# Patient Record
Sex: Female | Born: 1945
Health system: Southern US, Community
[De-identification: ages and names within clinical notes are randomized; demographics above are authoritative.]

## PROBLEM LIST (undated history)

## (undated) DIAGNOSIS — C50919 Malignant neoplasm of unspecified site of unspecified female breast: Secondary | ICD-10-CM

## (undated) DIAGNOSIS — B029 Zoster without complications: Secondary | ICD-10-CM

## (undated) DIAGNOSIS — L910 Hypertrophic scar: Secondary | ICD-10-CM

## (undated) DIAGNOSIS — C801 Malignant (primary) neoplasm, unspecified: Secondary | ICD-10-CM

## (undated) DIAGNOSIS — K219 Gastro-esophageal reflux disease without esophagitis: Secondary | ICD-10-CM

## (undated) HISTORY — DX: Zoster without complications: B02.9

## (undated) HISTORY — DX: Malignant neoplasm of unspecified site of unspecified female breast: C50.919

## (undated) HISTORY — DX: Hypertrophic scar: L91.0

## (undated) HISTORY — PX: COLONOSCOPY: SHX174

---

## 1994-04-20 HISTORY — PX: ABDOMINAL HYSTERECTOMY: SHX81

## 2016-04-20 DIAGNOSIS — C50919 Malignant neoplasm of unspecified site of unspecified female breast: Secondary | ICD-10-CM

## 2016-04-20 HISTORY — DX: Malignant neoplasm of unspecified site of unspecified female breast: C50.919

## 2016-04-20 HISTORY — PX: MASTECTOMY: SHX3

## 2016-04-22 ENCOUNTER — Encounter: Payer: Self-pay | Admitting: Primary Care

## 2016-04-22 ENCOUNTER — Ambulatory Visit (INDEPENDENT_AMBULATORY_CARE_PROVIDER_SITE_OTHER): Payer: Medicare HMO | Admitting: Primary Care

## 2016-04-22 VITALS — BP 136/82 | HR 66 | Temp 98.0°F | Ht 61.25 in | Wt 231.4 lb

## 2016-04-22 DIAGNOSIS — L910 Hypertrophic scar: Secondary | ICD-10-CM | POA: Diagnosis not present

## 2016-04-22 DIAGNOSIS — R229 Localized swelling, mass and lump, unspecified: Secondary | ICD-10-CM

## 2016-04-22 NOTE — Patient Instructions (Signed)
The spot under your right arm is called a Keloid. This is nothing to worry about and is benign.  The spot on your leg is most likely a benign cyst.   Keep an eye on both spots and notify me if the change shape, size, colors.  Please schedule a physical with me within the next 3 months. You may also schedule a lab only appointment 3-4 days prior. We will discuss your lab results in detail during your physical.  It was a pleasure to meet you today! Please don't hesitate to call me with any questions. Welcome to [facility]!

## 2016-04-22 NOTE — Progress Notes (Signed)
Pre visit review using our clinic review tool, if applicable. No additional management support is needed unless otherwise documented below in the visit note. 

## 2016-04-22 NOTE — Progress Notes (Signed)
Subjective:    Patient ID: Monica Villanueva, female    DOB: November 18, 1945, 71 y.o.   MRN: [id number]  HPI  Ms. Montjoy is a 71 year old female who presents today to establish care and discuss the problems mentioned below. Will obtain old records. Her last physical was 10 years ago.   1) Lower Extremity Knot: Pain with knot that has been present for the past 2 years. She's noticed a knot to her right anterior lower extremity just distal to her knee. Several years ago she hit that spot with a cabinet, but didn't notice the knot for several years later. She has not been bothered by this knot overall but has noticed discomfort off and on for the past several years.   2) Skin Mass: Located to right axilla that she first noticed 2 years ago. She's had discomfort intermittently. She's noticed a gradual increase in size. She's also noticed the mass becoming a lighter color over the years. She denies changes in shape or red/blue discoloration. She applies a cream intermittently.   Review of Systems  Constitutional: Negative for fatigue and unexpected weight change.  Musculoskeletal: Negative for myalgias.  Skin: Positive for color change. Negative for rash.       Growth under right axilla, knot to right lower extremity.       No past medical history on file.   Social History   Social History  . Marital status: Widowed    Spouse name: N/A  . Number of children: N/A  . Years of education: N/A   Occupational History  . Not on file.   Social History Main Topics  . Smoking status: Never Smoker  . Smokeless tobacco: Not on file  . Alcohol use Yes  . Drug use: Unknown  . Sexual activity: Not on file   Other Topics Concern  . Not on file   Social History Narrative   Widowed.   2 children, 4 grandchildren.   Retired. Previously worked at [employer].   Enjoys writing, playing on the key boards.    Past Surgical History:  Procedure Laterality Date  . ABDOMINAL HYSTERECTOMY   1996    Family History  Problem Relation Age of Onset  . Hypertension Mother   . Hypertension Sister     No Known Allergies  No current outpatient prescriptions on file prior to visit.   No current facility-administered medications on file prior to visit.     BP 136/82   Pulse 66   Temp 98 F (36.7 C) (Oral)   Ht 5' 1.25" (1.556 m)   Wt 231 lb 6.4 oz (105 kg)   SpO2 93%   BMI 43.37 kg/m    Objective:   Physical Exam  Constitutional: She is oriented to person, place, and time. She appears well-nourished.  Neck: Neck supple.  Cardiovascular: Normal rate and regular rhythm.   Pulmonary/Chest: Effort normal and breath sounds normal.  Neurological: She is alert and oriented to person, place, and time.  Skin: Skin is warm and dry.  4 cm in length light Reddick keloid to right medial axilla. Non tender. No erythema. 1 cm circular superficial, mobile, encapsuled, soft mass representing benign cyst to right lower extremity distal to knee.          Assessment & Plan:  Keloid:  Mass to right axilla x several years. Gradual enlargement over the years, no recent changes. Changes in color after application of cortisone cream. Exam today consistent for Keloid.  Discussed this is a benign finding and nothing to worry about. Discussed to notify if any changes in size, shape, color.  Skin Mass:  Located to right lower extremity. No change in cyst for years. Exam today consistent for benign cyst. Discussed to notify for any changes. She verbalized understanding.  Sheral Flow, NP

## 2016-05-13 ENCOUNTER — Encounter: Payer: Self-pay | Admitting: Primary Care

## 2016-05-13 ENCOUNTER — Ambulatory Visit (INDEPENDENT_AMBULATORY_CARE_PROVIDER_SITE_OTHER): Payer: Medicare HMO | Admitting: Primary Care

## 2016-05-13 VITALS — BP 134/80 | HR 72 | Temp 97.8°F | Ht 61.25 in | Wt 227.8 lb

## 2016-05-13 DIAGNOSIS — Z23 Encounter for immunization: Secondary | ICD-10-CM

## 2016-05-13 DIAGNOSIS — Z1231 Encounter for screening mammogram for malignant neoplasm of breast: Secondary | ICD-10-CM

## 2016-05-13 DIAGNOSIS — Z0000 Encounter for general adult medical examination without abnormal findings: Secondary | ICD-10-CM | POA: Diagnosis not present

## 2016-05-13 DIAGNOSIS — E2839 Other primary ovarian failure: Secondary | ICD-10-CM | POA: Diagnosis not present

## 2016-05-13 DIAGNOSIS — Z1159 Encounter for screening for other viral diseases: Secondary | ICD-10-CM | POA: Diagnosis not present

## 2016-05-13 DIAGNOSIS — Z6841 Body Mass Index (BMI) 40.0 and over, adult: Secondary | ICD-10-CM

## 2016-05-13 DIAGNOSIS — Z1239 Encounter for other screening for malignant neoplasm of breast: Secondary | ICD-10-CM

## 2016-05-13 LAB — COMPREHENSIVE METABOLIC PANEL
ALK PHOS: 58 U/L (ref 39–117)
ALT: 10 U/L (ref 0–35)
AST: 15 U/L (ref 0–37)
Albumin: 4.3 g/dL (ref 3.5–5.2)
BILIRUBIN TOTAL: 0.5 mg/dL (ref 0.2–1.2)
BUN: 12 mg/dL (ref 6–23)
CALCIUM: 9.3 mg/dL (ref 8.4–10.5)
CO2: 30 mEq/L (ref 19–32)
Chloride: 106 mEq/L (ref 96–112)
Creatinine, Ser: 0.84 mg/dL (ref 0.40–1.20)
GFR: 86.07 mL/min (ref >60.00)
Glucose, Bld: 109 mg/dL — ABNORMAL HIGH (ref 70–99)
Potassium: 3.7 mEq/L (ref 3.5–5.1)
Sodium: 141 mEq/L (ref 135–145)
TOTAL PROTEIN: 8 g/dL (ref 6.0–8.3)

## 2016-05-13 LAB — LIPID PANEL
CHOLESTEROL: 217 mg/dL — AB (ref 0–200)
HDL: 48.5 mg/dL (ref >39.00)
LDL Cholesterol: 157 mg/dL — ABNORMAL HIGH (ref 0–99)
NonHDL: 168.04
TRIGLYCERIDES: 55 mg/dL (ref 0.0–149.0)
Total CHOL/HDL Ratio: 4
VLDL: 11 mg/dL (ref 0.0–40.0)

## 2016-05-13 LAB — HEMOGLOBIN A1C: HEMOGLOBIN A1C: 5.5 % (ref 4.6–6.5)

## 2016-05-13 NOTE — Assessment & Plan Note (Signed)
She's working on making improvements in diet. Encouraged her to continue exercising. Check A1C and lipids today.

## 2016-05-13 NOTE — Assessment & Plan Note (Signed)
Declines all vaccinations including influenza, pneumonia, zostavax. Discussed the importance of all immunizations. Mammogram and bone density testing due, ordered. Due for colon cancer screening, she was signed up for Cologuard today. Overall fair diet, discussed to make improvements; continue exercising. Exam unremarkable, labs pending.   I have personally reviewed and have noted: 1. The patient's medical and social history 2. Their use of alcohol, tobacco or illicit drugs 3. Their current medications and supplements 4. The patient's functional ability including ADL's, fall  risks, home safety risks and hearing or visual  impairment. 5. Diet and physical activities 6. Evidence for depression or mood disorder

## 2016-05-13 NOTE — Progress Notes (Signed)
Patient ID: Monica Villanueva, female   DOB: Aug 20, 1945, 71 y.o.   MRN: [id number]  HPI: [name] presents today for her annual wellness visit.  Past Medical History:  Diagnosis Date  . Keloid of skin    right axilla    No current outpatient prescriptions on file.   No current facility-administered medications for this visit.     No Known Allergies  Family History  Problem Relation Age of Onset  . Hypertension Mother   . Hypertension Sister     Social History   Social History  . Marital status: Widowed    Spouse name: N/A  . Number of children: N/A  . Years of education: N/A   Occupational History  . Not on file.   Social History Main Topics  . Smoking status: Never Smoker  . Smokeless tobacco: Not on file  . Alcohol use Yes  . Drug use: Unknown  . Sexual activity: Not on file   Other Topics Concern  . Not on file   Social History Narrative   Widowed.   2 children, 4 grandchildren.   Retired. Previously worked at [employer].   Enjoys writing, playing on the key boards.    Hospitiliaztions: None  Health Maintenance:    Flu: Declines  Tetanus: Completed over 10 years ago  Pneumovax: Never completed  Prevnar: Never completed  Zostavax: Declines  Bone Density: Never completed  Colonoscopy: Completed over 10 years ago, interested in Cologuard.  Eye Doctor: Completed in 2012  Dental Exam: Completes every 1-2 years, due soon.  Mammogram: Has not completed recommended   Pap: Hysterectomy   Hep C Screening: Never completed     Providers: Alma Friendly, PCP.   I have personally reviewed and have noted: 1. The patient's medical and social history 2. Their use of alcohol, tobacco or illicit drugs 3. Their current medications and supplements 4. The patient's functional ability including ADL's, fall risks, home safety risks  and hearing or visual impairment. 5. Diet and physical activities 6. Evidence for depression or mood  disorder  Subjective:   Review of Systems:   Constitutional: Denies fever, malaise, fatigue, headache or abrupt weight changes.  HEENT: Denies eye pain, eye redness, ear pain, ringing in the ears, wax buildup, runny nose, nasal congestion, bloody nose, or sore throat. Respiratory: Denies difficulty breathing, shortness of breath, cough or sputum production.   Cardiovascular: Denies chest pain, chest tightness, palpitations or swelling in the hands or feet.  Gastrointestinal: Denies abdominal pain, bloating, constipation, diarrhea or blood in the stool.  GU: Denies urgency, frequency, pain with urination, burning sensation, blood in urine, odor or discharge. Musculoskeletal: Denies decrease in range of motion, difficulty with gait, muscle pain or joint pain and swelling.  Skin: Denies redness, rashes, lesions or ulcercations.  Neurological: Denies dizziness, difficulty with memory, difficulty with speech or problems with balance and coordination.  Psychiatric: Denies concerns for anxiety or depression.   No other specific complaints in a complete review of systems (except as listed in HPI above).  Objective:  PE:   BP 134/80   Pulse 72   Temp 97.8 F (36.6 C) (Oral)   Ht 5' 1.25" (1.556 m)   Wt 227 lb 12.8 oz (103.3 kg)   SpO2 93%   BMI 42.69 kg/m  Wt Readings from Last 3 Encounters:  05/13/16 227 lb 12.8 oz (103.3 kg)  04/22/16 231 lb 6.4 oz (105 kg)    General: Appears their stated age, well developed, well  nourished in NAD. Skin: Warm, dry and intact. No rashes, lesions or ulcerations noted. HEENT: Head: normal shape and size; Eyes: sclera white, no icterus, conjunctiva pink, PERRLA and EOMs intact; Ears: Tm's gray and intact, normal light reflex; Nose: mucosa pink and moist, septum midline; Throat/Mouth: Teeth present, mucosa pink and moist, no exudate, lesions or ulcerations noted.  Neck: Normal range of motion. Neck supple, trachea midline. No massses, lumps or  thyromegaly present.  Cardiovascular: Normal rate and rhythm. S1,S2 noted.  No murmur, rubs or gallops noted. No JVD or BLE edema. No carotid bruits noted. Pulmonary/Chest: Normal effort and positive vesicular breath sounds. No respiratory distress. No wheezes, rales or ronchi noted.  Abdomen: Soft and nontender. Normal bowel sounds, no bruits noted. No distention or masses noted. Liver, spleen and kidneys non palpable. Musculoskeletal: Normal range of motion. No signs of joint swelling. No difficulty with gait.  Neurological: Alert and oriented. Cranial nerves II-XII intact. Coordination normal. +DTRs bilaterally. Psychiatric: Mood and affect normal. Behavior is normal. Judgment and thought content normal.    BMET No results found for: NA, K, CL, CO2, GLUCOSE, BUN, CREATININE, CALCIUM, GFRNONAA, GFRAA  Lipid Panel  No results found for: CHOL, TRIG, HDL, CHOLHDL, VLDL, LDLCALC  CBC No results found for: WBC, RBC, HGB, HCT, PLT, MCV, MCH, MCHC, RDW, LYMPHSABS, MONOABS, EOSABS, BASOSABS  Hgb A1C No results found for: HGBA1C    Assessment and Plan:   Medicare Annual Wellness Visit:  Diet: She endorses a fair diet Breakfast: Skips, boiled egg, fruit Lunch: Salads with protein, baked chicken Dinner: Baked/broiled lean meats, limited starches Snacks: Rarely Desserts: Rarely  Beverages: Coffee, water, occasional sodas Physical activity: Active. She works out on her chair gym several days weekly. Depression/mood screen: Negative Hearing: Intact to whispered voice Visual acuity: Grossly normal, performs annual eye exam  ADLs: Capable Fall risk: None Home safety: Good Cognitive evaluation: Intact to orientation, naming, recall and repetition EOL planning: Adv directives not completed, daughter [name] would be [employer].  Preventative Medicine: Declines all vaccinations including influenza, pneumonia, zostavax. Discussed the importance of all immunizations. Mammogram and bone density  testing due, ordered. Due for colon cancer screening, she was signed up for Cologuard today. Overall fair diet, discussed to make improvements; continue exercising. Exam unremarkable, labs pending.   Next appointment: Follow up in 1 year.

## 2016-05-13 NOTE — Progress Notes (Signed)
Pre visit review using our clinic review tool, if applicable. No additional management support is needed unless otherwise documented below in the visit note. 

## 2016-05-13 NOTE — Addendum Note (Signed)
Addended by: Jacqualin Combes on: 05/13/2016 10:09 AM   Modules accepted: Orders

## 2016-05-13 NOTE — Patient Instructions (Signed)
Complete lab work prior to leaving today. I will notify you of your results once received.   Call and schedule your mammogram and bone density through the [facility] at [place] your efforts towards a healthy diet. Increase vegetables, fruit, whole grains, water.  Continue exercising. You should be getting 150 minutes of moderate intensity exercise weekly.  We will notify you of the Cologurad (cancer screening) test once received.  I recommend an annual flu shot and the pneumonia shot series. Please think about these.  Follow up in 1 year for an annual physical or sooner if needed.  It was a pleasure to see you today!

## 2016-05-14 LAB — HEPATITIS C ANTIBODY: HCV AB: NEGATIVE

## 2016-05-19 ENCOUNTER — Encounter: Payer: Self-pay | Admitting: Primary Care

## 2016-05-19 DIAGNOSIS — Z1212 Encounter for screening for malignant neoplasm of rectum: Secondary | ICD-10-CM | POA: Diagnosis not present

## 2016-05-19 DIAGNOSIS — Z1211 Encounter for screening for malignant neoplasm of colon: Secondary | ICD-10-CM | POA: Diagnosis not present

## 2016-05-19 LAB — COLOGUARD: Cologuard: NEGATIVE

## 2016-05-27 ENCOUNTER — Telehealth: Payer: Self-pay | Admitting: Primary Care

## 2016-05-27 NOTE — Telephone Encounter (Signed)
Please notify patient that her Cologuard specimen was negative and not suspicious for colon cancer. We will repeat this in 3 years.

## 2016-05-28 NOTE — Telephone Encounter (Signed)
Sending letter with results and Kate's comments for patient. 

## 2016-06-23 ENCOUNTER — Ambulatory Visit
Admission: RE | Admit: 2016-06-23 | Discharge: 2016-06-23 | Disposition: A | Discharge status: Home / Self Care | Payer: Medicare HMO | Source: Ambulatory Visit | Attending: Primary Care | Admitting: Primary Care

## 2016-06-23 DIAGNOSIS — M8588 Other specified disorders of bone density and structure, other site: Secondary | ICD-10-CM | POA: Diagnosis not present

## 2016-06-23 DIAGNOSIS — R928 Other abnormal and inconclusive findings on diagnostic imaging of breast: Secondary | ICD-10-CM | POA: Diagnosis not present

## 2016-06-23 DIAGNOSIS — Z1239 Encounter for other screening for malignant neoplasm of breast: Secondary | ICD-10-CM

## 2016-06-23 DIAGNOSIS — E2839 Other primary ovarian failure: Secondary | ICD-10-CM | POA: Diagnosis present

## 2016-06-23 DIAGNOSIS — Z1231 Encounter for screening mammogram for malignant neoplasm of breast: Secondary | ICD-10-CM | POA: Diagnosis not present

## 2016-06-23 DIAGNOSIS — M85851 Other specified disorders of bone density and structure, right thigh: Secondary | ICD-10-CM | POA: Diagnosis not present

## 2016-06-24 ENCOUNTER — Other Ambulatory Visit: Payer: Self-pay | Admitting: Primary Care

## 2016-06-24 DIAGNOSIS — N6320 Unspecified lump in the left breast, unspecified quadrant: Secondary | ICD-10-CM

## 2016-06-24 DIAGNOSIS — R921 Mammographic calcification found on diagnostic imaging of breast: Secondary | ICD-10-CM

## 2016-06-24 DIAGNOSIS — R928 Other abnormal and inconclusive findings on diagnostic imaging of breast: Secondary | ICD-10-CM

## 2016-06-24 DIAGNOSIS — N6310 Unspecified lump in the right breast, unspecified quadrant: Secondary | ICD-10-CM

## 2016-07-10 ENCOUNTER — Ambulatory Visit
Admission: RE | Admit: 2016-07-10 | Discharge: 2016-07-10 | Disposition: A | Discharge status: Home / Self Care | Payer: Medicare HMO | Source: Ambulatory Visit | Attending: Primary Care | Admitting: Primary Care

## 2016-07-10 ENCOUNTER — Other Ambulatory Visit: Payer: Self-pay | Admitting: Primary Care

## 2016-07-10 DIAGNOSIS — N6310 Unspecified lump in the right breast, unspecified quadrant: Secondary | ICD-10-CM

## 2016-07-10 DIAGNOSIS — R928 Other abnormal and inconclusive findings on diagnostic imaging of breast: Secondary | ICD-10-CM

## 2016-07-10 DIAGNOSIS — N6312 Unspecified lump in the right breast, upper inner quadrant: Secondary | ICD-10-CM | POA: Diagnosis not present

## 2016-07-10 DIAGNOSIS — R921 Mammographic calcification found on diagnostic imaging of breast: Secondary | ICD-10-CM | POA: Insufficient documentation

## 2016-07-10 DIAGNOSIS — N6001 Solitary cyst of right breast: Secondary | ICD-10-CM | POA: Insufficient documentation

## 2016-07-10 DIAGNOSIS — N6002 Solitary cyst of left breast: Secondary | ICD-10-CM | POA: Diagnosis not present

## 2016-07-10 DIAGNOSIS — N6320 Unspecified lump in the left breast, unspecified quadrant: Secondary | ICD-10-CM

## 2016-07-10 DIAGNOSIS — R920 Mammographic microcalcification found on diagnostic imaging of breast: Secondary | ICD-10-CM

## 2016-07-15 ENCOUNTER — Ambulatory Visit
Admission: RE | Admit: 2016-07-15 | Discharge: 2016-07-15 | Disposition: A | Discharge status: Home / Self Care | Payer: Medicare HMO | Source: Ambulatory Visit | Attending: Primary Care | Admitting: Primary Care

## 2016-07-15 DIAGNOSIS — C50911 Malignant neoplasm of unspecified site of right female breast: Secondary | ICD-10-CM | POA: Insufficient documentation

## 2016-07-15 DIAGNOSIS — N6310 Unspecified lump in the right breast, unspecified quadrant: Secondary | ICD-10-CM

## 2016-07-15 DIAGNOSIS — R928 Other abnormal and inconclusive findings on diagnostic imaging of breast: Secondary | ICD-10-CM

## 2016-07-15 DIAGNOSIS — N6312 Unspecified lump in the right breast, upper inner quadrant: Secondary | ICD-10-CM | POA: Diagnosis not present

## 2016-07-15 DIAGNOSIS — R920 Mammographic microcalcification found on diagnostic imaging of breast: Secondary | ICD-10-CM | POA: Diagnosis not present

## 2016-07-15 DIAGNOSIS — Z170 Estrogen receptor positive status [ER+]: Secondary | ICD-10-CM | POA: Diagnosis not present

## 2016-07-15 DIAGNOSIS — R921 Mammographic calcification found on diagnostic imaging of breast: Secondary | ICD-10-CM | POA: Diagnosis not present

## 2016-07-15 DIAGNOSIS — N6002 Solitary cyst of left breast: Secondary | ICD-10-CM

## 2016-07-15 DIAGNOSIS — C50211 Malignant neoplasm of upper-inner quadrant of right female breast: Secondary | ICD-10-CM | POA: Diagnosis not present

## 2016-07-15 DIAGNOSIS — C50511 Malignant neoplasm of lower-outer quadrant of right female breast: Secondary | ICD-10-CM | POA: Diagnosis not present

## 2016-07-15 HISTORY — PX: BREAST BIOPSY: SHX20

## 2016-07-15 HISTORY — PX: BREAST CYST ASPIRATION: SHX578

## 2016-07-21 ENCOUNTER — Encounter: Payer: Self-pay | Admitting: *Deleted

## 2016-07-21 NOTE — Progress Notes (Signed)
  Oncology Nurse Navigator Documentation  Navigator Location: [facility] (07/21/16 1500)   )Navigator Encounter Type: Introductory phone call (07/21/16 1500)   Abnormal Finding Date: 07/10/16 (07/21/16 1500) Confirmed Diagnosis Date: 07/15/16 (07/21/16 1500)                   Barriers/Navigation Needs: Education;Coordination of Care (07/21/16 1500)   Interventions: Coordination of Care (07/21/16 1500)    Called patient to establish navigation services and offer coordination of care.  I have scheduled her to see Dr. Tamala Julian for surgical consultation on 07/23/16 @8 :35 and Dr. Janese Banks for med/onc consultation on 07/31/16 @ 1:30.  I will take educational literature to be picked up at her appointment on the 5th.  She is agreeable.  She is to call if she has any questions or needs.        Acuity: Level 2 (07/21/16 1500)         Time Spent with Patient: 45 (07/21/16 1500)

## 2016-07-23 ENCOUNTER — Encounter: Payer: Self-pay | Admitting: *Deleted

## 2016-07-23 DIAGNOSIS — C50811 Malignant neoplasm of overlapping sites of right female breast: Secondary | ICD-10-CM | POA: Diagnosis not present

## 2016-07-23 NOTE — Progress Notes (Signed)
  Oncology Nurse Navigator Documentation  Navigator Location: [facility] (07/23/16 0900)   )Navigator Encounter Type: Other (07/23/16 0900)                         Barriers/Navigation Needs: Education (07/23/16 0900) Education: Understanding Cancer/ Treatment Options;Coping with Diagnosis/ Prognosis;Newly Diagnosed Cancer Education (07/23/16 0900)              Acuity: Level 2 (07/23/16 0900)    Met patient her daughter and son-in-law prior to her appointment with Dr. Tamala Julian.  Gave patient breast cancer educational literature, "My Breast Cancer Treatment Handbook" by Josephine Igo, RN.  Offered support.  She is to call if she has any questions or needs.     Time Spent with Patient: 30 (07/23/16 0900)

## 2016-07-24 ENCOUNTER — Encounter: Payer: Self-pay | Admitting: *Deleted

## 2016-07-24 NOTE — Progress Notes (Signed)
  Oncology Nurse Navigator Documentation  Navigator Location: [facility] (07/24/16 0900)   )Navigator Encounter Type: Telephone (07/24/16 0900) Telephone: Lahoma Crocker Call (07/24/16 0900)                                        Acuity: Level 2 (07/24/16 0900)    Called patient to follow-up after her surgical consultation.  No questions at this time.  She has medical oncology consult on 07/31/16 with Dr. Janese Banks.  Her2 status is still pending.  She is to call with any questions or needs.     Time Spent with Patient: 15 (07/24/16 0900)

## 2016-07-27 LAB — SURGICAL PATHOLOGY

## 2016-07-31 ENCOUNTER — Inpatient Hospital Stay: Payer: Medicare HMO | Attending: Oncology | Admitting: Oncology

## 2016-07-31 ENCOUNTER — Encounter: Payer: Self-pay | Admitting: Oncology

## 2016-07-31 VITALS — BP 167/83 | HR 64 | Temp 97.6°F | Resp 18 | Ht 61.0 in | Wt 229.3 lb

## 2016-07-31 DIAGNOSIS — C50911 Malignant neoplasm of unspecified site of right female breast: Secondary | ICD-10-CM

## 2016-07-31 DIAGNOSIS — Z170 Estrogen receptor positive status [ER+]: Secondary | ICD-10-CM | POA: Insufficient documentation

## 2016-07-31 DIAGNOSIS — Z6841 Body Mass Index (BMI) 40.0 and over, adult: Secondary | ICD-10-CM | POA: Diagnosis not present

## 2016-07-31 DIAGNOSIS — C50211 Malignant neoplasm of upper-inner quadrant of right female breast: Secondary | ICD-10-CM | POA: Diagnosis not present

## 2016-07-31 NOTE — Progress Notes (Signed)
Patient is here for new patient appointment, she has a list of concerns.  What kind of treatment will she receive? What schedule will she be on? How long will treatments be? How long will each treatment be? Will she need radiation therapy?

## 2016-07-31 NOTE — Progress Notes (Signed)
Hematology/Oncology Consult note [facility] [phone] [phone]  Patient Care Team: Pleas Koch, NP as PCP - General (Internal Medicine)   Name of the patient: Monica Villanueva  [id number]  1945-06-14    Reason for referral- multifocal right breast cancer   Referring physician- Dr. Alma Villanueva  Date of visit: 07/31/16   History of presenting illness- 1. Patient is a 71 year old female who underwent bilateral screening mammogram on 36 MT 18 which showed masses in bilateral breasts requiring further evaluation.  2. Diagnostic bilateral mammogram on 07/10/2016 showed:IMPRESSION:1. Findings suspicious for multicentric carcinoma in the right breast. There is a suspicious irregular mass in the upper inner quadrant at 2 o'clock position 7 cm from the nipple and there are extensive pleomorphic calcifications in the outer right breast, involving both the lower outer quadrant and upper outer quadrant. 2. Probable fibroadenoma 10:30 position upper-outer quadrant right breast. 3. Probable complicated cyst 3 o'clock retroareolar left breast.4. Probable sebaceous cyst left axilla.  3. Patient underwent core biopsy of both the right breast lesions as well as ultrasound-guided aspiration of left breast cyst. Pathology showed:  DIAGNOSIS:  A. BREAST, RIGHT, LOWER OUTER QUADRANT; STEREOTACTIC-GUIDED CORE BIOPSY:  - MICROINVASIVE LOBULAR CARCINOMA, 0.95 MM.  - DUCTAL CARCINOMA IN SITU, INTERMEDIATE NUCLEAR GRADE WITH FOCAL  NECROSIS, ASSOCIATED WITH CALCIFICATIONS.   Comment:  A single 0.95 mm focus of invasive lobular carcinoma, classic type, is  identified and confirmed by cytokeratin immunohistochemistry with  appropriately stained control. The tumor is arising in association with  DCIS which is present in several core fragments and probably represents  an extensive intraductal component. Classic invasive lobular carcinoma  is essentially  always ER positive and HER2 negative, and the small size  is associated with very low risk. Given the concurrent larger tumor,  biomarkers are performed only on part B at this time. If a larger  invasive lobular component is discovered at a later time, biomarkers can  be done as clinically indicated.   B. BREAST, RIGHT, 2 O'CLOCK, 7 CM FROM NIPPLE; ULTRASOUND-GUIDED CORE  BIOPSY:  - INVASIVE MAMMARY CARCINOMA, NO SPECIAL TYPE.   The following refers to tumor B only:  Size of invasive carcinoma: 13 mm  Histologic grade of invasive carcinoma: Grade 1    Glandular/tubular differentiation score: 2    Nuclear pleomorphism score: 2    Mitotic rate score: 1    Total score: 5   Ductal carcinoma in situ: Not identified  Lymphovascular invasion: Not identified  ER/PR/HER2: See below.   Comment:  The definitive grade on part B will be assigned on the excisional  specimen.  The findings on A and B were communicated to Monica Villanueva in the  [facility] on 07/16/16 at 11:25 AM. Read-back procedure  was performed.   HER2 FISH has been ordered on B1 because of equivocal IHC result and  will be reported in an addendum.   Breast Biomarker Reporting Template: ER, PR, HER2   BREAST BIOMARKER TESTS  Estrogen Receptor (ER) Status: POSITIVE, >90% nuclear staining  Progesterone Receptor (PgR) Status: POSITIVE, >90% nuclear staining  HER2 (by immunohistochemistry): Equivocal (Score 2+)  Percentage of cells with uniform intense complete membrane staining: 5%  FISH for her2 negative  4. At baseline patient is in good health and reports no significant comorbidities. She does not take any other medications except multivitamins. No family history of breast cancer. Menarche at the age of 29 and menopause in 9. She did not  breast-feed and used oral contraceptive pills remotely. She is G2 P2 L2.  ECOG PS- 0  Pain scale- 0   Review of systems- Review of Systems    Constitutional: Negative for chills, fever, malaise/fatigue and weight loss.  HENT: Negative for congestion, ear discharge and nosebleeds.   Eyes: Negative for blurred vision.  Respiratory: Negative for cough, hemoptysis, sputum production, shortness of breath and wheezing.   Cardiovascular: Negative for chest pain, palpitations, orthopnea and claudication.  Gastrointestinal: Negative for abdominal pain, blood in stool, constipation, diarrhea, heartburn, melena, nausea and vomiting.  Genitourinary: Negative for dysuria, flank pain, frequency, hematuria and urgency.  Musculoskeletal: Negative for back pain, joint pain and myalgias.  Skin: Negative for rash.  Neurological: Negative for dizziness, tingling, focal weakness, seizures, weakness and headaches.  Endo/Heme/Allergies: Does not bruise/bleed easily.  Psychiatric/Behavioral: Negative for depression and suicidal ideas. The patient does not have insomnia.     No Known Allergies  Patient Active Problem List   Diagnosis Date Noted  . Medicare annual wellness visit, subsequent 05/13/2016  . Morbid obesity with BMI of 40.0-44.9, adult ([place]) 05/13/2016     Past Medical History:  Diagnosis Date  . Keloid of skin    right axilla     Past Surgical History:  Procedure Laterality Date  . ABDOMINAL HYSTERECTOMY  1996  . BREAST BIOPSY Right 07/15/2016   stereo bx of calcs LOQ-path pending  . BREAST BIOPSY Right 07/15/2016   [place] bx of mass-path pending  . BREAST CYST ASPIRATION Left 07/15/2016    Social History   Social History  . Marital status: Widowed    Spouse name: N/A  . Number of children: N/A  . Years of education: N/A   Occupational History  . Not on file.   Social History Main Topics  . Smoking status: Never Smoker  . Smokeless tobacco: Never Used  . Alcohol use Yes  . Drug use: Unknown  . Sexual activity: Not on file   Other Topics Concern  . Not on file   Social History Narrative   Widowed.   2  children, 4 grandchildren.   Retired. Previously worked at [employer].   Enjoys writing, playing on the key boards.     Family History  Problem Relation Age of Onset  . Hypertension Mother   . Hypertension Sister   . Breast cancer Neg Hx     No current outpatient prescriptions on file.   Physical exam:  Vitals:   07/31/16 1406  BP: (!) 167/83  Pulse: 64  Resp: 18  Temp: 97.6 F (36.4 C)  TempSrc: Tympanic  SpO2: 96%  Weight: 229 lb 4.8 oz (104 kg)  Height: 5' 1"  (1.549 m)   Physical Exam  Constitutional: She is oriented to person, place, and time and well-developed, well-nourished, and in no distress.  HENT:  Head: Normocephalic and atraumatic.  Eyes: EOM are normal. Pupils are equal, round, and reactive to light.  Neck: Normal range of motion.  Cardiovascular: Normal rate, regular rhythm and normal heart sounds.   Pulmonary/Chest: Effort normal and breath sounds normal.  Abdominal: Soft. Bowel sounds are normal.  Neurological: She is alert and oriented to person, place, and time.  Skin: Skin is warm and dry.   Breast exam was performed in seated and lying down position. Left side- No evidence of any palpable masses. No evidence of axillary adenopathy. Old keloid scar noted over left axilla. No evidence of any palpable masses or lumps in the right  breast. No evidence of right axillary adenopathy     CMP Latest Ref Rng & Units 05/13/2016  Glucose 70 - 99 mg/dL 109(H)  BUN 6 - 23 mg/dL 12  Creatinine 0.40 - 1.20 mg/dL 0.84  Sodium 135 - 145 mEq/L 141  Potassium 3.5 - 5.1 mEq/L 3.7  Chloride 96 - 112 mEq/L 106  CO2 19 - 32 mEq/L 30  Calcium 8.4 - 10.5 mg/dL 9.3  Total Protein 6.0 - 8.3 g/dL 8.0  Total Bilirubin 0.2 - 1.2 mg/dL 0.5  Alkaline Phos 39 - 117 U/L 58  AST 0 - 37 U/L 15  ALT 0 - 35 U/L 10   No flowsheet data found.  No images are attached to the encounter.  US Breast Ltd Uni Left Inc Axilla  Result Date: 07/10/2016 CLINICAL  DATA:  71 year old patient recalled from recent screening mammogram for masses and calcifications in the right breast and a mass in the left breast. EXAM: 2D DIGITAL DIAGNOSTIC BILATERAL MAMMOGRAM WITH CAD AND ADJUNCT TOMO ULTRASOUND BILATERAL BREAST COMPARISON:  Screening mammogram 06/23/2016 ACR Breast Density Category b: There are scattered areas of fibroglandular density. FINDINGS: Additional mammographic views of the right breast confirm an irregular mass in the upper inner quadrant of the right breast middle to posterior thirds, measuring up to 1.8 cm. Centered in the lower outer quadrant, and extending into the upper-outer quadrant are numerous pleomorphic microcalcifications. Several magnification images were obtained for evaluation of these calcifications. There is some respiratory motion on some of the images. The calcifications span approximately 7.6 cm anterior posterior diameter by 6 cm superior to inferior and approximately 3.5 cm transverse diameter. Additionally, there is a circumscribed oval mass in the upper-outer quadrant of the right breast measuring approximately 1.2 cm. In the retroareolar outer left breast is a circumscribed oval mass measuring approximately 1.2 cm. Mammographic images were processed with CAD. On physical exam, there is minimal focal nodular thickening in the 2 o'clock region the right breast approximately 7 cm from the nipple. In the left breast, there is a smooth palpable lump in the retroareolar region at 3 o'clock position. Targeted ultrasound is performed, showing in the right breast at 2 o'clock position 7 cm from the nipple is a hypoechoic irregular mass with marked posterior acoustic shadowing that spans 1.5 x 1.2 x 0.9 cm. This corresponds to the irregular mass seen medially on the mammogram. In the right breast at 10:30 position 5 cm from the nipple of is a circumscribed oval homogeneously hypoechoic solid mass measuring 1.5 x 0.8 x 1.2 cm. This is likely a benign  fibroadenoma. There been no priors for comparison. Ultrasound of the retroareolar LEFT breast shows a simple cyst at 3 o'clock position 1 cm from the nipple measuring 1.4 x 1.3 x 0.7 cm. Adjacent to this is a second oval circumscribed hypoechoic mass measuring 0.9 x 1.0 x 0.7 cm. There is an adjacent blood vessel, but no internal blood flow. There is posterior acoustic enhancement. This is most likely a complicated cyst. Ultrasound of the left axilla shows a complex fluid dermal collection underlying a small bump on the skin. This fluid collection measures 1.2 x 0.7 x 1.0 cm and is most likely a sebaceous cyst. No lymphadenopathy is detected in either the left or right axilla. IMPRESSION: 1. Findings suspicious for multicentric carcinoma in the right breast. There is a suspicious irregular mass in the upper inner quadrant at 2 o'clock position 7 cm from the nipple and there are extensive pleomorphic calcifications  in the outer right breast, involving both the lower outer quadrant and upper outer quadrant. 2. Probable fibroadenoma 10:30 position upper-outer quadrant right breast. 3. Probable complicated cyst 3 o'clock retroareolar left breast. 4. Probable sebaceous cyst left axilla. RECOMMENDATION: 1. Recommend ultrasound-guided biopsy of the suspicious mass in the upper-inner quadrant of the right breast. 2. Recommend stereotactic biopsy of suspicious microcalcifications in the lower outer quadrant of the right breast. 3. Recommend cyst aspiration of the complicated cyst in the 3 o'clock retroareolar left breast. Please note there is an adjacent simple cyst in the same region of the left breast, for which no intervention is suggested. 4. Probable fibroadenoma in the upper-outer quadrant of the right breast for which no intervention is suggested at this time, given the suspicious multicentric findings. I have discussed the findings and recommendations with the patient and her daughter. Results were also provided in  writing at the conclusion of the visit. If applicable, a reminder letter will be sent to the patient regarding the next appointment. BI-RADS CATEGORY  5: Highly suggestive of malignancy. Electronically Signed   By: Curlene Dolphin M.D.   On: 07/10/2016 11:41   US Breast Ltd Uni Right Inc Axilla  Result Date: 07/10/2016 CLINICAL DATA:  71 year old patient recalled from recent screening mammogram for masses and calcifications in the right breast and a mass in the left breast. EXAM: 2D DIGITAL DIAGNOSTIC BILATERAL MAMMOGRAM WITH CAD AND ADJUNCT TOMO ULTRASOUND BILATERAL BREAST COMPARISON:  Screening mammogram 06/23/2016 ACR Breast Density Category b: There are scattered areas of fibroglandular density. FINDINGS: Additional mammographic views of the right breast confirm an irregular mass in the upper inner quadrant of the right breast middle to posterior thirds, measuring up to 1.8 cm. Centered in the lower outer quadrant, and extending into the upper-outer quadrant are numerous pleomorphic microcalcifications. Several magnification images were obtained for evaluation of these calcifications. There is some respiratory motion on some of the images. The calcifications span approximately 7.6 cm anterior posterior diameter by 6 cm superior to inferior and approximately 3.5 cm transverse diameter. Additionally, there is a circumscribed oval mass in the upper-outer quadrant of the right breast measuring approximately 1.2 cm. In the retroareolar outer left breast is a circumscribed oval mass measuring approximately 1.2 cm. Mammographic images were processed with CAD. On physical exam, there is minimal focal nodular thickening in the 2 o'clock region the right breast approximately 7 cm from the nipple. In the left breast, there is a smooth palpable lump in the retroareolar region at 3 o'clock position. Targeted ultrasound is performed, showing in the right breast at 2 o'clock position 7 cm from the nipple is a hypoechoic  irregular mass with marked posterior acoustic shadowing that spans 1.5 x 1.2 x 0.9 cm. This corresponds to the irregular mass seen medially on the mammogram. In the right breast at 10:30 position 5 cm from the nipple of is a circumscribed oval homogeneously hypoechoic solid mass measuring 1.5 x 0.8 x 1.2 cm. This is likely a benign fibroadenoma. There been no priors for comparison. Ultrasound of the retroareolar LEFT breast shows a simple cyst at 3 o'clock position 1 cm from the nipple measuring 1.4 x 1.3 x 0.7 cm. Adjacent to this is a second oval circumscribed hypoechoic mass measuring 0.9 x 1.0 x 0.7 cm. There is an adjacent blood vessel, but no internal blood flow. There is posterior acoustic enhancement. This is most likely a complicated cyst. Ultrasound of the left axilla shows a complex fluid dermal collection underlying  a small bump on the skin. This fluid collection measures 1.2 x 0.7 x 1.0 cm and is most likely a sebaceous cyst. No lymphadenopathy is detected in either the left or right axilla. IMPRESSION: 1. Findings suspicious for multicentric carcinoma in the right breast. There is a suspicious irregular mass in the upper inner quadrant at 2 o'clock position 7 cm from the nipple and there are extensive pleomorphic calcifications in the outer right breast, involving both the lower outer quadrant and upper outer quadrant. 2. Probable fibroadenoma 10:30 position upper-outer quadrant right breast. 3. Probable complicated cyst 3 o'clock retroareolar left breast. 4. Probable sebaceous cyst left axilla. RECOMMENDATION: 1. Recommend ultrasound-guided biopsy of the suspicious mass in the upper-inner quadrant of the right breast. 2. Recommend stereotactic biopsy of suspicious microcalcifications in the lower outer quadrant of the right breast. 3. Recommend cyst aspiration of the complicated cyst in the 3 o'clock retroareolar left breast. Please note there is an adjacent simple cyst in the same region of the left  breast, for which no intervention is suggested. 4. Probable fibroadenoma in the upper-outer quadrant of the right breast for which no intervention is suggested at this time, given the suspicious multicentric findings. I have discussed the findings and recommendations with the patient and her daughter. Results were also provided in writing at the conclusion of the visit. If applicable, a reminder letter will be sent to the patient regarding the next appointment. BI-RADS CATEGORY  5: Highly suggestive of malignancy. Electronically Signed   By: Curlene Dolphin M.D.   On: 07/10/2016 11:41   Mm Diag Breast Tomo Bilateral  Result Date: 07/10/2016 CLINICAL DATA:  71 year old patient recalled from recent screening mammogram for masses and calcifications in the right breast and a mass in the left breast. EXAM: 2D DIGITAL DIAGNOSTIC BILATERAL MAMMOGRAM WITH CAD AND ADJUNCT TOMO ULTRASOUND BILATERAL BREAST COMPARISON:  Screening mammogram 06/23/2016 ACR Breast Density Category b: There are scattered areas of fibroglandular density. FINDINGS: Additional mammographic views of the right breast confirm an irregular mass in the upper inner quadrant of the right breast middle to posterior thirds, measuring up to 1.8 cm. Centered in the lower outer quadrant, and extending into the upper-outer quadrant are numerous pleomorphic microcalcifications. Several magnification images were obtained for evaluation of these calcifications. There is some respiratory motion on some of the images. The calcifications span approximately 7.6 cm anterior posterior diameter by 6 cm superior to inferior and approximately 3.5 cm transverse diameter. Additionally, there is a circumscribed oval mass in the upper-outer quadrant of the right breast measuring approximately 1.2 cm. In the retroareolar outer left breast is a circumscribed oval mass measuring approximately 1.2 cm. Mammographic images were processed with CAD. On physical exam, there is minimal  focal nodular thickening in the 2 o'clock region the right breast approximately 7 cm from the nipple. In the left breast, there is a smooth palpable lump in the retroareolar region at 3 o'clock position. Targeted ultrasound is performed, showing in the right breast at 2 o'clock position 7 cm from the nipple is a hypoechoic irregular mass with marked posterior acoustic shadowing that spans 1.5 x 1.2 x 0.9 cm. This corresponds to the irregular mass seen medially on the mammogram. In the right breast at 10:30 position 5 cm from the nipple of is a circumscribed oval homogeneously hypoechoic solid mass measuring 1.5 x 0.8 x 1.2 cm. This is likely a benign fibroadenoma. There been no priors for comparison. Ultrasound of the retroareolar LEFT breast shows a simple  cyst at 3 o'clock position 1 cm from the nipple measuring 1.4 x 1.3 x 0.7 cm. Adjacent to this is a second oval circumscribed hypoechoic mass measuring 0.9 x 1.0 x 0.7 cm. There is an adjacent blood vessel, but no internal blood flow. There is posterior acoustic enhancement. This is most likely a complicated cyst. Ultrasound of the left axilla shows a complex fluid dermal collection underlying a small bump on the skin. This fluid collection measures 1.2 x 0.7 x 1.0 cm and is most likely a sebaceous cyst. No lymphadenopathy is detected in either the left or right axilla. IMPRESSION: 1. Findings suspicious for multicentric carcinoma in the right breast. There is a suspicious irregular mass in the upper inner quadrant at 2 o'clock position 7 cm from the nipple and there are extensive pleomorphic calcifications in the outer right breast, involving both the lower outer quadrant and upper outer quadrant. 2. Probable fibroadenoma 10:30 position upper-outer quadrant right breast. 3. Probable complicated cyst 3 o'clock retroareolar left breast. 4. Probable sebaceous cyst left axilla. RECOMMENDATION: 1. Recommend ultrasound-guided biopsy of the suspicious mass in the  upper-inner quadrant of the right breast. 2. Recommend stereotactic biopsy of suspicious microcalcifications in the lower outer quadrant of the right breast. 3. Recommend cyst aspiration of the complicated cyst in the 3 o'clock retroareolar left breast. Please note there is an adjacent simple cyst in the same region of the left breast, for which no intervention is suggested. 4. Probable fibroadenoma in the upper-outer quadrant of the right breast for which no intervention is suggested at this time, given the suspicious multicentric findings. I have discussed the findings and recommendations with the patient and her daughter. Results were also provided in writing at the conclusion of the visit. If applicable, a reminder letter will be sent to the patient regarding the next appointment. BI-RADS CATEGORY  5: Highly suggestive of malignancy. Electronically Signed   By: Curlene Dolphin M.D.   On: 07/10/2016 11:41   Mm Clip Placement Right  Result Date: 07/15/2016 CLINICAL DATA:  Status post stereotactic guided biopsy of indeterminate right breast calcifications in the lower, outer quadrant and ultrasound-guided biopsy of a right breast mass at 2 o'clock EXAM: DIAGNOSTIC RIGHT MAMMOGRAM POST STEREOTACTIC AND ULTRASOUND BIOPSIES COMPARISON:  Previous exam(s). FINDINGS: Mammographic images were obtained following stereotactic guided biopsy of indeterminate right breast calcifications in the lower, outer quadrant and ultrasound-guided biopsy of a right breast mass at 2 o'clock. Post biopsy mammogram demonstrates the cylinder-shaped biopsy marker to be in the expected location of the calcifications within the lower, slightly outer right breast and the ribbon shaped biopsy marker to be within the mass at 2 o'clock. IMPRESSION: Appropriate marker positions as above. Final Assessment: Post Procedure Mammograms for Marker Placement Electronically Signed   By: Pamelia Hoit M.D.   On: 07/15/2016 09:32   Mm Rt Breast Bx W Loc Dev  1st Lesion Image Bx Spec Stereo Guide  Addendum Date: 07/22/2016   ADDENDUM REPORT: 07/21/2016 16:10 ADDENDUM: Pathology of the stereotactic guided biopsy right breast revealed BREAST, RIGHT, LOWER OUTER QUADRANT; STEREOTACTIC-GUIDED CORE BIOPSY: MICROINVASIVE LOBULAR CARCINOMA, 0.95 MM. DUCTAL CARCINOMA IN SITU, INTERMEDIATE NUCLEAR GRADE WITH FOCAL NECROSIS, ASSOCIATED WITH CALCIFICATIONS. Comment: A single 0.95 mm focus of invasive lobular carcinoma, classic type, is identified and confirmed by cytokeratin immunohistochemistry with appropriately stained control. The tumor is arising in association with DCIS which is present in several core fragments and probably represents an extensive intraductal component. Classic invasive lobular carcinoma is essentially always  ER positive and HER2 negative, and the small size is associated with very low risk. Given the concurrent larger tumor, biomarkers are performed only on part B at this time. If a larger invasive lobular component is discovered at a later time, biomarkers can be done as clinically indicated. This was found to be concordant with Dr. Raul Del impression and notes. Recommendation:  Surgical and oncology referrals. At the patient's request, results were relayed to the patient by phone by Dr. Jimmye Norman on 07/21/16. The patient stated she has done well following the biopsy. All of her questions were answered. She was told she will be contacted by the nurse navigators who will arrange her referrals. Results and recommendations were relayed to Tanya Nones, RN, nurse navigator for [facility] by [place], [place]. Appointments were made with Dr. Tamala Julian, surgeon for 07/23/16 at 8:45 AM and with Dr. Janese Banks, medical oncology for 07/31/16 at 1:30 PM. The patient has been notified of the appointments. She was encouraged to call the [facility] or the nurse navigators with any further questions or concerns. Addendum by Jetta Lout, RRA on  07/21/16. Electronically Signed   By: Dorise Bullion III M.D   On: 07/21/2016 16:10   Result Date: 07/22/2016 CLINICAL DATA:  71 year old female with indeterminate right breast calcifications EXAM: RIGHT BREAST STEREOTACTIC CORE NEEDLE BIOPSY COMPARISON:  Previous exams. FINDINGS: The patient and I discussed the procedure of stereotactic-guided biopsy including benefits and alternatives. We discussed the high likelihood of a successful procedure. We discussed the risks of the procedure including infection, bleeding, tissue injury, clip migration, and inadequate sampling. Informed written consent was given. The usual time out protocol was performed immediately prior to the procedure. Using sterile technique and 1% Lidocaine as local anesthetic, under stereotactic guidance, a 9 gauge vacuum assist device was used to perform core needle biopsy of calcifications in the lower outer quadrant of the right breast using a lateral to medial approach. Specimen radiograph was performed showing calcifications within the specimen. Specimens with calcifications are identified for pathology. At the conclusion of the procedure, a cylinder-shaped tissue marker clip was deployed into the biopsy cavity. Follow-up 2-view mammogram was performed and dictated separately. IMPRESSION: Stereotactic-guided biopsy of indeterminate right breast calcifications. No apparent complications. Electronically Signed: By: Pamelia Hoit M.D. On: 07/15/2016 09:33   [place] Rt Breast Bx W Loc Dev 1st Lesion Img Bx Spec US Guide  Addendum Date: 07/22/2016   ADDENDUM REPORT: 07/21/2016 16:16 ADDENDUM: Pathology of the ultrasound guided biopsy of the right breast, 2:00 o'clock, 7 cm from nipple revealed BREAST, RIGHT, 2 O'CLOCK, 7 CM FROM NIPPLE; ULTRASOUND-GUIDED CORE BIOPSY: INVASIVE MAMMARY CARCINOMA, NO SPECIAL TYPE.Size of invasive carcinoma: 13 mm. Histologic grade of invasive carcinoma: Grade 1. Ductal carcinoma in situ: Not identified. Lymphovascular  invasion: Not identified. Comment: The definitive grade will be assigned on the excisional specimen. The findings were communicated to Monica Villanueva in the [facility] on 07/16/16 at 11:25 AM. Read-back procedure was performed. This was found to be concordant with Dr. Raul Del impression and notes. Recommendations:  Surgical and oncology referrals. At the patient's request, results and recommendations were relayed to the patient by phone by Dr. Jimmye Norman on 07/21/16. The patient stated she has done well following the biopsy. All of her questions were answered. She was informed that she will be contacted by the nurse navigators at [facility] with referral information. Results and recommendations were relayed to Tanya Nones, RN, nurse navigator for [place]  [facility] by Jetta Lout, [place] on 07/21/16. Appointments were made with Dr. Tamala Julian, surgeon for 07/23/16 at 8:45 AM and with Dr. Janese Banks, medical oncologist for 07/31/16 at 1:30 PM. The patient has been notified of the appointment information. Addendum by Jetta Lout, RRA on 07/21/16. Electronically Signed   By: Dorise Bullion III M.D   On: 07/21/2016 16:16   Result Date: 07/22/2016 CLINICAL DATA:  71 year old female with suspicious right breast mass at 2 o'clock, 7 cm from the nipple EXAM: ULTRASOUND GUIDED RIGHT BREAST CORE NEEDLE BIOPSY COMPARISON:  Previous exam(s). FINDINGS: I met with the patient and we discussed the procedure of ultrasound-guided biopsy, including benefits and alternatives. We discussed the high likelihood of a successful procedure. We discussed the risks of the procedure, including infection, bleeding, tissue injury, clip migration, and inadequate sampling. Informed written consent was given. The usual time-out protocol was performed immediately prior to the procedure. Using sterile technique and 1% Lidocaine as local anesthetic, under direct ultrasound visualization, a 12 gauge spring-loaded device  was used to perform biopsy of an indeterminate right breast mass at 2 o'clock, 7 cm from the nipple using a medial to lateral approach. At the conclusion of the procedure a ribbon shaped tissue marker clip was deployed into the biopsy cavity. Follow up 2 view mammogram was performed and dictated separately. IMPRESSION: Ultrasound guided biopsy of an indeterminate right breast mass at 2 o'clock. No apparent complications. Electronically Signed: By: Pamelia Hoit M.D. On: 07/15/2016 09:33   US Breast Aspiration Left  Result Date: 07/15/2016 CLINICAL DATA:  71 year old female for ultrasound-guided aspiration of a probable complicated cyst within the left breast at 3 o'clock, 2 cm from the nipple EXAM: ULTRASOUND GUIDED LEFT BREAST CYST ASPIRATION COMPARISON:  Previous exams. PROCEDURE: Using sterile technique, 1% lidocaine, under direct ultrasound visualization, needle aspiration of a left breast cyst at 3 o'clock, 2 cm was performed yielding nonbloody cyst fluid. The cyst completely collapsed following aspiration. IMPRESSION: Ultrasound-guided aspiration of a left breast cyst at 3 o'clock, 2 cm from the nipple. No apparent complications. RECOMMENDATIONS: Further recommendations will be made pending results of the patient's right breast biopsies, also performed today. Electronically Signed   By: Pamelia Hoit M.D.   On: 07/15/2016 09:35    Assessment and plan- Patient is a 71 y.o. female with newly diagnosed multifocal right breast cancer two foci of clinical Stage 1AT1c cN0 cM0  1. I explained to the patient the results of her mammogram ultrasound as well as core biopsy. Patient was noted to have at least 2 foci of right breast cancer- 1 of which was an invasive lobular carcinoma and the other one was invasive mammary carcinoma among grade 1 ER greater than 90% positive PR greater than 90% positive and HER-2/neu -1.3 cm and core biopsy. Left breast cyst was aspirated and was negative for malignancy. Patient is  scheduled to undergo a right mastectomy and sentinel lymph node biopsy with possible axillary lymph node dissection.  At this time although patient has 2 foci of right breast cancer and associated calcifications these are strongly hormone positive and grade 1.  I would not recommend neoadjuvant chemotherapy. Grade 1 strongly ER PR positive tumor is unlikely to respond well to neoadjuvant chemotherapy.   I will see the patient post mastectomy and review pathology with her. Based on the findings on final pathology, I will decide if patient would benefit from adjuvant chemotherapy anyways or would need mammaprint testing to decide about adjuvant chemotherapy. I discussed  with the patient what mammaprint test is all about and how the results are interpreted. If she falls in the high risk zone, she would benefit from adjuvant chemotherapy. If it comes back as low risk, she will not need adjuvant chemo. I will discuss all this in greater detail after final path is back.  Patient has a strongly ER PR positive tumor and would benefit from adjuvant hormone therapy which I will discuss during next visit  Patient may not need post mastectomy radiation and this will also depend on final pathology   Thank you for this kind referral and the opportunity to participate in the care of this patient   Visit Diagnosis 1. Malignant neoplasm of right breast in female, estrogen receptor positive, unspecified site of breast [facility])     Dr. Randa Evens, MD, MPH [facility] at [facility] Pager- [phone] 07/31/2016 2:50 PM

## 2016-08-03 ENCOUNTER — Encounter: Payer: Self-pay | Admitting: Oncology

## 2016-08-06 ENCOUNTER — Other Ambulatory Visit: Payer: Self-pay | Admitting: Surgery

## 2016-08-06 DIAGNOSIS — C50811 Malignant neoplasm of overlapping sites of right female breast: Secondary | ICD-10-CM

## 2016-08-07 ENCOUNTER — Encounter
Admission: RE | Admit: 2016-08-07 | Discharge: 2016-08-07 | Disposition: A | Discharge status: Home / Self Care | Payer: Medicare HMO | Source: Ambulatory Visit | Attending: Surgery | Admitting: Surgery

## 2016-08-07 ENCOUNTER — Encounter: Payer: Self-pay | Admitting: Diagnostic Radiology

## 2016-08-07 DIAGNOSIS — Z01810 Encounter for preprocedural cardiovascular examination: Secondary | ICD-10-CM | POA: Insufficient documentation

## 2016-08-07 DIAGNOSIS — R9431 Abnormal electrocardiogram [ECG] [EKG]: Secondary | ICD-10-CM | POA: Insufficient documentation

## 2016-08-07 HISTORY — DX: Gastro-esophageal reflux disease without esophagitis: K21.9

## 2016-08-07 HISTORY — DX: Malignant (primary) neoplasm, unspecified: C80.1

## 2016-08-07 NOTE — Patient Instructions (Addendum)
  Your procedure is scheduled on: 08-11-16 (Tuesday) Report to [place] (2ND DESK ON RIGHT)  @ 10:15 AM  Remember: Instructions that are not followed completely may result in serious medical risk, up to and including death, or upon the discretion of your surgeon and anesthesiologist your surgery may need to be rescheduled.    _x___ 1. Do not eat food or drink liquids after midnight. No gum chewing or hard candies.     __x__ 2. No Alcohol for 24 hours before or after surgery.   __x__3. No Smoking for 24 prior to surgery.   ____  4. Bring all medications with you on the day of surgery if instructed.    __x__ 5. Notify your doctor if there is any change in your medical condition     (cold, fever, infections).     Do not wear jewelry, make-up, hairpins, clips or nail polish.  Do not wear lotions, powders, or perfumes. You may wear deodorant.  Do not shave 48 hours prior to surgery. Men may shave face and neck.  Do not bring valuables to the hospital.    [facility] is not responsible for any belongings or valuables.               Contacts, dentures or bridgework may not be worn into surgery.  Leave your suitcase in the car. After surgery it may be brought to your room.  For patients admitted to the hospital, discharge time is determined by your treatment team.   Patients discharged the day of surgery will not be allowed to drive home.  You will need someone to drive you home and stay with you the night of your procedure.    Please read over the following fact sheets that you were given:     ____ Take anti-hypertensive (unless it includes a diuretic), cardiac, seizure, asthma,     anti-reflux and psychiatric medicines. These include:  1. NONE  2.  3.  4.  5.  6.  ____Fleets enema or Magnesium Citrate as directed.   _x___ Use CHG Soap or sage wipes as directed on instruction sheet   ____ Use inhalers on the day of surgery and bring to hospital day of  surgery  ____ Stop Metformin and Janumet 2 days prior to surgery.    ____ Take 1/2 of usual insulin dose the night before surgery and none on the morningsurgery.   ____ Follow recommendations from Cardiologist, Pulmonologist or PCP regarding stopping Aspirin, Coumadin, Pllavix ,Eliquis, Effient, or Pradaxa, and Pletal.  X____Stop Anti-inflammatories such as Advil, Aleve, Ibuprofen, Motrin, Naproxen, Naprosyn, Goodies powders or aspirin products NOW-OK to take Tylenol    ____ Stop supplements until after surgery.    ____ Bring C-Pap to the hospital.

## 2016-08-10 ENCOUNTER — Encounter
Admission: RE | Admit: 2016-08-10 | Discharge: 2016-08-10 | Disposition: A | Discharge status: Home / Self Care | Payer: Medicare HMO | Source: Ambulatory Visit | Attending: Surgery | Admitting: Surgery

## 2016-08-10 DIAGNOSIS — Z01810 Encounter for preprocedural cardiovascular examination: Secondary | ICD-10-CM | POA: Diagnosis not present

## 2016-08-10 DIAGNOSIS — R9431 Abnormal electrocardiogram [ECG] [EKG]: Secondary | ICD-10-CM | POA: Diagnosis not present

## 2016-08-11 ENCOUNTER — Encounter
Admission: RE | Disposition: A | Discharge status: Home / Self Care | Payer: Self-pay | Source: Ambulatory Visit | Attending: Surgery

## 2016-08-11 ENCOUNTER — Encounter: Payer: Self-pay | Admitting: *Deleted

## 2016-08-11 ENCOUNTER — Inpatient Hospital Stay: Payer: Medicare HMO | Admitting: Anesthesiology

## 2016-08-11 ENCOUNTER — Ambulatory Visit
Admission: RE | Admit: 2016-08-11 | Discharge: 2016-08-11 | Disposition: A | Discharge status: Home / Self Care | Payer: Medicare HMO | Source: Ambulatory Visit | Attending: Surgery | Admitting: Surgery

## 2016-08-11 ENCOUNTER — Observation Stay
Admission: RE | Admit: 2016-08-11 | Discharge: 2016-08-12 | Disposition: A | Discharge status: Home / Self Care | Payer: Medicare HMO | Source: Ambulatory Visit | Attending: Surgery | Admitting: Surgery

## 2016-08-11 DIAGNOSIS — C773 Secondary and unspecified malignant neoplasm of axilla and upper limb lymph nodes: Secondary | ICD-10-CM | POA: Insufficient documentation

## 2016-08-11 DIAGNOSIS — C50211 Malignant neoplasm of upper-inner quadrant of right female breast: Secondary | ICD-10-CM | POA: Diagnosis not present

## 2016-08-11 DIAGNOSIS — Z853 Personal history of malignant neoplasm of breast: Secondary | ICD-10-CM | POA: Diagnosis present

## 2016-08-11 DIAGNOSIS — C50811 Malignant neoplasm of overlapping sites of right female breast: Secondary | ICD-10-CM | POA: Diagnosis not present

## 2016-08-11 DIAGNOSIS — Z170 Estrogen receptor positive status [ER+]: Secondary | ICD-10-CM | POA: Insufficient documentation

## 2016-08-11 DIAGNOSIS — C50919 Malignant neoplasm of unspecified site of unspecified female breast: Secondary | ICD-10-CM | POA: Diagnosis present

## 2016-08-11 DIAGNOSIS — K219 Gastro-esophageal reflux disease without esophagitis: Secondary | ICD-10-CM | POA: Insufficient documentation

## 2016-08-11 DIAGNOSIS — N6310 Unspecified lump in the right breast, unspecified quadrant: Secondary | ICD-10-CM | POA: Diagnosis present

## 2016-08-11 DIAGNOSIS — L910 Hypertrophic scar: Secondary | ICD-10-CM | POA: Diagnosis not present

## 2016-08-11 DIAGNOSIS — C50911 Malignant neoplasm of unspecified site of right female breast: Secondary | ICD-10-CM | POA: Diagnosis not present

## 2016-08-11 HISTORY — PX: SENTINEL NODE BIOPSY: SHX6608

## 2016-08-11 HISTORY — PX: SIMPLE MASTECTOMY WITH AXILLARY SENTINEL NODE BIOPSY: SHX6098

## 2016-08-11 SURGERY — SIMPLE MASTECTOMY
Anesthesia: General | Laterality: Right

## 2016-08-11 MED ORDER — DEXAMETHASONE SODIUM PHOSPHATE 10 MG/ML IJ SOLN
INTRAMUSCULAR | Status: AC
  Filled 2016-08-11: qty 1

## 2016-08-11 MED ORDER — LACTATED RINGERS IV SOLN
INTRAVENOUS | Status: DC
  Administered 2016-08-11 (×2): via INTRAVENOUS

## 2016-08-11 MED ORDER — FENTANYL CITRATE (PF) 100 MCG/2ML IJ SOLN
INTRAMUSCULAR | Status: AC
  Filled 2016-08-11: qty 2

## 2016-08-11 MED ORDER — PNEUMOCOCCAL VAC POLYVALENT 25 MCG/0.5ML IJ INJ: 0.5000 mL | INJECTION | INTRAMUSCULAR | Status: DC

## 2016-08-11 MED ORDER — PHENYLEPHRINE HCL 10 MG/ML IJ SOLN
INTRAMUSCULAR | Status: DC | PRN
  Administered 2016-08-11: 100 ug via INTRAVENOUS

## 2016-08-11 MED ORDER — SEVOFLURANE IN SOLN
RESPIRATORY_TRACT | Status: AC
  Filled 2016-08-11: qty 250

## 2016-08-11 MED ORDER — FENTANYL CITRATE (PF) 100 MCG/2ML IJ SOLN
25.0000 ug | INTRAMUSCULAR | Status: AC | PRN
  Administered 2016-08-11 (×6): 25 ug via INTRAVENOUS

## 2016-08-11 MED ORDER — MORPHINE SULFATE (PF) 2 MG/ML IV SOLN: 1.0000 mg | INTRAVENOUS | Status: DC | PRN

## 2016-08-11 MED ORDER — PROPOFOL 10 MG/ML IV BOLUS
INTRAVENOUS | Status: AC
  Filled 2016-08-11: qty 20

## 2016-08-11 MED ORDER — CALCIUM CARBONATE ANTACID 500 MG PO CHEW: 1.0000 | CHEWABLE_TABLET | ORAL | Status: DC | PRN

## 2016-08-11 MED ORDER — ACETAMINOPHEN 650 MG RE SUPP
650.0000 mg | Freq: Four times a day (QID) | RECTAL | Status: DC | PRN
Start: 2016-08-11 — End: 2016-08-12

## 2016-08-11 MED ORDER — LIDOCAINE HCL 2 % EX GEL
CUTANEOUS | Status: AC
  Filled 2016-08-11: qty 5

## 2016-08-11 MED ORDER — HYDROCODONE-ACETAMINOPHEN 5-325 MG PO TABS: 1.0000 | ORAL_TABLET | ORAL | Status: DC | PRN

## 2016-08-11 MED ORDER — CALCIUM-MAGNESIUM-VITAMIN D ER 600-40-500 MG-MG-UNIT PO TB24: ORAL_TABLET | Freq: Every day | ORAL | Status: DC

## 2016-08-11 MED ORDER — ONDANSETRON HCL 4 MG/2ML IJ SOLN
INTRAMUSCULAR | Status: DC | PRN
  Administered 2016-08-11: 4 mg via INTRAVENOUS

## 2016-08-11 MED ORDER — MIDAZOLAM HCL 2 MG/2ML IJ SOLN
INTRAMUSCULAR | Status: AC
  Filled 2016-08-11: qty 2

## 2016-08-11 MED ORDER — MIDAZOLAM HCL 2 MG/2ML IJ SOLN
INTRAMUSCULAR | Status: DC | PRN
Start: 2016-08-11 — End: 2016-08-11
  Administered 2016-08-11: 1 mg via INTRAVENOUS

## 2016-08-11 MED ORDER — ONDANSETRON HCL 4 MG/2ML IJ SOLN: 4.0000 mg | Freq: Four times a day (QID) | INTRAMUSCULAR | Status: DC | PRN

## 2016-08-11 MED ORDER — LIDOCAINE HCL (CARDIAC) 20 MG/ML IV SOLN
INTRAVENOUS | Status: DC | PRN
  Administered 2016-08-11: 80 mg via INTRAVENOUS

## 2016-08-11 MED ORDER — ONDANSETRON 4 MG PO TBDP: 4.0000 mg | ORAL_TABLET | Freq: Four times a day (QID) | ORAL | Status: DC | PRN

## 2016-08-11 MED ORDER — FAMOTIDINE 20 MG PO TABS
20.0000 mg | ORAL_TABLET | Freq: Once | ORAL | Status: AC
  Administered 2016-08-11: 20 mg via ORAL

## 2016-08-11 MED ORDER — DEXTROSE-NACL 5-0.2 % IV SOLN
INTRAVENOUS | Status: DC
  Administered 2016-08-11: 15:00:00 via INTRAVENOUS

## 2016-08-11 MED ORDER — FAMOTIDINE 20 MG PO TABS
ORAL_TABLET | ORAL | Status: AC
  Administered 2016-08-11: 20 mg via ORAL
  Filled 2016-08-11: qty 1

## 2016-08-11 MED ORDER — FENTANYL CITRATE (PF) 100 MCG/2ML IJ SOLN
25.0000 ug | INTRAMUSCULAR | Status: AC
  Administered 2016-08-11 (×2): 25 ug via INTRAVENOUS

## 2016-08-11 MED ORDER — MAGNESIUM OXIDE 400 (241.3 MG) MG PO TABS: 200.0000 mg | ORAL_TABLET | Freq: Every day | ORAL | Status: DC

## 2016-08-11 MED ORDER — PROPOFOL 10 MG/ML IV BOLUS
INTRAVENOUS | Status: DC | PRN
  Administered 2016-08-11: 180 mg via INTRAVENOUS

## 2016-08-11 MED ORDER — LIDOCAINE HCL (PF) 2 % IJ SOLN
INTRAMUSCULAR | Status: AC
  Filled 2016-08-11: qty 2

## 2016-08-11 MED ORDER — ACETAMINOPHEN 325 MG PO TABS: 650.0000 mg | ORAL_TABLET | Freq: Four times a day (QID) | ORAL | Status: DC | PRN

## 2016-08-11 MED ORDER — ONDANSETRON HCL 4 MG/2ML IJ SOLN
INTRAMUSCULAR | Status: AC
  Filled 2016-08-11: qty 2

## 2016-08-11 MED ORDER — FENTANYL CITRATE (PF) 100 MCG/2ML IJ SOLN
INTRAMUSCULAR | Status: AC
  Administered 2016-08-11: 25 ug via INTRAVENOUS
  Filled 2016-08-11: qty 2

## 2016-08-11 MED ORDER — TECHNETIUM TC 99M SULFUR COLLOID FILTERED
0.8660 | Freq: Once | INTRAVENOUS | Status: AC | PRN
  Administered 2016-08-11: 0.866 via INTRADERMAL

## 2016-08-11 MED ORDER — FENTANYL CITRATE (PF) 100 MCG/2ML IJ SOLN
INTRAMUSCULAR | Status: DC | PRN
  Administered 2016-08-11 (×2): 50 ug via INTRAVENOUS
  Administered 2016-08-11 (×2): 25 ug via INTRAVENOUS
  Administered 2016-08-11: 50 ug via INTRAVENOUS
  Administered 2016-08-11: 25 ug via INTRAVENOUS
  Administered 2016-08-11: 50 ug via INTRAVENOUS

## 2016-08-11 MED ORDER — CALCIUM CITRATE-VITAMIN D 500-400 MG-UNIT PO CHEW
1.0000 | CHEWABLE_TABLET | Freq: Every day | ORAL | Status: DC
  Filled 2016-08-11: qty 1

## 2016-08-11 MED ORDER — ONDANSETRON HCL 4 MG/2ML IJ SOLN: 4.0000 mg | Freq: Once | INTRAMUSCULAR | Status: DC | PRN

## 2016-08-11 SURGICAL SUPPLY — 45 items
BLADE SURG 15 STRL LF DISP TIS (BLADE) ×2 IMPLANT
BLADE SURG 15 STRL SS (BLADE) ×4
BULB RESERV EVAC DRAIN JP 100C (MISCELLANEOUS) IMPLANT
CANISTER SUCT 1200ML W/VALVE (MISCELLANEOUS) ×3 IMPLANT
CHLORAPREP W/TINT 26ML (MISCELLANEOUS) ×3 IMPLANT
CNTNR SPEC 2.5X3XGRAD LEK (MISCELLANEOUS) ×2
CONT SPEC 4OZ STER OR WHT (MISCELLANEOUS) ×4
CONTAINER SPEC 2.5X3XGRAD LEK (MISCELLANEOUS) ×2 IMPLANT
DERMABOND ADVANCED (GAUZE/BANDAGES/DRESSINGS) ×4
DERMABOND ADVANCED .7 DNX12 (GAUZE/BANDAGES/DRESSINGS) ×2 IMPLANT
DRAIN CHANNEL JP 15F RND 16 (MISCELLANEOUS) IMPLANT
DRAIN CHANNEL JP 19F (MISCELLANEOUS) ×6 IMPLANT
DRAPE LAPAROTOMY 100X77 ABD (DRAPES) ×3 IMPLANT
DRAPE LAPAROTOMY TRNSV 106X77 (MISCELLANEOUS) ×3 IMPLANT
ELECT REM PT RETURN 9FT ADLT (ELECTROSURGICAL) ×3
ELECTRODE REM PT RTRN 9FT ADLT (ELECTROSURGICAL) ×1 IMPLANT
GAUZE SPONGE 4X4 12PLY STRL (GAUZE/BANDAGES/DRESSINGS) IMPLANT
GLOVE BIO SURGEON STRL SZ7.5 (GLOVE) ×18 IMPLANT
GOWN STRL REUS W/ TWL LRG LVL3 (GOWN DISPOSABLE) ×3 IMPLANT
GOWN STRL REUS W/TWL LRG LVL3 (GOWN DISPOSABLE) ×6
HARMONIC SCALPEL FOCUS (MISCELLANEOUS) IMPLANT
KIT RM TURNOVER STRD PROC AR (KITS) ×3 IMPLANT
LABEL OR SOLS (LABEL) ×3 IMPLANT
NDL SAFETY 18GX1.5 (NEEDLE) ×3 IMPLANT
NDL SAFETY 22GX1.5 (NEEDLE) ×3 IMPLANT
PACK BASIN MINOR ARMC (MISCELLANEOUS) ×3 IMPLANT
SLEVE PROBE SENORX GAMMA FIND (MISCELLANEOUS) ×3 IMPLANT
SPONGE LAP 18X18 5 PK (GAUZE/BANDAGES/DRESSINGS) ×3 IMPLANT
SUT CHROMIC 3 0 SH 27 (SUTURE) ×3 IMPLANT
SUT CHROMIC 4 0 RB 1X27 (SUTURE) ×3 IMPLANT
SUT ETH BLK MONO 3 0 FS 1 12/B (SUTURE) ×3 IMPLANT
SUT ETHILON 3-0 FS-10 30 BLK (SUTURE) ×3
SUT ETHILON 4-0 (SUTURE) ×2
SUT ETHILON 4-0 FS2 18XMFL BLK (SUTURE) ×1
SUT MNCRL 4-0 (SUTURE) ×4
SUT MNCRL 4-0 27XMFL (SUTURE) ×2
SUT SILK 3-0 (SUTURE) ×4
SUT SILK 3-0 SH-1 18XCR BRD (SUTURE) ×2
SUT VICRYL+ 3-0 144IN (SUTURE) IMPLANT
SUTURE EHLN 3-0 FS-10 30 BLK (SUTURE) ×1 IMPLANT
SUTURE ETHLN 4-0 FS2 18XMF BLK (SUTURE) ×1 IMPLANT
SUTURE MNCRL 4-0 27XMF (SUTURE) ×2 IMPLANT
SUTURE SILK 3-0 SH-1 18XCR BRD (SUTURE) ×2 IMPLANT
SYRINGE 10CC LL (SYRINGE) ×3 IMPLANT
WATER STERILE IRR 1000ML POUR (IV SOLUTION) ×3 IMPLANT

## 2016-08-11 NOTE — H&P (Signed)
  She reports no change in overall condition since the day of the office exam.  She has had injection of radioactive technetium sulfur colloid.  I discussed the plan for right mastectomy.  The right side was marked YES

## 2016-08-11 NOTE — Anesthesia Preprocedure Evaluation (Addendum)
Anesthesia Evaluation  Patient identified by MRN, date of birth, ID band Patient awake    Reviewed: Allergy & Precautions, NPO status , Patient's Chart, lab work & pertinent test results  History of Anesthesia Complications Negative for: history of anesthetic complications  Airway Mallampati: III       Dental  (+) Caps   Pulmonary neg pulmonary ROS,           Cardiovascular negative cardio ROS       Neuro/Psych negative neurological ROS     GI/Hepatic Neg liver ROS, GERD  Medicated and Controlled,  Endo/Other  negative endocrine ROS  Renal/GU negative Renal ROS     Musculoskeletal   Abdominal   Peds  Hematology   Anesthesia Other Findings   Reproductive/Obstetrics                            Anesthesia Physical Anesthesia Plan  ASA: III  Anesthesia Plan: General   Post-op Pain Management:    Induction: Intravenous  Airway Management Planned: LMA  Additional Equipment:   Intra-op Plan:   Post-operative Plan:   Informed Consent: I have reviewed the patients History and Physical, chart, labs and discussed the procedure including the risks, benefits and alternatives for the proposed anesthesia with the patient or authorized representative who has indicated his/her understanding and acceptance.     Plan Discussed with:   Anesthesia Plan Comments:        Anesthesia Quick Evaluation

## 2016-08-11 NOTE — Anesthesia Procedure Notes (Signed)
Procedure Name: LMA Insertion Date/Time: 08/11/2016 11:29 AM Performed by: Allean Found Pre-anesthesia Checklist: Patient identified, Emergency Drugs available, Suction available, Patient being monitored and Timeout performed Patient Re-evaluated:Patient Re-evaluated prior to inductionOxygen Delivery Method: Circle system utilized Preoxygenation: Pre-oxygenation with 100% oxygen Intubation Type: IV induction Ventilation: Mask ventilation without difficulty LMA Size: 4.0 Number of attempts: 1 Placement Confirmation: ETT inserted through vocal cords under direct vision,  positive ETCO2 and breath sounds checked- equal and bilateral

## 2016-08-11 NOTE — Transfer of Care (Signed)
Immediate Anesthesia Transfer of Care Note  Patient: Monica Villanueva  Procedure(s) Performed: Procedure(s): SIMPLE MASTECTOMY (Right) SENTINEL NODE BIOPSY (Right) AXILLARY LYMPH NODE DISSECTION (Right)  Patient Location: PACU  Anesthesia Type:General  Level of Consciousness: awake  Airway & Oxygen Therapy: Patient Spontanous Breathing and Patient connected to face mask oxygen  Post-op Assessment: Report given to RN and Post -op Vital signs reviewed and stable  Post vital signs: Reviewed and stable  Last Vitals:  Vitals:   08/11/16 1056  BP: (!) 178/95  Pulse: 70  Resp: 18  Temp: 37.1 C    Last Pain:  Vitals:   08/11/16 1056  TempSrc: Oral  PainSc: 0-No pain         Complications: No apparent anesthesia complications

## 2016-08-11 NOTE — Op Note (Signed)
OPERATIVE REPORT  PREOPERATIVE  DIAGNOSIS: . Right breast cancer  POSTOPERATIVE DIAGNOSIS: . Right breast cancer  PROCEDURE: . Right mastectomy with sentinel lymph node biopsy  ANESTHESIA:  General  SURGEON: Rochel Brome  MD   INDICATIONS: . She had recent findings of infiltrating mammary carcinoma of the upper inner quadrant of the right breast and minimally invasive lobular carcinoma and ductal carcinoma in situ of the lower outer quadrant. Surgery was recommended for definitive treatment.  She had preoperative injection of radioactive technetium sulfur colloid  With the patient on the operating table in the supine position under general anesthesia of the right arm was placed on a lateral arm support. The breast and chest wall and upper arm were prepared with ChloraPrep and draped in a sterile manner. An oblique incision was made from inferior medial to superior lateral above and below the breast. Traction was applied to the skin with multiple 4-0 silk sutures. Skin and subcutaneous flaps were raised in the direction of the clavicle, medially towards the sternum, inferiorly to the inframammary fold, and laterally in the direction of the latissimus dorsi muscle. Electrocautery was used for hemostasis. Dissection was carried out in the axilla and also probed with a gamma counter demonstrating the location of radioactivity. The sentinel lymph node was found in the inferior aspect of the axilla and was approximately 8 mm in dimension and was dissected free from surrounding structures. The ex vivo count was in the range of 250-350 counts per second. The background count was minimal. There was no remaining palpable mass within the axilla. The sentinel lymph node was submitted for frozen section. The pathologist's later called with the report indicating no cancer was seen in the sentinel lymph node.  The breast was dissected away from the underlying deep fascia using electrocautery for hemostasis.  Dissection was carried out in the axilla up to the site of the sentinel lymph node. Multiple vessels were clamped with hemostats. The lateral end of the skin ellipse was tagged with a silk stitch. There was also a focal area of old scar and keloid. The breast was submitted for routine pathology. Multiple clamped vessels were suture ligated with 3-0 chromic. Numerous other small bleeding points were cauterized. The wound was irrigated with warm saline solution. Hemostasis was subsequently intact. Two  47 French Blake drains were inserted through separate inferior stab wounds. One was placed along the anterior chest wall and the other extended into the axilla. Each drain was sutured to the skin with 3-0 nylon. The mastectomy incision was then closed with running 4-0 Monocryl subcuticular suture and Dermabond. The drains were activated seeing some scant serosanguineous drainage. The drain site was dressed with folded cotton gauze benzoin and 2 inch silk tape.  The patient appeared to tolerate the procedure satisfactorily and was then prepared for transfer to the recovery room  [facility].D.

## 2016-08-11 NOTE — Anesthesia Postprocedure Evaluation (Signed)
Anesthesia Post Note  Patient: Monica Villanueva  Procedure(s) Performed: Procedure(s) (LRB): SIMPLE MASTECTOMY (Right) SENTINEL NODE BIOPSY (Right)  Patient location during evaluation: PACU Anesthesia Type: General Level of consciousness: awake and alert Pain management: pain level controlled Vital Signs Assessment: post-procedure vital signs reviewed and stable Respiratory status: spontaneous breathing and respiratory function stable Cardiovascular status: stable Anesthetic complications: no     Last Vitals:  Vitals:   08/11/16 1056 08/11/16 1425  BP: (!) 178/95 (!) 158/89  Pulse: 70   Resp: 18   Temp: 37.1 C 36.9 C    Last Pain:  Vitals:   08/11/16 1425  TempSrc:   PainSc: 0-No pain                 KEPHART,WILLIAM K

## 2016-08-11 NOTE — Anesthesia Post-op Follow-up Note (Cosign Needed)
Anesthesia QCDR form completed.        

## 2016-08-12 ENCOUNTER — Encounter: Payer: Self-pay | Admitting: Surgery

## 2016-08-12 LAB — SURGICAL PATHOLOGY

## 2016-08-12 NOTE — Care Management Obs Status (Signed)
[facility] NOTIFICATION   Patient Details  Name: Janise Gora MRN: [id number] Date of Birth: [birth year]   Medicare Observation Status Notification Given:  No (admitted observation less than 24 hours)    Beverly Sessions, RN 08/12/2016, 10:39 AM

## 2016-08-12 NOTE — Progress Notes (Signed)
Pt d/c to home today.  x2 JP to remain intact.  IV removed intact.  Education provided to pt and family on JP drain care, how to record/empty (JP [employer] sheet provided) and how to monitor for s/s of infection.  Rx's given to pt w/all questions and concerns addressed.  D/C paperwork reviewed and education provided with all questions and concerns addressed.  Pt family at bedside for home transport.

## 2016-08-15 ENCOUNTER — Encounter: Payer: Self-pay | Admitting: Surgery

## 2016-08-15 NOTE — Addendum Note (Signed)
Addendum  created 08/15/16 2774 by Andria Frames, MD   Anesthesia Event edited

## 2016-08-18 ENCOUNTER — Encounter: Payer: Self-pay | Admitting: Oncology

## 2016-08-18 ENCOUNTER — Encounter: Payer: Self-pay | Admitting: *Deleted

## 2016-08-18 ENCOUNTER — Inpatient Hospital Stay: Payer: Medicare HMO | Attending: Oncology | Admitting: Oncology

## 2016-08-18 ENCOUNTER — Inpatient Hospital Stay: Payer: Medicare HMO

## 2016-08-18 VITALS — BP 172/92 | HR 69 | Temp 96.4°F | Resp 18 | Wt 229.7 lb

## 2016-08-18 DIAGNOSIS — K219 Gastro-esophageal reflux disease without esophagitis: Secondary | ICD-10-CM | POA: Diagnosis not present

## 2016-08-18 DIAGNOSIS — Z170 Estrogen receptor positive status [ER+]: Secondary | ICD-10-CM | POA: Insufficient documentation

## 2016-08-18 DIAGNOSIS — C50211 Malignant neoplasm of upper-inner quadrant of right female breast: Secondary | ICD-10-CM | POA: Diagnosis not present

## 2016-08-18 DIAGNOSIS — Z79899 Other long term (current) drug therapy: Secondary | ICD-10-CM | POA: Diagnosis not present

## 2016-08-18 DIAGNOSIS — C50411 Malignant neoplasm of upper-outer quadrant of right female breast: Secondary | ICD-10-CM | POA: Insufficient documentation

## 2016-08-18 DIAGNOSIS — C50911 Malignant neoplasm of unspecified site of right female breast: Secondary | ICD-10-CM

## 2016-08-18 LAB — CBC WITH DIFFERENTIAL/PLATELET
Basophils Absolute: 0.1 10*3/uL (ref 0–0.1)
Basophils Relative: 1 %
EOS ABS: 0.4 10*3/uL (ref 0–0.7)
EOS PCT: 4 %
HCT: 32.7 % — ABNORMAL LOW (ref 35.0–47.0)
HEMOGLOBIN: 11.1 g/dL — AB (ref 12.0–16.0)
LYMPHS ABS: 2.4 10*3/uL (ref 1.0–3.6)
Lymphocytes Relative: 25 %
MCH: 32.6 pg (ref 26.0–34.0)
MCHC: 34.1 g/dL (ref 32.0–36.0)
MCV: 95.6 fL (ref 80.0–100.0)
MONOS PCT: 5 %
Monocytes Absolute: 0.5 10*3/uL (ref 0.2–0.9)
Neutro Abs: 6.3 10*3/uL (ref 1.4–6.5)
Neutrophils Relative %: 65 %
PLATELETS: 229 10*3/uL (ref 150–440)
RBC: 3.42 MIL/uL — ABNORMAL LOW (ref 3.80–5.20)
RDW: 13 % (ref 11.5–14.5)
WBC: 9.7 10*3/uL (ref 3.6–11.0)

## 2016-08-18 LAB — COMPREHENSIVE METABOLIC PANEL
ALT: 14 U/L (ref 14–54)
ANION GAP: 5 (ref 5–15)
AST: 17 U/L (ref 15–41)
Albumin: 3.5 g/dL (ref 3.5–5.0)
Alkaline Phosphatase: 61 U/L (ref 38–126)
BUN: 11 mg/dL (ref 6–20)
CO2: 30 mmol/L (ref 22–32)
Calcium: 9.2 mg/dL (ref 8.9–10.3)
Chloride: 104 mmol/L (ref 101–111)
Creatinine, Ser: 0.7 mg/dL (ref 0.44–1.00)
Glucose, Bld: 192 mg/dL — ABNORMAL HIGH (ref 65–99)
Potassium: 4.3 mmol/L (ref 3.5–5.1)
SODIUM: 139 mmol/L (ref 135–145)
Total Bilirubin: 0.6 mg/dL (ref 0.3–1.2)
Total Protein: 7.1 g/dL (ref 6.5–8.1)

## 2016-08-18 NOTE — Progress Notes (Signed)
Pt has JP drainage w 20-25 ml per bulb-dark red discharge .  Per daughter draining 2 x d. Per pt doing well .

## 2016-08-18 NOTE — Progress Notes (Signed)
Hematology/Oncology Consult note [facility]  [phone] [phone]  Patient Care Team: Pleas Koch, NP as PCP - General (Internal Medicine)   Name of the patient: Monica Villanueva  [id number]  November 27, 1945   Date of visit: 08/18/16  Diagnosis- 2 foci of invasive breast cancer in the right breast Stage I pT1c pn1(mi)sn cM0 ER/PR positive and Her2 negative  Chief complaint/ Reason for visit- discuss pathology results  Heme/Onc history: 1. Patient is a 71 year old female who underwent bilateral screening mammogram on 36 MT 18 which showed masses in bilateral breasts requiring further evaluation.  2. Diagnostic bilateral mammogram on 07/10/2016 showed:IMPRESSION:1. Findings suspicious for multicentric carcinoma in the right breast. There is a suspicious irregular mass in the upper inner quadrant at 2 o'clock position 7 cm from the nipple and there are extensive pleomorphic calcifications in the outer right breast, involving both the lower outer quadrant and upper outer quadrant. 2. Probable fibroadenoma 10:30 position upper-outer quadrant right breast. 3. Probable complicated cyst 3 o'clock retroareolar left breast.4. Probable sebaceous cyst left axilla.  3. Patient underwent core biopsy of both the right breast lesions as well as ultrasound-guided aspiration of left breast cyst. Pathology showed:  DIAGNOSIS:  A. BREAST, RIGHT, LOWER OUTER QUADRANT; STEREOTACTIC-GUIDED CORE BIOPSY:  - MICROINVASIVE LOBULAR CARCINOMA, 0.95 MM.  - DUCTAL CARCINOMA IN SITU, INTERMEDIATE NUCLEAR GRADE WITH FOCAL  NECROSIS, ASSOCIATED WITH CALCIFICATIONS.   B. BREAST, RIGHT, 2 O'CLOCK, 7 CM FROM NIPPLE; ULTRASOUND-GUIDED CORE  BIOPSY:  - INVASIVE MAMMARY CARCINOMA, NO SPECIAL TYPE.   4. At baseline patient is in good health and reports no significant comorbidities. She does not take any other medications except multivitamins. No family history of breast  cancer. Menarche at the age of 81 and menopause in 46. She did not breast-feed and used oral contraceptive pills remotely. She is G2 P2 L2.  5. Patient underwent right simple mastectomy with SLNB on 4/2/418. Pathology showed:  DIAGNOSIS:  A. SENTINEL LYMPH NODE; EXCISION:  - ONE LYMPH NODE NEGATIVE FOR MALIGNANCY (0/1).   B. BREAST; SIMPLE MASTECTOMY:  - INVASIVE MAMMARY CARCINOMA, NO SPECIAL TYPE (2:00 LOCATION).  - INVASIVE LOBULAR CARCINOMA ARISING IN AREA OF EXTENSIVE DUCTAL  CARCINOMA IN SITU WITH ADJACENT LOBULAR CARCINOMA IN SITU (6:00  LOCATION).  - MICRO METASTATIC CARCINOMA INVOLVES ONE OF THREE LYMPH NODES (1/3).  - SEE CANCER SUMMARY BELOW.  - FIBROADENOMA.  - NIPPLE WITH INTRADUCTAL PAPILLOMA.  - TWO SEPARATE BIOPSY SITES WITH METALLIC MARKERS.  - SKIN WITH KELOID.   Surgical Pathology Cancer Case Summary   INVASIVE CARCINOMA OF THE BREAST  Procedure: Simple mastectomy  Specimen Laterality: Right  Histologic Type: Invasive mammary carcinoma of no special type  invasive lobular carcinoma  Histologic Grade (Nottingham Histologic Score)       Glandular (Acinar)/Tubular Differentiation: 3       Nuclear Pleomorphism: 2       Mitotic Rate: 1       Overall Grade: 2  Tumor Size: 15 mm (invasive mammary no special type); 4 mm (invasive  lobular)  Ductal Carcinoma In Situ (DCIS): Present  Margins:    Invasive carcinoma: Negative for invasive carcinoma       Distance from closest margin: 17 mm to the deep    Ductal carcinoma in situ: Negative for invasive carcinoma       Distance from closest margin: 17 mm deep  Regional Lymph nodes:    Total # lymph nodes examined: 4    #  Sentinel lymph nodes examined: 1    # Lymph nodes with macrometastasis (>2.0 mm): 0    # Lymph nodes with isolated tumor cells (<0.2 mm): 0    # Lymph nodes with micrometastasis (> 0.2 mm and < 2.0 mm): 1    Extranodal extension: Notidentified    Treatment Effect: No known pre-surgical therapy  Lymphovascular Invasion: Not identified  Pathologic Stage Classification (pTNM, AJCC 8th Edition): pT1c pN27m  (sn)  TNM Descriptors: (m) multifocal    BREAST BIOMARKER TESTS - performed on prior biopsy of invasive mammary  carcinoma no special type (2:00 lesion)  Estrogen Receptor (ER) Status: POSITIVE, >90% nuclear staining  Progesterone Receptor (PgR) Status: POSITIVE, >90% nuclear staining  HER2 (by immunohistochemistry): Equivocal (Score 2+)  Percentage of cells with uniform intense complete membrane staining: 5%  HER2 (ERBB2) (by in situ hybridization): NEGATIVE   Interval history- she is healing well from her mastectomy and reports some soreness at that site. Denies other complaints  ECOG PS- 0 Pain scale- 0   Review of systems- Review of Systems  Constitutional: Negative for chills, fever, malaise/fatigue and weight loss.  HENT: Negative for congestion, ear discharge and nosebleeds.   Eyes: Negative for blurred vision.  Respiratory: Negative for cough, hemoptysis, sputum production, shortness of breath and wheezing.   Cardiovascular: Negative for chest pain, palpitations, orthopnea and claudication.  Gastrointestinal: Negative for abdominal pain, blood in stool, constipation, diarrhea, heartburn, melena, nausea and vomiting.  Genitourinary: Negative for dysuria, flank pain, frequency, hematuria and urgency.  Musculoskeletal: Negative for back pain, joint pain and myalgias.  Skin: Negative for rash.  Neurological: Negative for dizziness, tingling, focal weakness, seizures, weakness and headaches.  Endo/Heme/Allergies: Does not bruise/bleed easily.  Psychiatric/Behavioral: Negative for depression and suicidal ideas. The patient does not have insomnia.       No Known Allergies   Past Medical History:  Diagnosis Date  . Cancer ([facility]   . GERD (gastroesophageal reflux disease)    OCC  . Keloid of skin    right axilla      Past Surgical History:  Procedure Laterality Date  . ABDOMINAL HYSTERECTOMY  1996  . BREAST BIOPSY Right 07/15/2016   stereo bx of calcs LOQ-path pending  . BREAST BIOPSY Right 07/15/2016   uKoreabx of mass-path pending  . BREAST CYST ASPIRATION Left 07/15/2016  . COLONOSCOPY    . SENTINEL NODE BIOPSY Right 08/11/2016   Procedure: SENTINEL NODE BIOPSY;  Surgeon: JLeonie Green MD;  Location: [facility] ORS;  Service: General;  Laterality: Right;  . SIMPLE MASTECTOMY WITH AXILLARY SENTINEL NODE BIOPSY Right 08/11/2016   Procedure: SIMPLE MASTECTOMY;  Surgeon: JLeonie Green MD;  Location: [facility] ORS;  Service: General;  Laterality: Right;    Social History   Social History  . Marital status: Widowed    Spouse name: N/A  . Number of children: N/A  . Years of education: N/A   Occupational History  . Not on file.   Social History Main Topics  . Smoking status: Never Smoker  . Smokeless tobacco: Never Used  . Alcohol use Yes     Comment: OCC  . Drug use: No  . Sexual activity: Not on file   Other Topics Concern  . Not on file   Social History Narrative   Widowed.   2 children, 4 grandchildren.   Retired. Previously worked at [employer]   Enjoys writing, playing on the key boards.    Family History  Problem Relation  Age of Onset  . Hypertension Mother   . Hypertension Sister   . Breast cancer Neg Hx      Current Outpatient Prescriptions:  .  calcium carbonate (TUMS - DOSED IN MG ELEMENTAL CALCIUM) 500 MG chewable tablet, Chew 1 tablet by mouth as needed for indigestion or heartburn., Disp: , Rfl:  .  Calcium-Magnesium-Vitamin D (CALCIUM 1200+D3 PO), Take 2 tablets by mouth daily. Calcium 1200 units / Vit D 800 mg, Disp: , Rfl:   Physical exam:  Vitals:   08/18/16 0957  BP: (!) 172/92  Pulse: 69  Resp: 18  Temp: (!) 96.4 F (35.8 C)  TempSrc: Tympanic  Weight: 229 lb 11.2 oz (104.2 kg)   Physical Exam  Constitutional: She is  oriented to person, place, and time and well-developed, well-nourished, and in no distress.  HENT:  Head: Normocephalic and atraumatic.  Eyes: EOM are normal. Pupils are equal, round, and reactive to light.  Neck: Normal range of motion.  Cardiovascular: Normal rate, regular rhythm and normal heart sounds.   Pulmonary/Chest: Effort normal and breath sounds normal.  Abdominal: Soft. Bowel sounds are normal.  Neurological: She is alert and oriented to person, place, and time.  Skin: Skin is warm and dry.   Breast exam was performed in seated and lying down position. Patient is status post right mastectomy with a well-healed surgical scar. No signs of infection. Mild post surgical inflammation. 2 drains in place draining serosanguinous fluid.    CMP Latest Ref Rng & Units 05/13/2016  Glucose 70 - 99 mg/dL 109(H)  BUN 6 - 23 mg/dL 12  Creatinine 0.40 - 1.20 mg/dL 0.84  Sodium 135 - 145 mEq/L 141  Potassium 3.5 - 5.1 mEq/L 3.7  Chloride 96 - 112 mEq/L 106  CO2 19 - 32 mEq/L 30  Calcium 8.4 - 10.5 mg/dL 9.3  Total Protein 6.0 - 8.3 g/dL 8.0  Total Bilirubin 0.2 - 1.2 mg/dL 0.5  Alkaline Phos 39 - 117 U/L 58  AST 0 - 37 U/L 15  ALT 0 - 35 U/L 10   No flowsheet data found.  No images are attached to the encounter.  Nm Sentinel Node Injection  Result Date: 08/11/2016 CLINICAL DATA:  Right breast cancer. EXAM: NUCLEAR MEDICINE BREAST LYMPHOSCINTIGRAPHY . TECHNIQUE: Intradermal injection of radiopharmaceutical was performed at the 12 o'clock, 3 o'clock, 6 o'clock, and 9 o'clock positions around the right nipple. The patient was then sent to the operating room where the sentinel node(s) were identified and removed by the surgeon. RADIOPHARMACEUTICALS:  Total of 0.866 mCi Millipore-filtered Technetium-23msulfur colloid, injected in four approximately equal aliquots. IMPRESSION: Uncomplicated intradermal injection of a total of 0.866 mCi Technetium-919mulfur colloid for purposes of sentinel  node identification. Electronically Signed   By: JaMarijo ConceptionM.D.   On: 08/11/2016 11:15     Assessment and plan- Patient is a [age].o. female with newly diagnosed 2 foci of invasive carcinoma in the right breast- invasive mammary carcinoma 1.5 cm and invasive lobular carcinoma [id number] IA pT1c pN1(mi) cM0 ER PR psoitive her 2 negative  I discussed the results of pathology with the patient in detail. She had 2 foci of invasive carcinoma. The larger focus was invasive mammary carcinoma, 1.5 cm grade 2. Margins were negative. There was micromets in 1 LN which has slightly worse prognosis than node negative disease but does not predict recurrence. The second focus of carcinoma was invasive lobular carcinoma but only 4 mm. No extranodal extension or  LVI. Given that she had mastectomy I do not think she needs adjuvant radiation therapy. Given that she has a good performance status and her tumor was more than 0.5 cm, I would recommend mammaprint testing for further risk stratification to see if she would benefit from chemotherapy. If she falls in the high risk group, I would favor giving her 4 cycles of chemotherapy with taxoteere and cytoxan. If she falls in the low risk group, she would not need chemotherapy.   Her tumor was ER PR positive and she would benefit from adjuvant hormonal therapy for 5 years. I did discuss the risks and benefits of aromatase inhibitors including all but not limited to fatigue, arthralgias, worsening bone health, hypercholesterolemia. Written information about Arimidex has been given to the patient. If her mammogram comes back as low risk she will start taking her Arimidex and I will see her in about 6 weeks after starting treatment to see how she tolerates it. Baseline bone density scan showed osteopenia with a T score of -1.2 with the right femur neck. Given that her 10 year probability of a major osteoporotic fracture is less than 20,000 in that of hip fracture is less than  3%, I did not think patient needs adjuvant bisphosphonates at this time. She will take calcium 1200 mg along with vitamin D international units when she starts taking her Arimidex.  Should her mammaprint come back with a high risk score, I will see her to discuss the mammogram results and adjuvant chemotherapy options and she will start taking hormone therapy upon completion of chemotherapy  Visit Diagnosis 1. Malignant neoplasm of right breast in female, estrogen receptor positive, unspecified site of breast [facility])      Dr. Randa Evens, MD, MPH [facility] Pager- [phone] 08/18/2016 12:49 PM

## 2016-08-18 NOTE — Progress Notes (Signed)
Met with patient and her family today for her post up treatment plan with Dr. Janese Banks.  She is going to send specimen for mammoprint if not already done so by Dr. Tamala Julian.  I did confirm with his nurse Judeen Hammans, that mammprint not been completed.  Informed Dr. Elroy Channel nurse Judeen Hammans, so she could complete testing information.  Patient is to call if she has any questions or needs.

## 2016-08-22 NOTE — Discharge Summary (Signed)
DISCHARGE SUMMARY  Admission diagnosis: Multifocal carcinoma of the right breast  This 71 year old female came in through the outpatient surgery department and was carried to the operating room where she had a right mastectomy with sentinel lymph node biopsy.  Postoperatively she was kept overnight for a period of observation. She was given IV fluids. She soon tolerated a diet. She ambulated in the hallway. The nurses cared for her drains and instructed her how to empty the drains. She appeared to be in satisfactory condition the next day. Her wound was progressing satisfactorily.  Final diagnosis: Multifocal carcinoma of the right breast  Instructions were given for wound care and drain management and activities. When is to follow-up in the office.

## 2016-08-27 DIAGNOSIS — Z170 Estrogen receptor positive status [ER+]: Secondary | ICD-10-CM

## 2016-08-27 DIAGNOSIS — C50411 Malignant neoplasm of upper-outer quadrant of right female breast: Secondary | ICD-10-CM

## 2016-08-27 HISTORY — DX: Estrogen receptor positive status (ER+): Z17.0

## 2016-08-27 HISTORY — DX: Malignant neoplasm of upper-outer quadrant of right female breast: C50.411

## 2016-08-28 ENCOUNTER — Encounter: Payer: Self-pay | Admitting: Oncology

## 2016-09-01 ENCOUNTER — Telehealth: Payer: Self-pay | Admitting: *Deleted

## 2016-09-01 ENCOUNTER — Encounter: Payer: Self-pay | Admitting: Oncology

## 2016-09-01 ENCOUNTER — Other Ambulatory Visit: Payer: Self-pay | Admitting: Oncology

## 2016-09-01 MED ORDER — ANASTROZOLE 1 MG PO TABS: 1.0000 mg | ORAL_TABLET | Freq: Every day | ORAL | 3 refills | Status: DC

## 2016-09-01 NOTE — Telephone Encounter (Signed)
Dr. Janese Banks had spoke to pt about her mammoprint results and it was low and she can start on the AI that Dr. Janese Banks rec:.  She states she already got a call from pharmacy that is ready. She will pick it up tom. And start it on Thursday this week.  Dr. Janese Banks wants her to come in 6 weeks to see md with labs. I have sent message to schedulers and asked if we can send an appt card in the mail as reminder and she is agreeable to the plan

## 2016-09-01 NOTE — Telephone Encounter (Signed)
-----   Message from Sindy Guadeloupe, MD sent at 09/01/2016 11:31 AM EDT ----- Please let patient know her mammaprint came back with low risk. She does not need adjuvant chemotherapy like I discussed. I have prescribed arimidex to her pharmacy. I will see ehr in 6 weeks with cmp.

## 2016-09-03 ENCOUNTER — Other Ambulatory Visit: Payer: Self-pay | Admitting: *Deleted

## 2016-09-03 DIAGNOSIS — Z170 Estrogen receptor positive status [ER+]: Principal | ICD-10-CM

## 2016-09-03 DIAGNOSIS — C50911 Malignant neoplasm of unspecified site of right female breast: Secondary | ICD-10-CM

## 2016-10-01 ENCOUNTER — Other Ambulatory Visit: Payer: Medicare HMO

## 2016-10-01 ENCOUNTER — Ambulatory Visit: Payer: Medicare HMO | Admitting: Oncology

## 2016-10-02 ENCOUNTER — Encounter: Payer: Self-pay | Admitting: Oncology

## 2016-10-06 ENCOUNTER — Other Ambulatory Visit: Payer: Self-pay

## 2016-10-06 DIAGNOSIS — Z170 Estrogen receptor positive status [ER+]: Principal | ICD-10-CM

## 2016-10-06 DIAGNOSIS — C50911 Malignant neoplasm of unspecified site of right female breast: Secondary | ICD-10-CM

## 2016-10-08 ENCOUNTER — Inpatient Hospital Stay (HOSPITAL_BASED_OUTPATIENT_CLINIC_OR_DEPARTMENT_OTHER): Payer: Medicare HMO | Admitting: Oncology

## 2016-10-08 ENCOUNTER — Encounter: Payer: Self-pay | Admitting: Oncology

## 2016-10-08 ENCOUNTER — Inpatient Hospital Stay: Payer: Medicare HMO | Attending: Oncology

## 2016-10-08 VITALS — BP 174/91 | HR 67 | Temp 97.7°F | Resp 18 | Wt 224.3 lb

## 2016-10-08 DIAGNOSIS — C50911 Malignant neoplasm of unspecified site of right female breast: Secondary | ICD-10-CM

## 2016-10-08 DIAGNOSIS — C50211 Malignant neoplasm of upper-inner quadrant of right female breast: Secondary | ICD-10-CM

## 2016-10-08 DIAGNOSIS — Z170 Estrogen receptor positive status [ER+]: Secondary | ICD-10-CM | POA: Diagnosis not present

## 2016-10-08 DIAGNOSIS — M8580 Other specified disorders of bone density and structure, unspecified site: Secondary | ICD-10-CM | POA: Insufficient documentation

## 2016-10-08 DIAGNOSIS — Z9011 Acquired absence of right breast and nipple: Secondary | ICD-10-CM | POA: Insufficient documentation

## 2016-10-08 DIAGNOSIS — Z79811 Long term (current) use of aromatase inhibitors: Secondary | ICD-10-CM | POA: Insufficient documentation

## 2016-10-08 DIAGNOSIS — I10 Essential (primary) hypertension: Secondary | ICD-10-CM

## 2016-10-08 DIAGNOSIS — K219 Gastro-esophageal reflux disease without esophagitis: Secondary | ICD-10-CM | POA: Diagnosis not present

## 2016-10-08 LAB — COMPREHENSIVE METABOLIC PANEL
ALBUMIN: 4 g/dL (ref 3.5–5.0)
ALT: 14 U/L (ref 14–54)
AST: 22 U/L (ref 15–41)
Alkaline Phosphatase: 57 U/L (ref 38–126)
Anion gap: 7 (ref 5–15)
BILIRUBIN TOTAL: 0.6 mg/dL (ref 0.3–1.2)
BUN: 14 mg/dL (ref 6–20)
CALCIUM: 9.5 mg/dL (ref 8.9–10.3)
CO2: 28 mmol/L (ref 22–32)
CREATININE: 0.93 mg/dL (ref 0.44–1.00)
Chloride: 105 mmol/L (ref 101–111)
GFR calc Af Amer: >60 mL/min (ref >60)
GLUCOSE: 180 mg/dL — AB (ref 65–99)
POTASSIUM: 3.7 mmol/L (ref 3.5–5.1)
Sodium: 140 mmol/L (ref 135–145)
TOTAL PROTEIN: 7.7 g/dL (ref 6.5–8.1)

## 2016-10-08 NOTE — Progress Notes (Signed)
Hematology/Oncology Consult note [facility]  Telephone:([phone] [phone]  Patient Care Team: Pleas Koch, NP as PCP - General (Internal Medicine)   Name of the patient: Monica Villanueva  [id number]  1945-12-14   Date of visit: 10/08/16  Diagnosis- 2 foci of invasive breast cancer in the right breast Stage I pT1c pn1(mi)sn cM0 ER/PR positive and Her2 negative   Chief complaint/ Reason for visit- assess tolerance to arimidex  Heme/Onc history: 1. Patient is a 71 year old female who underwent bilateral screening mammogram on 36 MT 18 which showed masses in bilateral breasts requiring further evaluation.  2. Diagnostic bilateral mammogram on 07/10/2016 showed:IMPRESSION:1. Findings suspicious for multicentric carcinoma in the right breast. There is a suspicious irregular mass in the upper inner quadrant at 2 o'clock position 7 cm from the nipple and there are extensive pleomorphic calcifications in the outer right breast, involving both the lower outer quadrant and upper outer quadrant. 2. Probable fibroadenoma 10:30 position upper-outer quadrant right breast. 3. Probable complicated cyst 3 o'clock retroareolar left breast.4. Probable sebaceous cyst left axilla.  3. Patient underwent core biopsy of both the right breast lesions as well as ultrasound-guided aspiration of left breast cyst. Pathology showed:  DIAGNOSIS:  A. BREAST, RIGHT, LOWER OUTER QUADRANT; STEREOTACTIC-GUIDED CORE BIOPSY:  - MICROINVASIVE LOBULAR CARCINOMA, 0.95 MM.  - DUCTAL CARCINOMA IN SITU, INTERMEDIATE NUCLEAR GRADE WITH FOCAL  NECROSIS, ASSOCIATED WITH CALCIFICATIONS.   B. BREAST, RIGHT, 2 O'CLOCK, 7 CM FROM NIPPLE; ULTRASOUND-GUIDED CORE  BIOPSY:  - INVASIVE MAMMARY CARCINOMA, NO SPECIAL TYPE.   4. At baseline patient is in good health and reports no significant comorbidities. She does not take any other medications except multivitamins. No family history of  breast cancer. Menarche at the age of 68 and menopause in 33. She did not breast-feed and used oral contraceptive pills remotely. She is G2 P2 L2.  5. Patient underwent right simple mastectomy with SLNB on 4/2/418. Pathology showed:  DIAGNOSIS:  A. SENTINEL LYMPH NODE; EXCISION:  - ONE LYMPH NODE NEGATIVE FOR MALIGNANCY (0/1).   B. BREAST; SIMPLE MASTECTOMY:  - INVASIVE MAMMARY CARCINOMA, NO SPECIAL TYPE (2:00 LOCATION).  - INVASIVE LOBULAR CARCINOMA ARISING IN AREA OF EXTENSIVE DUCTAL  CARCINOMA IN SITU WITH ADJACENT LOBULAR CARCINOMA IN SITU (6:00  LOCATION).  - MICRO METASTATIC CARCINOMA INVOLVES ONE OF THREE LYMPH NODES (1/3).  - SEE CANCER SUMMARY BELOW.  - FIBROADENOMA.  - NIPPLE WITH INTRADUCTAL PAPILLOMA.  - TWO SEPARATE BIOPSY SITES WITH METALLIC MARKERS.  - SKIN WITH KELOID.   Surgical Pathology Cancer Case Summary   INVASIVE CARCINOMA OF THE BREAST  Procedure: Simple mastectomy  Specimen Laterality: Right  Histologic Type: Invasive mammary carcinoma of no special type  invasive lobular carcinoma  Histologic Grade (Nottingham Histologic Score)       Glandular (Acinar)/Tubular Differentiation: 3       Nuclear Pleomorphism: 2       Mitotic Rate: 1       Overall Grade: 2  Tumor Size: 15 mm (invasive mammary no special type); 4 mm (invasive  lobular)  Ductal Carcinoma In Situ (DCIS): Present  Margins:    Invasive carcinoma: Negative for invasive carcinoma       Distance from closest margin: 17 mm to the deep    Ductal carcinoma in situ: Negative for invasive carcinoma       Distance from closest margin: 17 mm deep  Regional Lymph nodes:    Total # lymph nodes examined: 4    #  Sentinel lymph nodes examined: 1    # Lymph nodes with macrometastasis (>2.0 mm): 0    # Lymph nodes with isolated tumor cells (<0.2 mm): 0    # Lymph nodes with micrometastasis (> 0.2 mm and < 2.0 mm): 1    Extranodal extension:  Notidentified  Treatment Effect: No known pre-surgical therapy  Lymphovascular Invasion: Not identified  Pathologic Stage Classification (pTNM, AJCC 8th Edition): pT1c pN48m  (sn)  TNM Descriptors: (m) multifocal    BREAST BIOMARKER TESTS - performed on prior biopsy of invasive mammary  carcinoma no special type (2:00 lesion)  Estrogen Receptor (ER) Status: POSITIVE, >90% nuclear staining  Progesterone Receptor (PgR) Status: POSITIVE, >90% nuclear staining  HER2 (by immunohistochemistry): Equivocal (Score 2+)  Percentage of cells with uniform intense complete membrane staining: 5%  HER2 (ERBB2) (by in situ hybridization): NEGATIVE   6. mammaprint came back as low risk and she did not require adjuvant chemotherapy. Started on arimidex on 09/03/16. Baseline bone density scan showed osteopenia    Interval history- tolerating arimidex well without significant arthralgias. No fatigue. She has some itching at the site of breast drains. Denies other complaints  ECOG PS- 1 Pain scale- 0   Review of systems- Review of Systems  Constitutional: Negative for chills, fever, malaise/fatigue and weight loss.  HENT: Negative for congestion, ear discharge and nosebleeds.   Eyes: Negative for blurred vision.  Respiratory: Negative for cough, hemoptysis, sputum production, shortness of breath and wheezing.   Cardiovascular: Negative for chest pain, palpitations, orthopnea and claudication.  Gastrointestinal: Negative for abdominal pain, blood in stool, constipation, diarrhea, heartburn, melena, nausea and vomiting.  Genitourinary: Negative for dysuria, flank pain, frequency, hematuria and urgency.  Musculoskeletal: Negative for back pain, joint pain and myalgias.  Skin: Positive for itching. Negative for rash.  Neurological: Negative for dizziness, tingling, focal weakness, seizures, weakness and headaches.  Endo/Heme/Allergies: Does not bruise/bleed easily.  Psychiatric/Behavioral: Negative for  depression and suicidal ideas. The patient does not have insomnia.        No Known Allergies   Past Medical History:  Diagnosis Date  . [place]   . GERD (gastroesophageal reflux disease)    OCC  . Keloid of skin    right axilla     Past Surgical History:  Procedure Laterality Date  . ABDOMINAL HYSTERECTOMY  1996  . BREAST BIOPSY Right 07/15/2016   stereo bx of calcs LOQ-path pending  . BREAST BIOPSY Right 07/15/2016   uKoreabx of mass-path pending  . BREAST CYST ASPIRATION Left 07/15/2016  . COLONOSCOPY    . SENTINEL NODE BIOPSY Right 08/11/2016   Procedure: SENTINEL NODE BIOPSY;  Surgeon: JLeonie Green MD;  Location: [facility] ORS;  Service: General;  Laterality: Right;  . SIMPLE MASTECTOMY WITH AXILLARY SENTINEL NODE BIOPSY Right 08/11/2016   Procedure: SIMPLE MASTECTOMY;  Surgeon: JLeonie Green MD;  Location: [facility] ORS;  Service: General;  Laterality: Right;    Social History   Social History  . Marital status: Widowed    Spouse name: N/A  . Number of children: N/A  . Years of education: N/A   Occupational History  . Not on file.   Social History Main Topics  . Smoking status: Never Smoker  . Smokeless tobacco: Never Used  . Alcohol use Yes     Comment: OCC  . Drug use: No  . Sexual activity: Not on file   Other Topics Concern  . Not on file   Social History Narrative  Widowed.   2 children, 4 grandchildren.   Retired. Previously worked at [employer].   Enjoys writing, playing on the key boards.    Family History  Problem Relation Age of Onset  . Hypertension Mother   . Hypertension Sister   . Breast cancer Neg Hx      Current Outpatient Prescriptions:  .  anastrozole (ARIMIDEX) 1 MG tablet, Take 1 tablet (1 mg total) by mouth daily., Disp: 30 tablet, Rfl: 3 .  calcium carbonate (TUMS - DOSED IN MG ELEMENTAL CALCIUM) 500 MG chewable tablet, Chew 1 tablet by mouth as needed for indigestion or heartburn., Disp: , Rfl:   .  Calcium-Magnesium-Vitamin D (CALCIUM 1200+D3 PO), Take 2 tablets by mouth daily. Calcium 1200 units / Vit D 800 mg, Disp: , Rfl:   Physical exam:  Vitals:   10/08/16 0834  BP: (!) 174/91  Pulse: 67  Resp: 18  Temp: 97.7 F (36.5 C)  TempSrc: Tympanic  Weight: 224 lb 4.8 oz (101.7 kg)   Physical Exam  Constitutional: She is oriented to person, place, and time.  Obese, appears in no acute distress  HENT:  Head: Normocephalic and atraumatic.  Eyes: EOM are normal. Pupils are equal, round, and reactive to light.  Neck: Normal range of motion.  Cardiovascular: Normal rate, regular rhythm and normal heart sounds.   Pulmonary/Chest: Effort normal and breath sounds normal.  Abdominal: Soft. Bowel sounds are normal.  Neurological: She is alert and oriented to person, place, and time.  Skin: Skin is warm and dry.   Patient is status post right mastectomy without reconstruction. No evidence of chest wall recurrence. No palpable right axillary adenopathy. Mastectomy site has healed well and there is no evidence of infection  CMP Latest Ref Rng & Units 08/18/2016  Glucose 65 - 99 mg/dL 192(H)  BUN 6 - 20 mg/dL 11  Creatinine 0.44 - 1.00 mg/dL 0.70  Sodium 135 - 145 mmol/L 139  Potassium 3.5 - 5.1 mmol/L 4.3  Chloride 101 - 111 mmol/L 104  CO2 22 - 32 mmol/L 30  Calcium 8.9 - 10.3 mg/dL 9.2  Total Protein 6.5 - 8.1 g/dL 7.1  Total Bilirubin 0.3 - 1.2 mg/dL 0.6  Alkaline Phos 38 - 126 U/L 61  AST 15 - 41 U/L 17  ALT 14 - 54 U/L 14   CBC Latest Ref Rng & Units 08/18/2016  WBC 3.6 - 11.0 K/uL 9.7  Hemoglobin 12.0 - 16.0 g/dL 11.1(L)  Hematocrit 35.0 - 47.0 % 32.7(L)  Platelets 150 - 440 K/uL 229      Assessment and plan- Patient is a 71 y.o. female  foci of invasive breast cancer in the right breast Stage I pT1c pn1(mi)sn cM0 ER/PR positive and Her2 negative status post right mastectomy and currently on Arimidex  1. Patient will continue Arimidex at least for 5 years. She will  also continue calcium 1200 mg and vitamin D 800 international units. She does have baseline osteopenia but does not require his phosphates at this time. 2. I will refer her to OT to recommend prophylactic exercises given right mastectomy and potential for lymphedema 3. Uncontrolled hypertension-she needs to address this with her primary care provider  I will see her back in 3 months     Visit Diagnosis 1. Malignant neoplasm of right breast in female, estrogen receptor positive, unspecified site of breast [facility])      Dr. Randa Evens, MD, MPH [place] at [facility] Pager- [phone] 10/08/2016  9:10 AM

## 2016-10-08 NOTE — Progress Notes (Signed)
Here for follow up  Repeat BP 166/86  p67  Pt asymptomatic.

## 2016-10-12 DIAGNOSIS — C50111 Malignant neoplasm of central portion of right female breast: Secondary | ICD-10-CM | POA: Diagnosis not present

## 2016-10-12 DIAGNOSIS — Z4431 Encounter for fitting and adjustment of external right breast prosthesis: Secondary | ICD-10-CM | POA: Diagnosis not present

## 2016-10-27 DIAGNOSIS — Z4431 Encounter for fitting and adjustment of external right breast prosthesis: Secondary | ICD-10-CM | POA: Diagnosis not present

## 2016-10-27 DIAGNOSIS — C50111 Malignant neoplasm of central portion of right female breast: Secondary | ICD-10-CM | POA: Diagnosis not present

## 2016-12-21 ENCOUNTER — Other Ambulatory Visit: Payer: Self-pay | Admitting: Oncology

## 2017-01-07 ENCOUNTER — Inpatient Hospital Stay: Payer: Medicare HMO | Attending: Oncology | Admitting: Oncology

## 2017-01-07 ENCOUNTER — Encounter: Payer: Self-pay | Admitting: Oncology

## 2017-01-07 VITALS — BP 169/90 | HR 61 | Temp 98.6°F | Resp 12 | Ht 62.0 in | Wt 227.6 lb

## 2017-01-07 DIAGNOSIS — M8580 Other specified disorders of bone density and structure, unspecified site: Secondary | ICD-10-CM | POA: Diagnosis not present

## 2017-01-07 DIAGNOSIS — Z79811 Long term (current) use of aromatase inhibitors: Secondary | ICD-10-CM | POA: Insufficient documentation

## 2017-01-07 DIAGNOSIS — Z170 Estrogen receptor positive status [ER+]: Secondary | ICD-10-CM

## 2017-01-07 DIAGNOSIS — K219 Gastro-esophageal reflux disease without esophagitis: Secondary | ICD-10-CM | POA: Diagnosis not present

## 2017-01-07 DIAGNOSIS — C50211 Malignant neoplasm of upper-inner quadrant of right female breast: Secondary | ICD-10-CM | POA: Diagnosis not present

## 2017-01-07 DIAGNOSIS — I10 Essential (primary) hypertension: Secondary | ICD-10-CM | POA: Insufficient documentation

## 2017-01-07 DIAGNOSIS — C50911 Malignant neoplasm of unspecified site of right female breast: Secondary | ICD-10-CM

## 2017-01-07 DIAGNOSIS — Z9011 Acquired absence of right breast and nipple: Secondary | ICD-10-CM

## 2017-01-07 NOTE — Progress Notes (Signed)
Repeat b/p 150/82

## 2017-01-07 NOTE — Progress Notes (Signed)
Patient here for follow up. No changes  She is still feeling some numbness at the surgical site.

## 2017-01-07 NOTE — Progress Notes (Signed)
Hematology/Oncology Consult note [facility]  [phone] [phone]  Patient Care Team: Pleas Koch, NP as PCP - General (Internal Medicine)   Name of the patient: Monica Villanueva  [id number]  [birth year]   Date of visit: 01/07/17  Diagnosis- 2 foci of invasive breast cancer in the right breast Stage I pT1c pn1(mi)sn cM0 ER/PR positive and Her2 negative   Chief complaint/ Reason for visit- assess tolerance to arimidex  Heme/Onc history: 1. Patient is a 71 year old female who underwent bilateral screening mammogram on 36 MT 18 which showed masses in bilateral breasts requiring further evaluation.  2. Diagnostic bilateral mammogram on 07/10/2016 showed:IMPRESSION:1. Findings suspicious for multicentric carcinoma in the right breast. There is a suspicious irregular mass in the upper inner quadrant at 2 o'clock position 7 cm from the nipple and there are extensive pleomorphic calcifications in the outer right breast, involving both the lower outer quadrant and upper outer quadrant. 2. Probable fibroadenoma 10:30 position upper-outer quadrant right breast. 3. Probable complicated cyst 3 o'clock retroareolar left breast.4. Probable sebaceous cyst left axilla.  3. Patient underwent core biopsy of both the right breast lesions as well as ultrasound-guided aspiration of left breast cyst. Pathology showed:  DIAGNOSIS:  A. BREAST, RIGHT, LOWER OUTER QUADRANT; STEREOTACTIC-GUIDED CORE BIOPSY:  - MICROINVASIVE LOBULAR CARCINOMA, 0.95 MM.  - DUCTAL CARCINOMA IN SITU, INTERMEDIATE NUCLEAR GRADE WITH FOCAL  NECROSIS, ASSOCIATED WITH CALCIFICATIONS.   B. BREAST, RIGHT, 2 O'CLOCK, 7 CM FROM NIPPLE; ULTRASOUND-GUIDED CORE  BIOPSY:  - INVASIVE MAMMARY CARCINOMA, NO SPECIAL TYPE.   4. At baseline patient is in good health and reports no significant comorbidities. She does not take any other medications except multivitamins. No family history of  breast cancer. Menarche at the age of 89 and menopause in 76. She did not breast-feed and used oral contraceptive pills remotely. She is G2 P2 L2.  5. Patient underwent right simple mastectomy with SLNB on 4/2/418. Pathology showed:  DIAGNOSIS:  A. SENTINEL LYMPH NODE; EXCISION:  - ONE LYMPH NODE NEGATIVE FOR MALIGNANCY (0/1).   B. BREAST; SIMPLE MASTECTOMY:  - INVASIVE MAMMARY CARCINOMA, NO SPECIAL TYPE (2:00 LOCATION).  - INVASIVE LOBULAR CARCINOMA ARISING IN AREA OF EXTENSIVE DUCTAL  CARCINOMA IN SITU WITH ADJACENT LOBULAR CARCINOMA IN SITU (6:00  LOCATION).  - MICRO METASTATIC CARCINOMA INVOLVES ONE OF THREE LYMPH NODES (1/3).  - SEE CANCER SUMMARY BELOW.  - FIBROADENOMA.  - NIPPLE WITH INTRADUCTAL PAPILLOMA.  - TWO SEPARATE BIOPSY SITES WITH METALLIC MARKERS.  - SKIN WITH KELOID.   Surgical Pathology Cancer Case Summary   INVASIVE CARCINOMA OF THE BREAST  Procedure: Simple mastectomy  Specimen Laterality: Right  Histologic Type: Invasive mammary carcinoma of no special type  invasive lobular carcinoma  Histologic Grade (Nottingham Histologic Score)       Glandular (Acinar)/Tubular Differentiation: 3       Nuclear Pleomorphism: 2       Mitotic Rate: 1       Overall Grade: 2  Tumor Size: 15 mm (invasive mammary no special type); 4 mm (invasive  lobular)  Ductal Carcinoma In Situ (DCIS): Present  Margins:    Invasive carcinoma: Negative for invasive carcinoma       Distance from closest margin: 17 mm to the deep    Ductal carcinoma in situ: Negative for invasive carcinoma       Distance from closest margin: 17 mm deep  Regional Lymph nodes:    Total # lymph nodes examined: 4    #  Sentinel lymph nodes examined: 1    # Lymph nodes with macrometastasis (>2.0 mm): 0    # Lymph nodes with isolated tumor cells (<0.2 mm): 0    # Lymph nodes with micrometastasis (> 0.2 mm and < 2.0 mm): 1    Extranodal extension:  Notidentified  Treatment Effect: No known pre-surgical therapy  Lymphovascular Invasion: Not identified  Pathologic Stage Classification (pTNM, AJCC 8th Edition): pT1c pN24m  (sn)  TNM Descriptors: (m) multifocal    BREAST BIOMARKER TESTS - performed on prior biopsy of invasive mammary  carcinoma no special type (2:00 lesion)  Estrogen Receptor (ER) Status: POSITIVE, >90% nuclear staining  Progesterone Receptor (PgR) Status: POSITIVE, >90% nuclear staining  HER2 (by immunohistochemistry): Equivocal (Score 2+)  Percentage of cells with uniform intense complete membrane staining: 5%  HER2 (ERBB2) (by in situ hybridization): NEGATIVE   6. mammaprint came back as low risk and she did not require adjuvant chemotherapy. Started on arimidex on 09/03/16. Baseline bone density scan showed osteopenia.   Interval history-  Continues to tolerate Arimidex without complications. Denies arthralgias, fatigue, or hot flashes. She says she is tolerating Arimidex well. She last saw surgery, Dr. STamala Julian in June. She is right arm restricted. She wears a prosthesis and declined reconstruction. She denies swelling in right arm and uses an exercise book provided by surgery to prevent lymphedema. She complains of mild pulling type pain above her surgical site occassionally but it does not bother her. She is accompanied today by her daughter. Her last bone density scan was in March which showed osteopenia. She continues calcium and vitamin d. Her blood pressure is elevated today. She says she becomes anxious when she comes to these appointments but has not needed medication in the past.   ECOG PS- 1 Pain scale- 0   Review of systems- Review of Systems  Constitutional: Negative for chills, fever, malaise/fatigue and weight loss.  HENT: Negative for congestion, ear discharge and nosebleeds.   Eyes: Negative for blurred vision.  Respiratory: Negative for cough, hemoptysis, sputum production, shortness of breath and  wheezing.   Cardiovascular: Negative for chest pain, palpitations, orthopnea and claudication.  Gastrointestinal: Negative for abdominal pain, blood in stool, constipation, diarrhea, heartburn, melena, nausea and vomiting.  Genitourinary: Negative for dysuria, flank pain, frequency, hematuria and urgency.  Musculoskeletal: Negative for back pain, joint pain and myalgias.  Skin: Negative for itching and rash.  Neurological: Negative for dizziness, tingling, focal weakness, seizures, weakness and headaches.  Endo/Heme/Allergies: Does not bruise/bleed easily.  Psychiatric/Behavioral: Negative for depression and suicidal ideas. The patient does not have insomnia.      No Known Allergies   Past Medical History:  Diagnosis Date  . [place] ([place]   . GERD (gastroesophageal reflux disease)    OCC  . Keloid of skin    right axilla    Past Surgical History:  Procedure Laterality Date  . ABDOMINAL HYSTERECTOMY  1996  . BREAST BIOPSY Right 07/15/2016   stereo bx of calcs LOQ-path pending  . BREAST BIOPSY Right 07/15/2016   uKoreabx of mass-path pending  . BREAST CYST ASPIRATION Left 07/15/2016  . COLONOSCOPY    . SENTINEL NODE BIOPSY Right 08/11/2016   Procedure: SENTINEL NODE BIOPSY;  Surgeon: JLeonie Green MD;  Location: [facility] ORS;  Service: General;  Laterality: Right;  . SIMPLE MASTECTOMY WITH AXILLARY SENTINEL NODE BIOPSY Right 08/11/2016   Procedure: SIMPLE MASTECTOMY;  Surgeon: JLeonie Green MD;  Location: [facility] ORS;  Service: General;  Laterality:  Right;    Social History   Social History  . Marital status: Widowed    Spouse name: N/A  . Number of children: N/A  . Years of education: N/A   Occupational History  . Not on file.   Social History Main Topics  . Smoking status: Never Smoker  . Smokeless tobacco: Never Used  . Alcohol use Yes     Comment: OCC  . Drug use: No  . Sexual activity: Not on file   Other Topics Concern  . Not on file   Social History  Narrative   Widowed.   2 children, 4 grandchildren.   Retired. Previously worked at [employer].   Enjoys writing, playing on the key boards.    Family History  Problem Relation Age of Onset  . Hypertension Mother   . Hypertension Sister   . Breast cancer Neg Hx      Current Outpatient Prescriptions:  .  anastrozole (ARIMIDEX) 1 MG tablet, TAKE 1 TABLET BY MOUTH EVERY DAY, Disp: 30 tablet, Rfl: 3 .  calcium carbonate (TUMS - DOSED IN MG ELEMENTAL CALCIUM) 500 MG chewable tablet, Chew 1 tablet by mouth as needed for indigestion or heartburn., Disp: , Rfl:  .  Calcium-Magnesium-Vitamin D (CALCIUM 1200+D3 PO), Take 2 tablets by mouth daily. Calcium 1200 units / Vit D 800 mg, Disp: , Rfl:   Physical exam:  Vitals:   01/07/17 1335  BP: (!) 169/90  Pulse: 61  Resp: 12  Temp: 98.6 F (37 C)  TempSrc: Tympanic  Weight: 227 lb 9.6 oz (103.2 kg)  Height: 5' 2"  (1.575 m)   Physical Exam  Constitutional: She is oriented to person, place, and time.  Obese, appears in no acute distress  HENT:  Head: Normocephalic and atraumatic.  Eyes: Pupils are equal, round, and reactive to light. EOM are normal.  Neck: Normal range of motion.  Cardiovascular: Normal rate, regular rhythm and normal heart sounds.   Pulmonary/Chest: Effort normal and breath sounds normal.  Abdominal: Soft. Bowel sounds are normal.  Neurological: She is alert and oriented to person, place, and time.  Skin: Skin is warm and dry.   Patient is status post right mastectomy without reconstruction. No evidence of chest wall recurrence. No palpable right axillary adenopathy. Mastectomy site has healed well, without erythema and there is no evidence of infection.   CMP Latest Ref Rng & Units 10/08/2016  Glucose 65 - 99 mg/dL 180(H)  BUN 6 - 20 mg/dL 14  Creatinine 0.44 - 1.00 mg/dL 0.93  Sodium 135 - 145 mmol/L 140  Potassium 3.5 - 5.1 mmol/L 3.7  Chloride 101 - 111 mmol/L 105  CO2 22 - 32 mmol/L 28    Calcium 8.9 - 10.3 mg/dL 9.5  Total Protein 6.5 - 8.1 g/dL 7.7  Total Bilirubin 0.3 - 1.2 mg/dL 0.6  Alkaline Phos 38 - 126 U/L 57  AST 15 - 41 U/L 22  ALT 14 - 54 U/L 14   CBC Latest Ref Rng & Units 08/18/2016  WBC 3.6 - 11.0 K/uL 9.7  Hemoglobin 12.0 - 16.0 g/dL 11.1(L)  Hematocrit 35.0 - 47.0 % 32.7(L)  Platelets 150 - 440 K/uL 229      Assessment and plan- Patient is a 71 y.o. female  foci of invasive breast cancer in the right breast Stage I pT1c pn1(mi)sn cM0 ER/PR positive and Her2 negative status post right mastectomy and currently on Arimidex  1. Patient will continue Arimidex at least for 5  years completing therapy 2023. She will also continue calcium 1200 mg and vitamin D 800 international units. She does have baseline osteopenia but does not require bisphosphonates at this time. She sees surgery again on October 1st. Will plan to see her back in 4 months to stagger visits with surgery. Will repeat bone density scan 06/2018. Mammogram last performed 3/18. Will defer scheduling to surgery.  2. She declines physical therapy for lymphedema prophylaxis at this time. No evidence of on today's exam. She requests to continue home exercises at this time however can consider referral in the future if requested given her history of right mastectomy.  3. Uncontrolled hypertension- patient' sblood pressure elevated today. Initially 169/90, decreased to 150/82 on re-check. Chart review shows mildly elevated BPs. Discussed lifestyle modifications and home checks with patient. Request that PCP continue to follow her for this.  4. Elevated blood sugars- on chart review, patient has had several instances of elevated blood sugars. Request that PCP follow her for this.   I will see her back in 4 months with cbc and cmet at that time.      Visit Diagnosis 1. Malignant neoplasm of right breast in female, estrogen receptor positive, unspecified site of breast ([place])     Beckey Rutter,  NP 01/07/17 2:44 PM  Dr. Randa Evens, MD, MPH [place] at [facility] Pager- [phone] 01/07/2017 2:43 PM

## 2017-01-08 ENCOUNTER — Telehealth: Payer: Self-pay | Admitting: Primary Care

## 2017-01-08 NOTE — Telephone Encounter (Signed)
Please call patient and notify her that I received her recent report from her oncologist. It looks like we need to get her in for blood pressure evaluation and to recheck her A1c. Please schedule her for an appointment at her convenience.

## 2017-01-11 NOTE — Telephone Encounter (Signed)
Message left for patient to return my call.  

## 2017-01-13 NOTE — Telephone Encounter (Signed)
Spoken and notified patient of Kate's comments. Patient verbalized understanding.  Follow up on 01/21/2017

## 2017-01-18 DIAGNOSIS — Z853 Personal history of malignant neoplasm of breast: Secondary | ICD-10-CM | POA: Diagnosis not present

## 2017-01-21 ENCOUNTER — Ambulatory Visit (INDEPENDENT_AMBULATORY_CARE_PROVIDER_SITE_OTHER): Payer: Medicare HMO | Admitting: Primary Care

## 2017-01-21 ENCOUNTER — Ambulatory Visit: Payer: Medicare HMO | Admitting: Primary Care

## 2017-01-21 ENCOUNTER — Encounter: Payer: Self-pay | Admitting: Primary Care

## 2017-01-21 VITALS — BP 148/78 | HR 60 | Temp 98.7°F | Ht 62.0 in | Wt 224.8 lb

## 2017-01-21 DIAGNOSIS — E119 Type 2 diabetes mellitus without complications: Secondary | ICD-10-CM | POA: Insufficient documentation

## 2017-01-21 DIAGNOSIS — R739 Hyperglycemia, unspecified: Secondary | ICD-10-CM | POA: Diagnosis not present

## 2017-01-21 DIAGNOSIS — E1165 Type 2 diabetes mellitus with hyperglycemia: Secondary | ICD-10-CM | POA: Insufficient documentation

## 2017-01-21 DIAGNOSIS — I10 Essential (primary) hypertension: Secondary | ICD-10-CM | POA: Insufficient documentation

## 2017-01-21 LAB — HEMOGLOBIN A1C: Hgb A1c MFr Bld: 7.5 % — ABNORMAL HIGH (ref 4.6–6.5)

## 2017-01-21 MED ORDER — AMLODIPINE BESYLATE 10 MG PO TABS: 10.0000 mg | ORAL_TABLET | Freq: Every day | ORAL | 0 refills | Status: DC

## 2017-01-21 NOTE — Progress Notes (Signed)
Subjective:    Patient ID: Monica Villanueva, female    DOB: 05-07-1945, 71 y.o.   MRN: [id number]  HPI  Monica Villanueva is a 71 year old female who presents today for evaluation of hypertension.   She's been noted to have hypertensive readings during numerous office visits with her oncologist ranging 140-170/90's since late April 2018. Her BP in the office today is 169/90. She checked her BP at home this morning which was 140/71, last night 153/82, one week ago 169/89. She denies chest pain, dizziness, headaches. She does experience intermittent ankle edema which will improve with elevation.   BP Readings from Last 3 Encounters:  01/07/17 (!) 169/90  10/08/16 (!) 174/91  08/18/16 (!) 172/92     Review of Systems  Eyes: Negative for visual disturbance.  Respiratory: Negative for shortness of breath.   Cardiovascular: Negative for chest pain.  Neurological: Negative for dizziness and headaches.       Past Medical History:  Diagnosis Date  . Cancer ([place])   . GERD (gastroesophageal reflux disease)    OCC  . Keloid of skin    right axilla     Social History   Social History  . Marital status: Widowed    Spouse name: N/A  . Number of children: N/A  . Years of education: N/A   Occupational History  . Not on file.   Social History Main Topics  . Smoking status: Never Smoker  . Smokeless tobacco: Never Used  . Alcohol use Yes     Comment: OCC  . Drug use: No  . Sexual activity: Not on file   Other Topics Concern  . Not on file   Social History Narrative   Widowed.   2 children, 4 grandchildren.   Retired. Previously worked at [employer].   Enjoys writing, playing on the key boards.    Past Surgical History:  Procedure Laterality Date  . ABDOMINAL HYSTERECTOMY  1996  . BREAST BIOPSY Right 07/15/2016   stereo bx of calcs LOQ-path pending  . BREAST BIOPSY Right 07/15/2016   [place] bx of mass-path pending  . BREAST CYST ASPIRATION Left 07/15/2016  .  COLONOSCOPY    . SENTINEL NODE BIOPSY Right 08/11/2016   Procedure: SENTINEL NODE BIOPSY;  Surgeon: Leonie Green, MD;  Location: [facility] OR[facility];  Service: General;  Laterality: Right;  . SIMPLE MASTECTOMY WITH AXILLARY SENTINEL NODE BIOPSY Right 08/11/2016   Procedure: SIMPLE MASTECTOMY;  Surgeon: Leonie Green, MD;  Location: [facility];  Service: General;  Laterality: Right;    Family History  Problem Relation Age of Onset  . Hypertension Mother   . Hypertension Sister   . Breast cancer Neg Hx     No Known Allergies  Current Outpatient Prescriptions on File Prior to Visit  Medication Sig Dispense Refill  . anastrozole (ARIMIDEX) 1 MG tablet TAKE 1 TABLET BY MOUTH EVERY DAY 30 tablet 3  . calcium carbonate (TUMS - DOSED IN MG ELEMENTAL CALCIUM) 500 MG chewable tablet Chew 1 tablet by mouth as needed for indigestion or heartburn.    . Calcium-Magnesium-Vitamin D (CALCIUM 1200+D3 PO) Take 2 tablets by mouth daily. Calcium 1200 units / Vit D 800 mg     No current facility-administered medications on file prior to visit.     BP (!) 148/78   Pulse 60   Temp 98.7 F (37.1 C) (Oral)   Ht 5\' 2"  (1.575 m)   Wt 224 lb 12.8 oz (102 kg)  SpO2 94%   BMI 41.12 kg/m    Objective:   Physical Exam  Constitutional: She appears well-nourished.  Neck: Neck supple.  Cardiovascular: Normal rate and regular rhythm.   Pulmonary/Chest: Effort normal and breath sounds normal.  Skin: Skin is warm and dry.          Assessment & Plan:

## 2017-01-21 NOTE — Patient Instructions (Signed)
Start Amlodipine 10 mg tablets for high blood pressure. Take 1 tablet my mouth once daily.  Check your blood pressure daily, around the same time of day, for the next 2-3 weeks.  Ensure that you have rested for 30 minutes prior to checking your blood pressure. Record your readings and bring them to your next visit.  Complete lab work prior to leaving today. I will notify you of your results once received.   Schedule a follow up visit in 2-3 weeks for re-evaluation of your blood pressure.  It was a pleasure to see you today!

## 2017-01-21 NOTE — Assessment & Plan Note (Signed)
Noted on labs from cancer center ranging 160-190. Check A1C today.

## 2017-01-21 NOTE — Assessment & Plan Note (Signed)
Above goal on numerous office visits including today. Home readings elevated. Rx for Amlodipine 10 mg sent to pharmacy. Will have her start monitoring BP at home, record readings, bring readings to her follow up visit in 2-3 weeks.

## 2017-02-09 ENCOUNTER — Ambulatory Visit: Payer: Medicare HMO | Admitting: Primary Care

## 2017-02-12 ENCOUNTER — Ambulatory Visit (INDEPENDENT_AMBULATORY_CARE_PROVIDER_SITE_OTHER): Payer: Medicare HMO | Admitting: Primary Care

## 2017-02-12 DIAGNOSIS — I10 Essential (primary) hypertension: Secondary | ICD-10-CM | POA: Diagnosis not present

## 2017-02-12 DIAGNOSIS — E119 Type 2 diabetes mellitus without complications: Secondary | ICD-10-CM

## 2017-02-12 MED ORDER — AMLODIPINE BESYLATE 10 MG PO TABS: 10.0000 mg | ORAL_TABLET | Freq: Every day | ORAL | 3 refills | Status: DC

## 2017-02-12 NOTE — Assessment & Plan Note (Signed)
Improved on Amlodipine 10 mg, continue same. Commended her on improvements in diet and activity level.

## 2017-02-12 NOTE — Progress Notes (Signed)
Subjective:    Patient ID: Monica Villanueva, female    DOB: 1946/04/07, 71 y.o.   MRN: [id number]  HPI  Monica Villanueva is a 71 year old female who presents today for follow up of hypertension.  She was last evaluated in early October 2018 with evidence of numerous elevated blood pressure readings from different visits. She was initiated on Amlodipine 10 mg and asked to return for follow up.  BP Readings from Last 3 Encounters:  02/12/17 126/82  01/21/17 (!) 148/78  01/07/17 (!) 169/90    Her BP in the office today is 126/82. She's checking her BP at home and is getting readings of 120's-130's-70's/80's. She denies headaches, dizziness, chest pain. She has noticed mild ankle swelling, but has a history of this in the past.   Diet currently consists of:  Breakfast: Skips sometimes, boiled egg, scrambled egg, wheat toast, some cereal Lunch: Salad, vegetables (broccoli, cucumbers, tomatoes)  Dinner: Salad, chicken (baked), fish,  Snacks: Nuts Desserts: Occasionally  Beverages: Water, coffee.   Exercise: She is has recently been walking.    Review of Systems  Constitutional: Negative for fatigue.  Eyes: Negative for visual disturbance.  Respiratory: Negative for shortness of breath.   Cardiovascular: Negative for chest pain and leg swelling.  Neurological: Negative for dizziness, numbness and headaches.       Past Medical History:  Diagnosis Date  . Cancer ([place])   . GERD (gastroesophageal reflux disease)    OCC  . Keloid of skin    right axilla     Social History   Social History  . Marital status: Widowed    Spouse name: N/A  . Number of children: N/A  . Years of education: N/A   Occupational History  . Not on file.   Social History Main Topics  . Smoking status: Never Smoker  . Smokeless tobacco: Never Used  . Alcohol use Yes     Comment: OCC  . Drug use: No  . Sexual activity: Not on file   Other Topics Concern  . Not on file   Social History Narrative   Widowed.   2 children, 4 grandchildren.   Retired. Previously worked at [employer].   Enjoys writing, playing on the key boards.    Past Surgical History:  Procedure Laterality Date  . ABDOMINAL HYSTERECTOMY  1996  . BREAST BIOPSY Right 07/15/2016   stereo bx of calcs LOQ-path pending  . BREAST BIOPSY Right 07/15/2016   [place] bx of mass-path pending  . BREAST CYST ASPIRATION Left 07/15/2016  . COLONOSCOPY    . SENTINEL NODE BIOPSY Right 08/11/2016   Procedure: SENTINEL NODE BIOPSY;  Surgeon: Leonie Green, MD;  Location: [facility];  Service: General;  Laterality: Right;  . SIMPLE MASTECTOMY WITH AXILLARY SENTINEL NODE BIOPSY Right 08/11/2016   Procedure: SIMPLE MASTECTOMY;  Surgeon: Leonie Green, MD;  Location: [facility];  Service: General;  Laterality: Right;    Family History  Problem Relation Age of Onset  . Hypertension Mother   . Hypertension Sister   . Breast cancer Neg Hx     No Known Allergies  Current Outpatient Prescriptions on File Prior to Visit  Medication Sig Dispense Refill  . anastrozole (ARIMIDEX) 1 MG tablet TAKE 1 TABLET BY MOUTH EVERY DAY 30 tablet 3  . calcium carbonate (TUMS - DOSED IN MG ELEMENTAL CALCIUM) 500 MG chewable tablet Chew 1 tablet by mouth as needed for indigestion or heartburn.    Marland Kitchen  Calcium-Magnesium-Vitamin D (CALCIUM 1200+D3 PO) Take 2 tablets by mouth daily. Calcium 1200 units / Vit D 800 mg     No current facility-administered medications on file prior to visit.     BP 126/82   Pulse 68   Temp 98 F (36.7 C) (Oral)   Ht 5\' 2"  (1.575 m)   Wt 221 lb 12.8 oz (100.6 kg)   SpO2 98%   BMI 40.57 kg/m    Objective:   Physical Exam  Constitutional: She appears well-nourished.  Neck: Neck supple.  Cardiovascular: Normal rate and regular rhythm.   Pulmonary/Chest: Effort normal and breath sounds normal.  Skin: Skin is warm and dry.          Assessment & Plan:

## 2017-02-12 NOTE — Patient Instructions (Signed)
Continue Amlodipine 10 mg tablets for blood pressure.  Continue to work on improving your diet, continue regular exercise.  Schedule a follow up visit in early January to recheck your diabetes test.  It was a pleasure to see you today!  Diabetes Mellitus and Food It is important for you to manage your blood sugar (glucose) level. Your blood glucose level can be greatly affected by what you eat. Eating healthier foods in the appropriate amounts throughout the day at about the same time each day will help you control your blood glucose level. It can also help slow or prevent worsening of your diabetes mellitus. Healthy eating may even help you improve the level of your blood pressure and reach or maintain a healthy weight. General recommendations for healthful eating and cooking habits include:  Eating meals and snacks regularly. Avoid going long periods of time without eating to lose weight.  Eating a diet that consists mainly of plant-based foods, such as fruits, vegetables, nuts, legumes, and whole grains.  Using low-heat cooking methods, such as baking, instead of high-heat cooking methods, such as deep frying.  Work with your dietitian to make sure you understand how to use the Nutrition Facts information on food labels. How can food affect me? Carbohydrates Carbohydrates affect your blood glucose level more than any other type of food. Your dietitian will help you determine how many carbohydrates to eat at each meal and teach you how to count carbohydrates. Counting carbohydrates is important to keep your blood glucose at a healthy level, especially if you are using insulin or taking certain medicines for diabetes mellitus. Alcohol Alcohol can cause sudden decreases in blood glucose (hypoglycemia), especially if you use insulin or take certain medicines for diabetes mellitus. Hypoglycemia can be a life-threatening condition. Symptoms of hypoglycemia (sleepiness, dizziness, and  disorientation) are similar to symptoms of having too much alcohol. If your health care provider has given you approval to drink alcohol, do so in moderation and use the following guidelines:  Women should not have more than one drink per day, and men should not have more than two drinks per day. One drink is equal to: ? 12 oz of beer. ? 5 oz of wine. ? 1 oz of hard liquor.  Do not drink on an empty stomach.  Keep yourself hydrated. Have water, diet soda, or unsweetened iced tea.  Regular soda, juice, and other mixers might contain a lot of carbohydrates and should be counted.  What foods are not recommended? As you make food choices, it is important to remember that all foods are not the same. Some foods have fewer nutrients per serving than other foods, even though they might have the same number of calories or carbohydrates. It is difficult to get your body what it needs when you eat foods with fewer nutrients. Examples of foods that you should avoid that are high in calories and carbohydrates but low in nutrients include:  Trans fats (most processed foods list trans fats on the Nutrition Facts label).  Regular soda.  Juice.  Candy.  Sweets, such as cake, pie, doughnuts, and cookies.  Fried foods.  What foods can I eat? Eat nutrient-rich foods, which will nourish your body and keep you healthy. The food you should eat also will depend on several factors, including:  The calories you need.  The medicines you take.  Your weight.  Your blood glucose level.  Your blood pressure level.  Your cholesterol level.  You should eat a variety of  foods, including:  Protein. ? Lean cuts of meat. ? Proteins low in saturated fats, such as fish, egg whites, and beans. Avoid processed meats.  Fruits and vegetables. ? Fruits and vegetables that may help control blood glucose levels, such as apples, mangoes, and yams.  Dairy products. ? Choose fat-free or low-fat dairy products,  such as milk, yogurt, and cheese.  Grains, bread, pasta, and rice. ? Choose whole grain products, such as multigrain bread, whole oats, and [place] rice. These foods may help control blood pressure.  Fats. ? Foods containing healthful fats, such as nuts, avocado, olive oil, canola oil, and fish.  Does everyone with diabetes mellitus have the same meal plan? Because every person with diabetes mellitus is different, there is not one meal plan that works for everyone. It is very important that you meet with a dietitian who will help you create a meal plan that is just right for you. This information is not intended to replace advice given to you by your health care provider. Make sure you discuss any questions you have with your health care provider. Document Released: 01/01/2005 Document Revised: 09/12/2015 Document Reviewed: 03/03/2013 Elsevier Interactive Patient Education  2017 [employer].

## 2017-02-12 NOTE — Assessment & Plan Note (Signed)
Diagnosed in early October with A1C of 7.5. She's already started making significant changes in her diet and is active, commended her on this. Will repeat A1C in early January.

## 2017-03-18 ENCOUNTER — Other Ambulatory Visit: Payer: Self-pay | Admitting: *Deleted

## 2017-03-18 NOTE — Telephone Encounter (Signed)
Patient  has refill on current rx 

## 2017-03-22 ENCOUNTER — Other Ambulatory Visit: Payer: Self-pay | Admitting: *Deleted

## 2017-03-22 MED ORDER — ANASTROZOLE 1 MG PO TABS: 1.0000 mg | ORAL_TABLET | Freq: Every day | ORAL | 0 refills | Status: DC

## 2017-05-06 ENCOUNTER — Encounter: Payer: Self-pay | Admitting: Oncology

## 2017-05-06 ENCOUNTER — Inpatient Hospital Stay (HOSPITAL_BASED_OUTPATIENT_CLINIC_OR_DEPARTMENT_OTHER): Payer: Medicare HMO | Admitting: Oncology

## 2017-05-06 ENCOUNTER — Inpatient Hospital Stay: Payer: Medicare HMO | Attending: Oncology

## 2017-05-06 VITALS — BP 132/85 | HR 56 | Temp 97.7°F | Resp 18 | Wt 222.0 lb

## 2017-05-06 DIAGNOSIS — Z79811 Long term (current) use of aromatase inhibitors: Secondary | ICD-10-CM | POA: Insufficient documentation

## 2017-05-06 DIAGNOSIS — Z08 Encounter for follow-up examination after completed treatment for malignant neoplasm: Secondary | ICD-10-CM

## 2017-05-06 DIAGNOSIS — Z170 Estrogen receptor positive status [ER+]: Secondary | ICD-10-CM

## 2017-05-06 DIAGNOSIS — Z853 Personal history of malignant neoplasm of breast: Secondary | ICD-10-CM

## 2017-05-06 DIAGNOSIS — C50411 Malignant neoplasm of upper-outer quadrant of right female breast: Secondary | ICD-10-CM

## 2017-05-06 DIAGNOSIS — C50911 Malignant neoplasm of unspecified site of right female breast: Secondary | ICD-10-CM

## 2017-05-06 LAB — COMPREHENSIVE METABOLIC PANEL
ALK PHOS: 84 U/L (ref 38–126)
ALT: 15 U/L (ref 14–54)
AST: 20 U/L (ref 15–41)
Albumin: 3.9 g/dL (ref 3.5–5.0)
Anion gap: 8 (ref 5–15)
BILIRUBIN TOTAL: 0.5 mg/dL (ref 0.3–1.2)
BUN: 14 mg/dL (ref 6–20)
CALCIUM: 9.9 mg/dL (ref 8.9–10.3)
CO2: 29 mmol/L (ref 22–32)
Chloride: 100 mmol/L — ABNORMAL LOW (ref 101–111)
Creatinine, Ser: 0.73 mg/dL (ref 0.44–1.00)
GFR calc non Af Amer: >60 mL/min (ref >60)
GLUCOSE: 154 mg/dL — AB (ref 65–99)
Potassium: 3.8 mmol/L (ref 3.5–5.1)
Sodium: 137 mmol/L (ref 135–145)
TOTAL PROTEIN: 8 g/dL (ref 6.5–8.1)

## 2017-05-06 LAB — CBC WITH DIFFERENTIAL/PLATELET
BASOS ABS: 0.1 10*3/uL (ref 0–0.1)
BASOS PCT: 1 %
EOS ABS: 0.1 10*3/uL (ref 0–0.7)
Eosinophils Relative: 1 %
HEMATOCRIT: 35.8 % (ref 35.0–47.0)
Hemoglobin: 12 g/dL (ref 12.0–16.0)
Lymphocytes Relative: 32 %
Lymphs Abs: 2.9 10*3/uL (ref 1.0–3.6)
MCH: 31.3 pg (ref 26.0–34.0)
MCHC: 33.4 g/dL (ref 32.0–36.0)
MCV: 93.6 fL (ref 80.0–100.0)
MONO ABS: 0.6 10*3/uL (ref 0.2–0.9)
Monocytes Relative: 7 %
NEUTROS ABS: 5.3 10*3/uL (ref 1.4–6.5)
NEUTROS PCT: 59 %
PLATELETS: 219 10*3/uL (ref 150–440)
RBC: 3.83 MIL/uL (ref 3.80–5.20)
RDW: 12 % (ref 11.5–14.5)
WBC: 9 10*3/uL (ref 3.6–11.0)

## 2017-05-07 NOTE — Progress Notes (Signed)
Hematology/Oncology Consult note [facility]  [phone] [phone]  Patient Care Team: Pleas Koch, NP as PCP - General (Internal Medicine)   Name of the patient: Monica Villanueva  [id number]  Mar 06, 1946   Date of visit: 05/07/17  Diagnosis- 2 foci of invasive breast cancer in the right breast Stage I pT1c pn1(mi)sn cM0 ER/PR positive and Her2 negative   Chief complaint/ Reason for visit- routine f/u of breast cancer  Heme/Onc history: 1. Patient is a 72 year old female who underwent bilateral screening mammogram on 36 MT 18 which showed masses in bilateral breasts requiring further evaluation.  2. Diagnostic bilateral mammogram on 07/10/2016 showed:IMPRESSION:1. Findings suspicious for multicentric carcinoma in the right breast. There is a suspicious irregular mass in the upper inner quadrant at 2 o'clock position 7 cm from the nipple and there are extensive pleomorphic calcifications in the outer right breast, involving both the lower outer quadrant and upper outer quadrant. 2. Probable fibroadenoma 10:30 position upper-outer quadrant right breast. 3. Probable complicated cyst 3 o'clock retroareolar left breast.4. Probable sebaceous cyst left axilla.  3. Patient underwent core biopsy of both the right breast lesions as well as ultrasound-guided aspiration of left breast cyst. Pathology showed:  DIAGNOSIS:  A. BREAST, RIGHT, LOWER OUTER QUADRANT; STEREOTACTIC-GUIDED CORE BIOPSY:  - MICROINVASIVE LOBULAR CARCINOMA, 0.95 MM.  - DUCTAL CARCINOMA IN SITU, INTERMEDIATE NUCLEAR GRADE WITH FOCAL  NECROSIS, ASSOCIATED WITH CALCIFICATIONS.   B. BREAST, RIGHT, 2 O'CLOCK, 7 CM FROM NIPPLE; ULTRASOUND-GUIDED CORE  BIOPSY:  - INVASIVE MAMMARY CARCINOMA, NO SPECIAL TYPE.   4. At baseline patient is in good health and reports no significant comorbidities. She does not take any other medications except multivitamins. No family history of  breast cancer. Menarche at the age of 48 and menopause in 31. She did not breast-feed and used oral contraceptive pills remotely. She is G2 P2 L2.  5. Patient underwent right simple mastectomy with SLNB on 4/2/418. Pathology showed:  DIAGNOSIS:  A. SENTINEL LYMPH NODE; EXCISION:  - ONE LYMPH NODE NEGATIVE FOR MALIGNANCY (0/1).   B. BREAST; SIMPLE MASTECTOMY:  - INVASIVE MAMMARY CARCINOMA, NO SPECIAL TYPE (2:00 LOCATION).  - INVASIVE LOBULAR CARCINOMA ARISING IN AREA OF EXTENSIVE DUCTAL  CARCINOMA IN SITU WITH ADJACENT LOBULAR CARCINOMA IN SITU (6:00  LOCATION).  - MICRO METASTATIC CARCINOMA INVOLVES ONE OF THREE LYMPH NODES (1/3).  - SEE CANCER SUMMARY BELOW.  - FIBROADENOMA.  - NIPPLE WITH INTRADUCTAL PAPILLOMA.  - TWO SEPARATE BIOPSY SITES WITH METALLIC MARKERS.  - SKIN WITH KELOID.   Surgical Pathology Cancer Case Summary   INVASIVE CARCINOMA OF THE BREAST  Procedure: Simple mastectomy  Specimen Laterality: Right  Histologic Type: Invasive mammary carcinoma of no special type  invasive lobular carcinoma  Histologic Grade (Nottingham Histologic Score)       Glandular (Acinar)/Tubular Differentiation: 3       Nuclear Pleomorphism: 2       Mitotic Rate: 1       Overall Grade: 2  Tumor Size: 15 mm (invasive mammary no special type); 4 mm (invasive  lobular)  Ductal Carcinoma In Situ (DCIS): Present  Margins:    Invasive carcinoma: Negative for invasive carcinoma       Distance from closest margin: 17 mm to the deep    Ductal carcinoma in situ: Negative for invasive carcinoma       Distance from closest margin: 17 mm deep  Regional Lymph nodes:    Total # lymph nodes examined:  4    # Sentinel lymph nodes examined: 1    # Lymph nodes with macrometastasis (>2.0 mm): 0    # Lymph nodes with isolated tumor cells (<0.2 mm): 0    # Lymph nodes with micrometastasis (>0.2 mm and <2.0 mm): 1    Extranodal extension:  Notidentified  Treatment Effect: No known pre-surgical therapy  Lymphovascular Invasion: Not identified  Pathologic Stage Classification (pTNM, AJCC 8th Edition): pT1c pN65m  (sn)  TNM Descriptors: (m) multifocal    BREAST BIOMARKER TESTS - performed on prior biopsy of invasive mammary  carcinoma no special type (2:00 lesion)  Estrogen Receptor (ER) Status: POSITIVE, >90% nuclear staining  Progesterone Receptor (PgR) Status: POSITIVE, >90% nuclear staining  HER2 (by immunohistochemistry): Equivocal (Score 2+)  Percentage of cells with uniform intense complete membrane staining: 5%  HER2 (ERBB2) (by in situ hybridization): NEGATIVE   6. mammaprint came back as low risk and she did not require adjuvant chemotherapy. Started on arimidex on 09/03/16. Baseline bone density scan showed osteopenia    Interval history-.Patient is tolerating her Arimidex well and denies any complaints today.  She is also taking her calcium and vitamin D regularly.  She denies any fatigue, unintentional weight loss, aches or pains anywhere  ECOG PS- 0 Pain scale- 0   Review of systems- Review of Systems  Constitutional: Negative for chills, fever, malaise/fatigue and weight loss.  HENT: Negative for congestion, ear discharge and nosebleeds.   Eyes: Negative for blurred vision.  Respiratory: Negative for cough, hemoptysis, sputum production, shortness of breath and wheezing.   Cardiovascular: Negative for chest pain, palpitations, orthopnea and claudication.  Gastrointestinal: Negative for abdominal pain, blood in stool, constipation, diarrhea, heartburn, melena, nausea and vomiting.  Genitourinary: Negative for dysuria, flank pain, frequency, hematuria and urgency.  Musculoskeletal: Negative for back pain, joint pain and myalgias.  Skin: Negative for rash.  Neurological: Negative for dizziness, tingling, focal weakness, seizures, weakness and headaches.  Endo/Heme/Allergies: Does not bruise/bleed easily.    Psychiatric/Behavioral: Negative for depression and suicidal ideas. The patient does not have insomnia.       No Known Allergies   Past Medical History:  Diagnosis Date  . Cancer ([place]   . GERD (gastroesophageal reflux disease)    OCC  . Keloid of skin    right axilla     Past Surgical History:  Procedure Laterality Date  . ABDOMINAL HYSTERECTOMY  1996  . BREAST BIOPSY Right 07/15/2016   stereo bx of calcs LOQ-path pending  . BREAST BIOPSY Right 07/15/2016   u[place]oreabx of mass-path pending  . BREAST CYST ASPIRATION Left 07/15/2016  . COLONOSCOPY    . SENTINEL NODE BIOPSY Right 08/11/2016   Procedure: SENTINEL NODE BIOPSY;  Surgeon: JLeonie Green MD;  Location: [facility] ORS;  Service: General;  Laterality: Right;  . SIMPLE MASTECTOMY WITH AXILLARY SENTINEL NODE BIOPSY Right 08/11/2016   Procedure: SIMPLE MASTECTOMY;  Surgeon: JLeonie Green MD;  Location: [facility] ORS;  Service: General;  Laterality: Right;    Social History   Socioeconomic History  . Marital status: Widowed    Spouse name: Not on file  . Number of children: Not on file  . Years of education: Not on file  . Highest education level: Not on file  Social Needs  . Financial resource strain: Not on file  . Food insecurity - worry: Not on file  . Food insecurity - inability: Not on file  . Transportation needs - medical: Not on file  .  Transportation needs - non-medical: Not on file  Occupational History  . Not on file  Tobacco Use  . Smoking status: Never Smoker  . Smokeless tobacco: Never Used  Substance and Sexual Activity  . Alcohol use: Yes    Comment: OCC  . Drug use: No  . Sexual activity: Not on file  Other Topics Concern  . Not on file  Social History Narrative   Widowed.   2 children, 4 grandchildren.   Retired. Previously worked at [employer].   Enjoys writing, playing on the key boards.    Family History  Problem Relation Age of Onset  . Hypertension Mother    . Hypertension Sister   . Breast cancer Neg Hx      Current Outpatient Medications:  .  amLODipine (NORVASC) 10 MG tablet, Take 1 tablet (10 mg total) by mouth daily., Disp: 90 tablet, Rfl: 3 .  anastrozole (ARIMIDEX) 1 MG tablet, Take 1 tablet (1 mg total) by mouth daily., Disp: 90 tablet, Rfl: 0 .  calcium carbonate (TUMS - DOSED IN MG ELEMENTAL CALCIUM) 500 MG chewable tablet, Chew 1 tablet by mouth as needed for indigestion or heartburn., Disp: , Rfl:  .  Calcium-Magnesium-Vitamin D (CALCIUM 1200+D3 PO), Take 2 tablets by mouth daily. Calcium 1200 units / Vit D 800 mg, Disp: , Rfl:   Physical exam:  Vitals:   05/06/17 1359 05/06/17 1403  BP:  132/85  Pulse:  (!) 56  Resp:  18  Temp:  97.7 F (36.5 C)  TempSrc:  Tympanic  Weight: 222 lb (100.7 kg)    Physical Exam  Constitutional: She is oriented to person, place, and time.  Patient is obese.  Does not appear to be in any acute distress  HENT:  Head: Normocephalic and atraumatic.  Eyes: EOM are normal. Pupils are equal, round, and reactive to light.  Neck: Normal range of motion.  Cardiovascular: Normal rate, regular rhythm and normal heart sounds.  Pulmonary/Chest: Effort normal and breath sounds normal.  Abdominal: Soft. Bowel sounds are normal.  Neurological: She is alert and oriented to person, place, and time.  Skin: Skin is warm and dry.  Patient is status post right mastectomy without reconstruction.  There is no evidence of chest wall recurrence on the right side.  No palpable masses in the left breast.  No palpable bilateral axillary adenopathy  CMP Latest Ref Rng & Units 05/06/2017  Glucose 65 - 99 mg/dL 154(H)  BUN 6 - 20 mg/dL 14  Creatinine 0.44 - 1.00 mg/dL 0.73  Sodium 135 - 145 mmol/L 137  Potassium 3.5 - 5.1 mmol/L 3.8  Chloride 101 - 111 mmol/L 100(L)  CO2 22 - 32 mmol/L 29  Calcium 8.9 - 10.3 mg/dL 9.9  Total Protein 6.5 - 8.1 g/dL 8.0  Total Bilirubin 0.3 - 1.2 mg/dL 0.5  Alkaline Phos 38 - 126  U/L 84  AST 15 - 41 U/L 20  ALT 14 - 54 U/L 15   CBC Latest Ref Rng & Units 05/06/2017  WBC 3.6 - 11.0 K/uL 9.0  Hemoglobin 12.0 - 16.0 g/dL 12.0  Hematocrit 35.0 - 47.0 % 35.8  Platelets 150 - 440 K/uL 219     Assessment and plan- Patient is a 72 y.o. female invasive breast cancer in the right breast Stage I pT1c pn1(mi)sn cM0 ER/PR positive and Her2 negative status post right mastectomy and currently on Arimidex  Patient is tolerating her Arimidex well and she will continue that along with  calcium and vitamin D for at least 5 years.  We will monitor her bone density scan every other year.  I will see her back in 6 months time.  She will get routine mammograms through Dr. Tamala Julian.  She is due for her mammogram in April 2019 and will call our office if she has any concerns scheduling her mammograms with Dr. Tamala Julian   Visit Diagnosis 1. Malignant neoplasm of right breast in female, estrogen receptor positive, unspecified site of breast ([place])   2. Encounter for follow-up surveillance of breast cancer      Dr. Randa Evens, MD, MPH [facility] Pager- [phone] 05/07/2017 11:29 AM

## 2017-05-14 ENCOUNTER — Ambulatory Visit (INDEPENDENT_AMBULATORY_CARE_PROVIDER_SITE_OTHER): Payer: Medicare HMO | Admitting: Primary Care

## 2017-05-14 ENCOUNTER — Encounter: Payer: Self-pay | Admitting: Primary Care

## 2017-05-14 ENCOUNTER — Ambulatory Visit (INDEPENDENT_AMBULATORY_CARE_PROVIDER_SITE_OTHER): Payer: Medicare HMO

## 2017-05-14 VITALS — BP 122/70 | HR 61 | Temp 98.2°F | Ht 62.0 in | Wt 221.8 lb

## 2017-05-14 DIAGNOSIS — Z23 Encounter for immunization: Secondary | ICD-10-CM | POA: Diagnosis not present

## 2017-05-14 DIAGNOSIS — Z0000 Encounter for general adult medical examination without abnormal findings: Secondary | ICD-10-CM | POA: Diagnosis not present

## 2017-05-14 DIAGNOSIS — E119 Type 2 diabetes mellitus without complications: Secondary | ICD-10-CM | POA: Diagnosis not present

## 2017-05-14 DIAGNOSIS — I10 Essential (primary) hypertension: Secondary | ICD-10-CM | POA: Diagnosis not present

## 2017-05-14 LAB — LIPID PANEL
CHOL/HDL RATIO: 4
Cholesterol: 206 mg/dL — ABNORMAL HIGH (ref 0–200)
HDL: 50.4 mg/dL (ref >39.00)
LDL CALC: 143 mg/dL — AB (ref 0–99)
NonHDL: 155.58
Triglycerides: 62 mg/dL (ref 0.0–149.0)
VLDL: 12.4 mg/dL (ref 0.0–40.0)

## 2017-05-14 LAB — MICROALBUMIN / CREATININE URINE RATIO
Creatinine,U: 156 mg/dL
MICROALB/CREAT RATIO: 1.1 mg/g (ref 0.0–30.0)
Microalb, Ur: 1.7 mg/dL (ref 0.0–1.9)

## 2017-05-14 LAB — HEMOGLOBIN A1C: HEMOGLOBIN A1C: 7.8 % — AB (ref 4.6–6.5)

## 2017-05-14 NOTE — Progress Notes (Signed)
PCP notes:   Health maintenance:  Eye exam - addressed Tetanus vaccine - postponed/insurance Foot exam - completed at F/U appt Flu vaccine - administered at F/U appt PCV13 - administered at F/U appt  Abnormal screenings:   None  Patient concerns:   None  Nurse concerns:  None  Next PCP appt:   11/11/2017 @ 0715

## 2017-05-14 NOTE — Progress Notes (Signed)
Subjective:   Monica Villanueva is a 72 y.o. female who presents for Medicare Annual (Subsequent) preventive examination.  Review of Systems:  N/A Cardiac Risk Factors include: advanced age (>50men, >66 women);diabetes mellitus;obesity (BMI >30kg/m2);hypertension     Objective:     Vitals: BP 122/70   Pulse 61   Temp 98.2 F (36.8 C) (Oral)   Ht 5\' 2"  (1.575 m)   Wt 221 lb 12.8 oz (100.6 kg)   SpO2 99%   BMI 40.57 kg/m   Body mass index is 40.57 kg/m.  Advanced Directives 05/14/2017 01/07/2017 10/08/2016 08/18/2016 08/11/2016 08/07/2016 07/31/2016  Does Patient Have a Medical Advance Directive? No No No No No No No  Would patient like information on creating a medical advance directive? No - Patient declined No - Patient declined - - No - Patient declined - -    Tobacco Social History   Tobacco Use  Smoking Status Never Smoker  Smokeless Tobacco Never Used     Counseling given: No   Clinical Intake:  Pre-visit preparation completed: Yes  Pain : No/denies pain Pain Score: 0-No pain     Nutritional Status: BMI > 30  Obese Nutritional Risks: None Diabetes: Yes CBG done?: No Did pt. bring in CBG monitor from home?: No  How often do you need to have someone help you when you read instructions, pamphlets, or other written materials from your doctor or pharmacy?: 1 - Never What is the last grade level you completed in school?: 12th grade  Interpreter Needed?: No  Comments: patient and her daughter/son-in-law live together Information entered by :: LPinson, LPN  Past Medical History:  Diagnosis Date  . Cancer ([place])   . GERD (gastroesophageal reflux disease)    OCC  . Keloid of skin    right axilla   Past Surgical History:  Procedure Laterality Date  . ABDOMINAL HYSTERECTOMY  1996  . BREAST BIOPSY Right 07/15/2016   stereo bx of calcs LOQ-path pending  . BREAST BIOPSY Right 07/15/2016   [place] bx of mass-path pending  . BREAST CYST ASPIRATION Left 07/15/2016  .  COLONOSCOPY    . SENTINEL NODE BIOPSY Right 08/11/2016   Procedure: SENTINEL NODE BIOPSY;  Surgeon: Leonie Green, MD;  Location: [facility] ORS;  Service: General;  Laterality: Right;  . SIMPLE MASTECTOMY WITH AXILLARY SENTINEL NODE BIOPSY Right 08/11/2016   Procedure: SIMPLE MASTECTOMY;  Surgeon: Leonie Green, MD;  Location: [facility] ORS;  Service: General;  Laterality: Right;   Family History  Problem Relation Age of Onset  . Hypertension Mother   . Hypertension Sister   . Breast cancer Neg Hx    Social History   Socioeconomic History  . Marital status: Widowed    Spouse name: None  . Number of children: None  . Years of education: None  . Highest education level: None  Social Needs  . Financial resource strain: None  . Food insecurity - worry: None  . Food insecurity - inability: None  . Transportation needs - medical: None  . Transportation needs - non-medical: None  Occupational History  . None  Tobacco Use  . Smoking status: Never Smoker  . Smokeless tobacco: Never Used  Substance and Sexual Activity  . Alcohol use: Yes    Alcohol/week: 4.2 oz    Types: 7 Glasses of wine per week  . Drug use: No  . Sexual activity: Not Currently  Other Topics Concern  . None  Social History Narrative   Widowed.  2 children, 4 grandchildren.   Retired. Previously worked at [employer].   Enjoys writing, playing on the key boards.    Outpatient Encounter Medications as of 05/14/2017  Medication Sig  . amLODipine (NORVASC) 10 MG tablet Take 1 tablet (10 mg total) by mouth daily.  Marland Kitchen anastrozole (ARIMIDEX) 1 MG tablet Take 1 tablet (1 mg total) by mouth daily.  . calcium carbonate (TUMS - DOSED IN MG ELEMENTAL CALCIUM) 500 MG chewable tablet Chew 1 tablet by mouth as needed for indigestion or heartburn.  . Calcium-Magnesium-Vitamin D (CALCIUM 1200+D3 PO) Take 2 tablets by mouth daily. Calcium 1200 units / Vit D 800 mg   No facility-administered encounter  medications on file as of 05/14/2017.     Activities of Daily Living In your present state of health, do you have any difficulty performing the following activities: 05/14/2017 08/11/2016  Hearing? N N  Vision? N N  Difficulty concentrating or making decisions? N N  Walking or climbing stairs? Y N  Dressing or bathing? N N  Doing errands, shopping? N N  Preparing Food and eating ? N -  Using the Toilet? N -  In the past six months, have you accidently leaked urine? N -  Do you have problems with loss of bowel control? N -  Managing your Medications? N -  Managing your Finances? N -  Housekeeping or managing your Housekeeping? N -  Some recent data might be hidden    Patient Care Team: Pleas Koch, NP as PCP - General (Internal Medicine)    Assessment:   This is a routine wellness examination for [name].   Hearing Screening   125Hz  250Hz  500Hz  1000Hz  2000Hz  3000Hz  4000Hz  6000Hz  8000Hz   Right ear:   40 40 40  40    Left ear:   40 40 40  40      Visual Acuity Screening   Right eye Left eye Both eyes  Without correction:     With correction: 20/30 20/30 20/20-1     Exercise Activities and Dietary recommendations Current Exercise Habits: Home exercise routine, Type of exercise: yoga;walking;calisthenics, Time (Minutes): 60, Frequency (Times/Week): 7, Weekly Exercise (Minutes/Week): 420, Intensity: Moderate, Exercise limited by: None identified  Goals    . Increase physical activity     Starting 05/14/2017, I will continue to exercise for at least 60 minutes daily.        Fall Risk Fall Risk  05/14/2017  Falls in the past year? No     Depression Screen PHQ 2/9 Scores 05/14/2017 05/13/2016  PHQ - 2 Score 0 0  PHQ- 9 Score 0 -     Cognitive Function MMSE - Mini Mental State Exam 05/14/2017  Orientation to time 5  Orientation to Place 5  Registration 3  Attention/ Calculation 0  Recall 3  Language- name 2 objects 0  Language- repeat 1  Language- follow 3  step command 3  Language- read & follow direction 0  Write a sentence 0  Copy design 0  Total score 20     PLEASE NOTE: A Mini-Cog screen was completed. Maximum score is 20. A value of 0 denotes this part of [place] MMSE was not completed or the patient failed this part of the Mini-Cog screening.   Mini-Cog Screening Orientation to Time - Max 5 pts Orientation to Place - Max 5 pts Registration - Max 3 pts Recall - Max 3 pts Language Repeat - Max 1 pts Language Follow 3 Step  Command - Max 3 pts     Immunization History  Administered Date(s) Administered  . Influenza,inj,Quad PF,6+ Mos 05/13/2016, 05/14/2017  . Pneumococcal Conjugate-13 05/14/2017     Screening Tests Health Maintenance  Topic Date Due  . OPHTHALMOLOGY EXAM  05/14/2018 (Originally 10/25/1955)  . TETANUS/TDAP  05/14/2018 (Originally 10/24/1964)  . HEMOGLOBIN A1C  07/22/2017  . FOOT EXAM  05/14/2018  . URINE MICROALBUMIN  05/14/2018  . PNA vac Low Risk Adult (2 of 2 - PPSV23) 05/14/2018  . MAMMOGRAM  07/11/2018  . Fecal DNA (Cologuard)  05/20/2019  . INFLUENZA VACCINE  Completed  . DEXA SCAN  Completed  . Hepatitis C Screening  Completed       Plan:     I have personally reviewed, addressed, and noted the following in the patient's chart:  A. Medical and social history B. Use of alcohol, tobacco or illicit drugs  C. Current medications and supplements D. Functional ability and status E.  Nutritional status F.  Physical activity G. Advance directives H. List of other physicians I.  Hospitalizations, surgeries, and ER visits in previous 12 months J.  [place] to include hearing, vision, cognitive, depression L. Referrals and appointments - none  In addition, I have reviewed and discussed with patient certain preventive protocols, quality metrics, and best practice recommendations. A written personalized care plan for preventive services as well as general preventive health recommendations  were provided to patient.  See attached scanned questionnaire for additional information.   Signed,   Lindell Noe, MHA, BS, LPN Health Coach

## 2017-05-14 NOTE — Patient Instructions (Signed)
Monica Villanueva , Thank you for taking time to come for your Medicare Wellness Visit. I appreciate your ongoing commitment to your health goals. Please review the following plan we discussed and let me know if I can assist you in the future.   These are the goals we discussed: Goals    . Increase physical activity     Starting 05/14/2017, I will continue to exercise for at least 60 minutes daily.        This is a list of the screening recommended for you and due dates:  Health Maintenance  Topic Date Due  . Eye exam for diabetics  05/14/2018*  . Tetanus Vaccine  05/14/2018*  . Hemoglobin A1C  07/22/2017  . Complete foot exam   05/14/2018  . Urine Protein Check  05/14/2018  . Pneumonia vaccines (2 of 2 - PPSV23) 05/14/2018  . Mammogram  07/11/2018  . Cologuard (Stool DNA test)  05/20/2019  . Flu Shot  Completed  . DEXA scan (bone density measurement)  Completed  .  Hepatitis C: One time screening is recommended by Center for Disease Control  (CDC) for  adults born from [birth year] through 1965.   Completed  *Topic was postponed. The date shown is not the original due date.   Preventive Care for Adults  A healthy lifestyle and preventive care can promote health and wellness. Preventive health guidelines for adults include the following key practices.  . A routine yearly physical is a good way to check with your health care provider about your health and preventive screening. It is a chance to share any concerns and updates on your health and to receive a thorough exam.  . Visit your dentist for a routine exam and preventive care every 6 months. Brush your teeth twice a day and floss once a day. Good oral hygiene prevents tooth decay and gum disease.  . The frequency of eye exams is based on your age, health, family medical history, use  of contact lenses, and other factors. Follow your health care provider's recommendations for frequency of eye exams.  . Eat a healthy diet. Foods like  vegetables, fruits, whole grains, low-fat dairy products, and lean protein foods contain the nutrients you need without too many calories. Decrease your intake of foods high in solid fats, added sugars, and salt. Eat the right amount of calories for you. Get information about a proper diet from your health care provider, if necessary.  . Regular physical exercise is one of the most important things you can do for your health. Most adults should get at least 150 minutes of moderate-intensity exercise (any activity that increases your heart rate and causes you to sweat) each week. In addition, most adults need muscle-strengthening exercises on 2 or more days a week.  Silver Sneakers may be a benefit available to you. To determine eligibility, you may visit the website: www.silversneakers.com or contact program at (581)270-9788 Mon-Fri between 8AM-8PM.   . Maintain a healthy weight. The body mass index (BMI) is a screening tool to identify possible weight problems. It provides an estimate of body fat based on height and weight. Your health care provider can find your BMI and can help you achieve or maintain a healthy weight.   For adults 20 years and older: ? A BMI below 18.5 is considered underweight. ? A BMI of 18.5 to 24.9 is normal. ? A BMI of 25 to 29.9 is considered overweight. ? A BMI of 30 and above is considered obese.   Marland Kitchen  Maintain normal blood lipids and cholesterol levels by exercising and minimizing your intake of saturated fat. Eat a balanced diet with plenty of fruit and vegetables. Blood tests for lipids and cholesterol should begin at age 10 and be repeated every 5 years. If your lipid or cholesterol levels are high, you are over 50, or you are at high risk for heart disease, you may need your cholesterol levels checked more frequently. Ongoing high lipid and cholesterol levels should be treated with medicines if diet and exercise are not working.  . If you smoke, find out from your  health care provider how to quit. If you do not use tobacco, please do not start.  . If you choose to drink alcohol, please do not consume more than 2 drinks per day. One drink is considered to be 12 ounces (355 mL) of beer, 5 ounces (148 mL) of wine, or 1.5 ounces (44 mL) of liquor.  . If you are 62-62 years old, ask your health care provider if you should take aspirin to prevent strokes.  . Use sunscreen. Apply sunscreen liberally and repeatedly throughout the day. You should seek shade when your shadow is shorter than you. Protect yourself by wearing long sleeves, pants, a wide-brimmed hat, and sunglasses year round, whenever you are outdoors.  . Once a month, do a whole body skin exam, using a mirror to look at the skin on your back. Tell your health care provider of new moles, moles that have irregular borders, moles that are larger than a pencil eraser, or moles that have changed in shape or color.

## 2017-05-14 NOTE — Assessment & Plan Note (Signed)
Stable in the office today, recent CMP reviewed from oncology visit. Continue Amlodipine 10 mg.

## 2017-05-14 NOTE — Progress Notes (Signed)
Pre visit review using our clinic review tool, if applicable. No additional management support is needed unless otherwise documented below in the visit note. 

## 2017-05-14 NOTE — Progress Notes (Signed)
I reviewed health advisor's note, was available for consultation, and agree with documentation and plan.  

## 2017-05-14 NOTE — Assessment & Plan Note (Signed)
Repeat A1C and urine microalbumin due today. Prevnar vaccination completed today. Foot exam completed today. Recommended annual eye exam for which she'll schedule.  BP stable, continue to monitor.  Follow up in 6 months or sooner based off lab results.

## 2017-05-14 NOTE — Patient Instructions (Addendum)
Stop by the lab prior to leaving today. I will notify you of your results once received.   Continue to work on a healthy diet.   Ensure you are consuming 64 ounces of water daily.  Start exercising. You should be getting 150 minutes of exercise weekly.  Schedule an eye exam as discussed.  You were provided with a pneumonia and influenza vaccination today.  Schedule a follow up visit in 6 months.  It was a pleasure to see you today!   Diabetes Mellitus and Nutrition When you have diabetes (diabetes mellitus), it is very important to have healthy eating habits because your blood sugar (glucose) levels are greatly affected by what you eat and drink. Eating healthy foods in the appropriate amounts, at about the same times every day, can help you:  Control your blood glucose.  Lower your risk of heart disease.  Improve your blood pressure.  Reach or maintain a healthy weight.  Every person with diabetes is different, and each person has different needs for a meal plan. Your health care provider may recommend that you work with a diet and nutrition specialist (dietitian) to make a meal plan that is best for you. Your meal plan may vary depending on factors such as:  The calories you need.  The medicines you take.  Your weight.  Your blood glucose, blood pressure, and cholesterol levels.  Your activity level.  Other health conditions you have, such as heart or kidney disease.  How do carbohydrates affect me? Carbohydrates affect your blood glucose level more than any other type of food. Eating carbohydrates naturally increases the amount of glucose in your blood. Carbohydrate counting is a method for keeping track of how many carbohydrates you eat. Counting carbohydrates is important to keep your blood glucose at a healthy level, especially if you use insulin or take certain oral diabetes medicines. It is important to know how many carbohydrates you can safely have in each meal.  This is different for every person. Your dietitian can help you calculate how many carbohydrates you should have at each meal and for snack. Foods that contain carbohydrates include:  Bread, cereal, rice, pasta, and crackers.  Potatoes and corn.  Peas, beans, and lentils.  Milk and yogurt.  Fruit and juice.  Desserts, such as cakes, cookies, ice cream, and candy.  How does alcohol affect me? Alcohol can cause a sudden decrease in blood glucose (hypoglycemia), especially if you use insulin or take certain oral diabetes medicines. Hypoglycemia can be a life-threatening condition. Symptoms of hypoglycemia (sleepiness, dizziness, and confusion) are similar to symptoms of having too much alcohol. If your health care provider says that alcohol is safe for you, follow these guidelines:  Limit alcohol intake to no more than 1 drink per day for nonpregnant women and 2 drinks per day for men. One drink equals 12 oz of beer, 5 oz of wine, or 1 oz of hard liquor.  Do not drink on an empty stomach.  Keep yourself hydrated with water, diet soda, or unsweetened iced tea.  Keep in mind that regular soda, juice, and other mixers may contain a lot of sugar and must be counted as carbohydrates.  What are tips for following this plan? Reading food labels  Start by checking the serving size on the label. The amount of calories, carbohydrates, fats, and other nutrients listed on the label are based on one serving of the food. Many foods contain more than one serving per package.  Check the  total grams (g) of carbohydrates in one serving. You can calculate the number of servings of carbohydrates in one serving by dividing the total carbohydrates by 15. For example, if a food has 30 g of total carbohydrates, it would be equal to 2 servings of carbohydrates.  Check the number of grams (g) of saturated and trans fats in one serving. Choose foods that have low or no amount of these fats.  Check the number  of milligrams (mg) of sodium in one serving. Most people should limit total sodium intake to less than 2,300 mg per day.  Always check the nutrition information of foods labeled as "low-fat" or "nonfat". These foods may be higher in added sugar or refined carbohydrates and should be avoided.  Talk to your dietitian to identify your daily goals for nutrients listed on the label. Shopping  Avoid buying canned, premade, or processed foods. These foods tend to be high in fat, sodium, and added sugar.  Shop around the outside edge of the grocery store. This includes fresh fruits and vegetables, bulk grains, fresh meats, and fresh dairy. Cooking  Use low-heat cooking methods, such as baking, instead of high-heat cooking methods like deep frying.  Cook using healthy oils, such as olive, canola, or sunflower oil.  Avoid cooking with butter, cream, or high-fat meats. Meal planning  Eat meals and snacks regularly, preferably at the same times every day. Avoid going long periods of time without eating.  Eat foods high in fiber, such as fresh fruits, vegetables, beans, and whole grains. Talk to your dietitian about how many servings of carbohydrates you can eat at each meal.  Eat 4-6 ounces of lean protein each day, such as lean meat, chicken, fish, eggs, or tofu. 1 ounce is equal to 1 ounce of meat, chicken, or fish, 1 egg, or 1/4 cup of tofu.  Eat some foods each day that contain healthy fats, such as avocado, nuts, seeds, and fish. Lifestyle   Check your blood glucose regularly.  Exercise at least 30 minutes 5 or more days each week, or as told by your health care provider.  Take medicines as told by your health care provider.  Do not use any products that contain nicotine or tobacco, such as cigarettes and e-cigarettes. If you need help quitting, ask your health care provider.  Work with a Social worker or diabetes educator to identify strategies to manage stress and any emotional and social  challenges. What are some questions to ask my health care provider?  Do I need to meet with a diabetes educator?  Do I need to meet with a dietitian?  What number can I call if I have questions?  When are the best times to check my blood glucose? Where to find more information:  American Diabetes Association: diabetes.org/food-and-fitness/food  Academy of Nutrition and Dietetics: PokerClues.dk  [employer] of Diabetes and Digestive and Kidney Diseases (NIH): ContactWire.be Summary  A healthy meal plan will help you control your blood glucose and maintain a healthy lifestyle.  Working with a diet and nutrition specialist (dietitian) can help you make a meal plan that is best for you.  Keep in mind that carbohydrates and alcohol have immediate effects on your blood glucose levels. It is important to count carbohydrates and to use alcohol carefully. This information is not intended to replace advice given to you by your health care provider. Make sure you discuss any questions you have with your health care provider. Document Released: 01/01/2005 Document Revised: 05/11/2016 Document  Reviewed: 05/11/2016 Elsevier Interactive Patient Education  [employer].

## 2017-05-14 NOTE — Addendum Note (Signed)
Addended by: Jacqualin Combes on: 05/14/2017 12:30 PM   Modules accepted: Orders

## 2017-05-14 NOTE — Progress Notes (Signed)
Subjective:    Patient ID: Monica Villanueva, female    DOB: [birth year], 72 y.o.   MRN: [id number]  HPI  Ms. Monica Villanueva is a 72 year old female who presents today for follow up.  1) Type 2 Diabetes:  Current medications include: None   Last A1C: 7.5 in October 2018 Last Eye Exam: No recent exam. Last Foot Exam: Due today. Pneumonia Vaccination: Never completed. Due today. ACE/ARB: Urine microalbumin due today. No ACE/ARB Statin: Lipids pending today. No current statin.  Diet currently consists of:  Breakfast: Boiled egg, wheat toast with peanut butter Lunch: Salad, grilled chicken, [place]  Dinner: Best boy, cabbage, greens Snacks: Fruit Desserts: Occasionally  Beverages: Coffee, water  Exercise: She is not exercising.  BP Readings from Last 3 Encounters:  05/14/17 122/70  05/06/17 132/85  02/12/17 126/82     She denies numbness/tingling, weakness, dizziness.     Review of Systems  Constitutional: Negative for fatigue.  Respiratory: Negative for shortness of breath.   Cardiovascular: Negative for chest pain.  Neurological: Negative for numbness.       Past Medical History:  Diagnosis Date  . Cancer ([place])   . GERD (gastroesophageal reflux disease)    OCC  . Keloid of skin    right axilla     Social History   Socioeconomic History  . Marital status: Widowed    Spouse name: Not on file  . Number of children: Not on file  . Years of education: Not on file  . Highest education level: Not on file  Social Needs  . Financial resource strain: Not on file  . Food insecurity - worry: Not on file  . Food insecurity - inability: Not on file  . Transportation needs - medical: Not on file  . Transportation needs - non-medical: Not on file  Occupational History  . Not on file  Tobacco Use  . Smoking status: Never Smoker  . Smokeless tobacco: Never Used  Substance and Sexual Activity  . Alcohol use: Yes    Comment: OCC  . Drug use: No  . Sexual activity:  Not on file  Other Topics Concern  . Not on file  Social History Narrative   Widowed.   2 children, 4 grandchildren.   Retired. Previously worked at [employer].   Enjoys writing, playing on the key boards.    Past Surgical History:  Procedure Laterality Date  . ABDOMINAL HYSTERECTOMY  1996  . BREAST BIOPSY Right 07/15/2016   stereo bx of calcs LOQ-path pending  . BREAST BIOPSY Right 07/15/2016   [place] bx of mass-path pending  . BREAST CYST ASPIRATION Left 07/15/2016  . COLONOSCOPY    . SENTINEL NODE BIOPSY Right 08/11/2016   Procedure: SENTINEL NODE BIOPSY;  Surgeon: Leonie Green, MD;  Location: [facility] ORS;  Service: General;  Laterality: Right;  . SIMPLE MASTECTOMY WITH AXILLARY SENTINEL NODE BIOPSY Right 08/11/2016   Procedure: SIMPLE MASTECTOMY;  Surgeon: Leonie Green, MD;  Location: [facility] ORS;  Service: General;  Laterality: Right;    Family History  Problem Relation Age of Onset  . Hypertension Mother   . Hypertension Sister   . Breast cancer Neg Hx     No Known Allergies  Current Outpatient Medications on File Prior to Visit  Medication Sig Dispense Refill  . amLODipine (NORVASC) 10 MG tablet Take 1 tablet (10 mg total) by mouth daily. 90 tablet 3  . anastrozole (ARIMIDEX) 1 MG tablet Take 1 tablet (1 mg  total) by mouth daily. 90 tablet 0  . calcium carbonate (TUMS - DOSED IN MG ELEMENTAL CALCIUM) 500 MG chewable tablet Chew 1 tablet by mouth as needed for indigestion or heartburn.    . Calcium-Magnesium-Vitamin D (CALCIUM 1200+D3 PO) Take 2 tablets by mouth daily. Calcium 1200 units / Vit D 800 mg     No current facility-administered medications on file prior to visit.     BP 122/70   Pulse 61   Temp 98.2 F (36.8 C) (Oral)   Ht 5\' 2"  (1.575 m)   Wt 221 lb 12.8 oz (100.6 kg)   SpO2 99%   BMI 40.57 kg/m    Objective:   Physical Exam  Constitutional: She appears well-nourished.  Neck: Neck supple.  Cardiovascular: Normal rate and  regular rhythm.  Pulmonary/Chest: Effort normal and breath sounds normal.  Skin: Skin is warm and dry.  Psychiatric: She has a normal mood and affect.          Assessment & Plan:

## 2017-06-14 ENCOUNTER — Other Ambulatory Visit: Payer: Self-pay | Admitting: *Deleted

## 2017-06-14 MED ORDER — ANASTROZOLE 1 MG PO TABS: 1.0000 mg | ORAL_TABLET | Freq: Every day | ORAL | 0 refills | Status: DC

## 2017-06-21 ENCOUNTER — Other Ambulatory Visit: Payer: Self-pay | Admitting: Surgery

## 2017-06-21 ENCOUNTER — Encounter: Payer: Self-pay | Admitting: *Deleted

## 2017-06-21 DIAGNOSIS — Z853 Personal history of malignant neoplasm of breast: Secondary | ICD-10-CM

## 2017-06-21 NOTE — Progress Notes (Signed)
  Oncology Nurse Navigator Documentation  Navigator Location: [facility] (06/21/17 1400)   )Navigator Encounter Type: Telephone (06/21/17 1400) Telephone: Incoming Call (06/21/17 1400)                       Barriers/Navigation Needs: Coordination of Care (06/21/17 1400)                          Time Spent with Patient: 15 (06/21/17 1400)   Patient called and needs follow up care with Dr. Tamala Julian who is retiring.  Informed patient that Dr. Peyton Najjar is seeing Dr. Thompson Caul patients.  Tried to transfer call to his office, but the line was busy.  Number given to patient to schedule an appointment.

## 2017-07-12 ENCOUNTER — Ambulatory Visit
Admission: RE | Admit: 2017-07-12 | Discharge: 2017-07-12 | Disposition: A | Discharge status: Home / Self Care | Payer: Medicare HMO | Source: Ambulatory Visit | Attending: Surgery | Admitting: Surgery

## 2017-07-12 ENCOUNTER — Other Ambulatory Visit: Payer: Self-pay | Admitting: Surgery

## 2017-07-12 DIAGNOSIS — Z853 Personal history of malignant neoplasm of breast: Secondary | ICD-10-CM

## 2017-07-12 DIAGNOSIS — Z1231 Encounter for screening mammogram for malignant neoplasm of breast: Secondary | ICD-10-CM

## 2017-07-20 DIAGNOSIS — Z853 Personal history of malignant neoplasm of breast: Secondary | ICD-10-CM | POA: Diagnosis not present

## 2017-09-05 ENCOUNTER — Other Ambulatory Visit: Payer: Self-pay | Admitting: Oncology

## 2017-11-04 ENCOUNTER — Inpatient Hospital Stay: Payer: Medicare HMO | Attending: Oncology

## 2017-11-04 ENCOUNTER — Other Ambulatory Visit: Payer: Self-pay | Admitting: *Deleted

## 2017-11-04 ENCOUNTER — Other Ambulatory Visit: Payer: Self-pay

## 2017-11-04 ENCOUNTER — Inpatient Hospital Stay (HOSPITAL_BASED_OUTPATIENT_CLINIC_OR_DEPARTMENT_OTHER): Payer: Medicare HMO | Admitting: Oncology

## 2017-11-04 ENCOUNTER — Encounter: Payer: Self-pay | Admitting: Oncology

## 2017-11-04 VITALS — BP 124/76 | HR 61 | Temp 97.5°F | Resp 18 | Ht 62.0 in | Wt 222.9 lb

## 2017-11-04 DIAGNOSIS — Z853 Personal history of malignant neoplasm of breast: Principal | ICD-10-CM

## 2017-11-04 DIAGNOSIS — Z170 Estrogen receptor positive status [ER+]: Secondary | ICD-10-CM | POA: Diagnosis not present

## 2017-11-04 DIAGNOSIS — C50211 Malignant neoplasm of upper-inner quadrant of right female breast: Secondary | ICD-10-CM

## 2017-11-04 DIAGNOSIS — Z5181 Encounter for therapeutic drug level monitoring: Secondary | ICD-10-CM

## 2017-11-04 DIAGNOSIS — Z79811 Long term (current) use of aromatase inhibitors: Secondary | ICD-10-CM | POA: Diagnosis not present

## 2017-11-04 DIAGNOSIS — C50911 Malignant neoplasm of unspecified site of right female breast: Secondary | ICD-10-CM

## 2017-11-04 DIAGNOSIS — Z08 Encounter for follow-up examination after completed treatment for malignant neoplasm: Secondary | ICD-10-CM

## 2017-11-04 LAB — CBC WITH DIFFERENTIAL/PLATELET
Basophils Absolute: 0 10*3/uL (ref 0–0.1)
Basophils Relative: 0 %
EOS PCT: 1 %
Eosinophils Absolute: 0.1 10*3/uL (ref 0–0.7)
HEMATOCRIT: 35.1 % (ref 35.0–47.0)
Hemoglobin: 11.8 g/dL — ABNORMAL LOW (ref 12.0–16.0)
LYMPHS PCT: 32 %
Lymphs Abs: 2.8 10*3/uL (ref 1.0–3.6)
MCH: 31.7 pg (ref 26.0–34.0)
MCHC: 33.6 g/dL (ref 32.0–36.0)
MCV: 94.4 fL (ref 80.0–100.0)
Monocytes Absolute: 0.6 10*3/uL (ref 0.2–0.9)
Monocytes Relative: 7 %
NEUTROS ABS: 5.2 10*3/uL (ref 1.4–6.5)
Neutrophils Relative %: 60 %
PLATELETS: 211 10*3/uL (ref 150–440)
RBC: 3.72 MIL/uL — AB (ref 3.80–5.20)
RDW: 12.6 % (ref 11.5–14.5)
WBC: 8.8 10*3/uL (ref 3.6–11.0)

## 2017-11-04 LAB — COMPREHENSIVE METABOLIC PANEL
ALBUMIN: 3.8 g/dL (ref 3.5–5.0)
ALT: 16 U/L (ref 0–44)
ANION GAP: 8 (ref 5–15)
AST: 19 U/L (ref 15–41)
Alkaline Phosphatase: 91 U/L (ref 38–126)
BUN: 14 mg/dL (ref 8–23)
CHLORIDE: 105 mmol/L (ref 98–111)
CO2: 25 mmol/L (ref 22–32)
Calcium: 9.5 mg/dL (ref 8.9–10.3)
Creatinine, Ser: 0.76 mg/dL (ref 0.44–1.00)
GFR calc Af Amer: >60 mL/min (ref >60)
GLUCOSE: 244 mg/dL — AB (ref 70–99)
POTASSIUM: 4 mmol/L (ref 3.5–5.1)
Sodium: 138 mmol/L (ref 135–145)
Total Bilirubin: 0.4 mg/dL (ref 0.3–1.2)
Total Protein: 7.7 g/dL (ref 6.5–8.1)

## 2017-11-04 MED ORDER — ANASTROZOLE 1 MG PO TABS: 1.0000 mg | ORAL_TABLET | Freq: Every day | ORAL | 3 refills | Status: DC

## 2017-11-04 NOTE — Progress Notes (Signed)
Hematology/Oncology Consult note [facility]  [phone] [phone]  Patient Care Team: Pleas Koch, NP as PCP - General (Internal Medicine)   Name of the patient: Monica Villanueva  [id number]  26-Mar-1946   Date of visit: 11/04/17  Diagnosis- 2 foci of invasive breast cancer in the right breast Stage I pT1c pn1(mi)sn cM0 ER/PR positive and Her2 negative   Chief complaint/ Reason for visit- routine f/u of breast cancer  Heme/Onc history: 1.Patient is a 72 year old female who underwent bilateral screening mammogram which showed masses in bilateral breasts requiring further evaluation.  2. Diagnostic bilateral mammogram on 07/10/2016 showed:IMPRESSION:1. Findings suspicious for multicentric carcinoma in the right breast. There is a suspicious irregular mass in the upper inner quadrant at 2 o'clock position 7 cm from the nipple and there are extensive pleomorphic calcifications in the outer right breast, involving both the lower outer quadrant and upper outer quadrant. 2. Probable fibroadenoma 10:30 position upper-outer quadrant right breast. 3. Probable complicated cyst 3 o'clock retroareolar left breast.4. Probable sebaceous cyst left axilla.  3. Patient underwent core biopsy of both the right breast lesions as well as ultrasound-guided aspiration of left breast cyst. Pathology showed invasive lobular carcinoma and invasive mammary carcinoma in 2 separate locations ER PR positive Her 2 neu negative   4. At baseline patient is in good health and reports no significant comorbidities. She does not take any other medications except multivitamins. No family history of breast cancer. Menarche at the age of 48 and menopause in 30. She did not breast-feed and used oral contraceptive pills remotely. She is G2 P2 L2.  5. Patient underwent right simple mastectomy with SLNB on 4/2/418. Pathology showed:  DIAGNOSIS:  A. SENTINEL LYMPH NODE;  EXCISION:  - ONE LYMPH NODE NEGATIVE FOR MALIGNANCY (0/1).   B. BREAST; SIMPLE MASTECTOMY:  - INVASIVE MAMMARY CARCINOMA, NO SPECIAL TYPE (2:00 LOCATION).  - INVASIVE LOBULAR CARCINOMA ARISING IN AREA OF EXTENSIVE DUCTAL  CARCINOMA IN SITU WITH ADJACENT LOBULAR CARCINOMA IN SITU (6:00  LOCATION).  - MICRO METASTATIC CARCINOMA INVOLVES ONE OF THREE LYMPH NODES (1/3).  - SEE CANCER SUMMARY BELOW.  - FIBROADENOMA.  - NIPPLE WITH INTRADUCTAL PAPILLOMA.  - TWO SEPARATE BIOPSY SITES WITH METALLIC MARKERS.  - SKIN WITH KELOID.   Surgical Pathology Cancer Case Summary   INVASIVE CARCINOMA OF THE BREAST  Procedure: Simple mastectomy  Specimen Laterality: Right  Histologic Type: Invasive mammary carcinoma of no special type  invasive lobular carcinoma  Histologic Grade (Nottingham Histologic Score)       Glandular (Acinar)/Tubular Differentiation: 3       Nuclear Pleomorphism: 2       Mitotic Rate: 1       Overall Grade: 2  Tumor Size: 15 mm (invasive mammary no special type); 4 mm (invasive  lobular)  Ductal Carcinoma In Situ (DCIS): Present  Margins:    Invasive carcinoma: Negative for invasive carcinoma       Distance from closest margin: 17 mm to the deep    Ductal carcinoma in situ: Negative for invasive carcinoma       Distance from closest margin: 17 mm deep  Regional Lymph nodes:    Total # lymph nodes examined: 4    # Sentinel lymph nodes examined: 1    # Lymph nodes with macrometastasis (>2.0 mm): 0    # Lymph nodes with isolated tumor cells (<0.2 mm): 0    # Lymph nodes with micrometastasis (>0.2 mm and <  2.0 mm): 1    Extranodal extension: Notidentified  Treatment Effect: No known pre-surgical therapy  Lymphovascular Invasion: Not identified  Pathologic Stage Classification (pTNM, AJCC 8th Edition): pT1c pN26m  (sn)  TNM Descriptors: (m) multifocal    BREAST BIOMARKER TESTS - performed on prior biopsy of  invasive mammary  carcinoma no special type (2:00 lesion)  Estrogen Receptor (ER) Status: POSITIVE, >90% nuclear staining  Progesterone Receptor (PgR) Status: POSITIVE, >90% nuclear staining  HER2 (by immunohistochemistry): Equivocal (Score 2+)  Percentage of cells with uniform intense complete membrane staining: 5%  HER2 (ERBB2) (by in situ hybridization): NEGATIVE   6. mammaprint came back as low risk and she did not require adjuvant chemotherapy. Started on arimidex on 09/03/16. Baseline bone density scan showed osteopenia   Interval history- she is tolerating arimidex well without significant arthralgias, hot flashes or fatigue. Denies any complaints today  ECOG PS- 1 Pain scale- 0 Opioid associated constipation- no  Review of systems- Review of Systems  Constitutional: Negative for chills, fever, malaise/fatigue and weight loss.  HENT: Negative for congestion, ear discharge and nosebleeds.   Eyes: Negative for blurred vision.  Respiratory: Negative for cough, hemoptysis, sputum production, shortness of breath and wheezing.   Cardiovascular: Negative for chest pain, palpitations, orthopnea and claudication.  Gastrointestinal: Negative for abdominal pain, blood in stool, constipation, diarrhea, heartburn, melena, nausea and vomiting.  Genitourinary: Negative for dysuria, flank pain, frequency, hematuria and urgency.  Musculoskeletal: Negative for back pain, joint pain and myalgias.  Skin: Negative for rash.  Neurological: Negative for dizziness, tingling, focal weakness, seizures, weakness and headaches.  Endo/Heme/Allergies: Does not bruise/bleed easily.  Psychiatric/Behavioral: Negative for depression and suicidal ideas. The patient does not have insomnia.       No Known Allergies   Past Medical History:  Diagnosis Date  . [place]   . GERD (gastroesophageal reflux disease)    OCC  . Keloid of skin    right axilla     Past Surgical History:  Procedure  Laterality Date  . ABDOMINAL HYSTERECTOMY  1996  . BREAST BIOPSY Right 07/15/2016   stereo bx of calcs LOQ-path pending  . BREAST BIOPSY Right 07/15/2016   u[place]x of mass-path pending  . BREAST CYST ASPIRATION Left 07/15/2016  . COLONOSCOPY    . SENTINEL NODE BIOPSY Right 08/11/2016   Procedure: SENTINEL NODE BIOPSY;  Surgeon: JLeonie Green MD;  Location: [facility] ORS;  Service: General;  Laterality: Right;  . SIMPLE MASTECTOMY WITH AXILLARY SENTINEL NODE BIOPSY Right 08/11/2016   Procedure: SIMPLE MASTECTOMY;  Surgeon: JLeonie Green MD;  Location: [facility] ORS;  Service: General;  Laterality: Right;    Social History   Socioeconomic History  . Marital status: Widowed    Spouse name: Not on file  . Number of children: Not on file  . Years of education: Not on file  . Highest education level: Not on file  Occupational History  . Not on file  Social Needs  . Financial resource strain: Not on file  . Food insecurity:    Worry: Not on file    Inability: Not on file  . Transportation needs:    Medical: Not on file    Non-medical: Not on file  Tobacco Use  . Smoking status: Never Smoker  . Smokeless tobacco: Never Used  Substance and Sexual Activity  . Alcohol use: Yes    Alcohol/week: 4.2 oz    Types: 7 Glasses of wine per week  . Drug use: No  .  Sexual activity: Not Currently  Lifestyle  . Physical activity:    Days per week: Not on file    Minutes per session: Not on file  . Stress: Not on file  Relationships  . Social connections:    Talks on phone: Not on file    Gets together: Not on file    Attends religious service: Not on file    Active member of club or organization: Not on file    Attends meetings of clubs or organizations: Not on file    Relationship status: Not on file  . Intimate partner violence:    Fear of current or ex partner: Not on file    Emotionally abused: Not on file    Physically abused: Not on file    Forced sexual activity: Not on file   Other Topics Concern  . Not on file  Social History Narrative   Widowed.   2 children, 4 grandchildren.   Retired. Previously worked at [employer].   Enjoys writing, playing on the key boards.    Family History  Problem Relation Age of Onset  . Hypertension Mother   . Hypertension Sister   . Breast cancer Neg Hx      Current Outpatient Medications:  .  amLODipine (NORVASC) 10 MG tablet, Take 1 tablet (10 mg total) by mouth daily., Disp: 90 tablet, Rfl: 3 .  anastrozole (ARIMIDEX) 1 MG tablet, TAKE 1 TABLET BY MOUTH EVERY DAY, Disp: 90 tablet, Rfl: 0 .  calcium carbonate (TUMS - DOSED IN MG ELEMENTAL CALCIUM) 500 MG chewable tablet, Chew 1 tablet by mouth as needed for indigestion or heartburn., Disp: , Rfl:  .  Calcium-Magnesium-Vitamin D (CALCIUM 1200+D3 PO), Take 2 tablets by mouth daily. Calcium 1200 units / Vit D 800 mg, Disp: , Rfl:   Physical exam:  Vitals:   11/04/17 1409  BP: 124/76  Pulse: 61  Resp: 18  Temp: (!) 97.5 F (36.4 C)  TempSrc: Tympanic  Weight: 222 lb 14.4 oz (101.1 kg)  Height: 5' 2"  (1.575 m)   Physical Exam  Constitutional: She is oriented to person, place, and time.  HENT:  Head: Normocephalic and atraumatic.  Eyes: Pupils are equal, round, and reactive to light. EOM are normal.  Neck: Normal range of motion.  Cardiovascular: Normal rate, regular rhythm and normal heart sounds.  Pulmonary/Chest: Effort normal and breath sounds normal.  Abdominal: Soft. Bowel sounds are normal.  Neurological: She is alert and oriented to person, place, and time.  Skin: Skin is warm and dry.   Breast exam is performed in seated and lying down position. Patient is status post right mastectomy without reconstruction. The implant edges are intact and there is no evidence of any chest wall recurrence. No evidence of bilateral axillary adenopathy. No palpable masses in the left breast   CMP Latest Ref Rng & Units 05/06/2017  Glucose 65 - 99 mg/dL  154(H)  BUN 6 - 20 mg/dL 14  Creatinine 0.44 - 1.00 mg/dL 0.73  Sodium 135 - 145 mmol/L 137  Potassium 3.5 - 5.1 mmol/L 3.8  Chloride 101 - 111 mmol/L 100(L)  CO2 22 - 32 mmol/L 29  Calcium 8.9 - 10.3 mg/dL 9.9  Total Protein 6.5 - 8.1 g/dL 8.0  Total Bilirubin 0.3 - 1.2 mg/dL 0.5  Alkaline Phos 38 - 126 U/L 84  AST 15 - 41 U/L 20  ALT 14 - 54 U/L 15   CBC Latest Ref Rng & Units 05/06/2017  WBC 3.6 - 11.0 K/uL 9.0  Hemoglobin 12.0 - 16.0 g/dL 12.0  Hematocrit 35.0 - 47.0 % 35.8  Platelets 150 - 440 K/uL 219      Assessment and plan- Patient is a 72 y.o. female invasive breast cancer in the right breast Stage I pT1c pn1(mi)sn cM0 ER/PR positive and Her2 negativestatus post right mastectomy and currently on Arimidex. She is here for routine f/u of breast cancer  Patient is tolerating arimidex well and will continue that for 5 years with calcium and vit D. Left breast mammogram from march 2019 was normal. I will see ehr back in 6 months. No labs     Visit Diagnosis 1. Encounter for follow-up surveillance of breast cancer   2. Visit for monitoring Arimidex therapy      Dr. Randa Evens, MD, MPH [facility] [phone] 11/05/2017 8:04 AM

## 2017-11-04 NOTE — Progress Notes (Signed)
Pt has no complaints

## 2017-11-11 ENCOUNTER — Ambulatory Visit (INDEPENDENT_AMBULATORY_CARE_PROVIDER_SITE_OTHER): Payer: Medicare HMO | Admitting: Primary Care

## 2017-11-11 ENCOUNTER — Ambulatory Visit: Payer: Medicare HMO | Admitting: Primary Care

## 2017-11-11 ENCOUNTER — Encounter: Payer: Self-pay | Admitting: Primary Care

## 2017-11-11 VITALS — BP 118/68 | HR 62 | Temp 98.1°F | Wt 220.2 lb

## 2017-11-11 DIAGNOSIS — E785 Hyperlipidemia, unspecified: Secondary | ICD-10-CM

## 2017-11-11 DIAGNOSIS — I10 Essential (primary) hypertension: Secondary | ICD-10-CM | POA: Diagnosis not present

## 2017-11-11 DIAGNOSIS — E119 Type 2 diabetes mellitus without complications: Secondary | ICD-10-CM | POA: Diagnosis not present

## 2017-11-11 LAB — LIPID PANEL
CHOL/HDL RATIO: 4
CHOLESTEROL: 198 mg/dL (ref 0–200)
HDL: 47.3 mg/dL (ref >39.00)
LDL CALC: 138 mg/dL — AB (ref 0–99)
NonHDL: 150.87
TRIGLYCERIDES: 62 mg/dL (ref 0.0–149.0)
VLDL: 12.4 mg/dL (ref 0.0–40.0)

## 2017-11-11 LAB — HEMOGLOBIN A1C: Hgb A1c MFr Bld: 7.2 % — ABNORMAL HIGH (ref 4.6–6.5)

## 2017-11-11 MED ORDER — BLOOD GLUCOSE MONITOR KIT: PACK | 0 refills | Status: DC

## 2017-11-11 NOTE — Assessment & Plan Note (Signed)
Stable in the office today. Continue Amlodipine 10 mg.

## 2017-11-11 NOTE — Patient Instructions (Addendum)
Take the prescription for the glucometer kit to the pharmacy. Test 1-2 times daily: before breakfast, 2 hours after a meal  Stop by the lab prior to leaving today. I will notify you of your results once received.   It is important that you improve your diet. Please limit carbohydrates in the form of white bread, rice, pasta, sweets, fast food, fried food, sugary drinks, etc. Increase your consumption of fresh fruits and vegetables, whole grains, lean protein.  Ensure you are consuming 64 ounces of water daily.  Continue exercising. You should be getting 150 minutes of moderate intensity exercise weekly.  Please schedule a follow up appointment in 6 months for your Medicare Wellness Visit and physical.   It was a pleasure to see you today!   Diabetes Mellitus and Nutrition When you have diabetes (diabetes mellitus), it is very important to have healthy eating habits because your blood sugar (glucose) levels are greatly affected by what you eat and drink. Eating healthy foods in the appropriate amounts, at about the same times every day, can help you:  Control your blood glucose.  Lower your risk of heart disease.  Improve your blood pressure.  Reach or maintain a healthy weight.  Every person with diabetes is different, and each person has different needs for a meal plan. Your health care provider may recommend that you work with a diet and nutrition specialist (dietitian) to make a meal plan that is best for you. Your meal plan may vary depending on factors such as:  The calories you need.  The medicines you take.  Your weight.  Your blood glucose, blood pressure, and cholesterol levels.  Your activity level.  Other health conditions you have, such as heart or kidney disease.  How do carbohydrates affect me? Carbohydrates affect your blood glucose level more than any other type of food. Eating carbohydrates naturally increases the amount of glucose in your blood.  Carbohydrate counting is a method for keeping track of how many carbohydrates you eat. Counting carbohydrates is important to keep your blood glucose at a healthy level, especially if you use insulin or take certain oral diabetes medicines. It is important to know how many carbohydrates you can safely have in each meal. This is different for every person. Your dietitian can help you calculate how many carbohydrates you should have at each meal and for snack. Foods that contain carbohydrates include:  Bread, cereal, rice, pasta, and crackers.  Potatoes and corn.  Peas, beans, and lentils.  Milk and yogurt.  Fruit and juice.  Desserts, such as cakes, cookies, ice cream, and candy.  How does alcohol affect me? Alcohol can cause a sudden decrease in blood glucose (hypoglycemia), especially if you use insulin or take certain oral diabetes medicines. Hypoglycemia can be a life-threatening condition. Symptoms of hypoglycemia (sleepiness, dizziness, and confusion) are similar to symptoms of having too much alcohol. If your health care provider says that alcohol is safe for you, follow these guidelines:  Limit alcohol intake to no more than 1 drink per day for nonpregnant women and 2 drinks per day for men. One drink equals 12 oz of beer, 5 oz of wine, or 1 oz of hard liquor.  Do not drink on an empty stomach.  Keep yourself hydrated with water, diet soda, or unsweetened iced tea.  Keep in mind that regular soda, juice, and other mixers may contain a lot of sugar and must be counted as carbohydrates.  What are tips for following this plan?  Reading food labels  Start by checking the serving size on the label. The amount of calories, carbohydrates, fats, and other nutrients listed on the label are based on one serving of the food. Many foods contain more than one serving per package.  Check the total grams (g) of carbohydrates in one serving. You can calculate the number of servings of  carbohydrates in one serving by dividing the total carbohydrates by 15. For example, if a food has 30 g of total carbohydrates, it would be equal to 2 servings of carbohydrates.  Check the number of grams (g) of saturated and trans fats in one serving. Choose foods that have low or no amount of these fats.  Check the number of milligrams (mg) of sodium in one serving. Most people should limit total sodium intake to less than 2,300 mg per day.  Always check the nutrition information of foods labeled as "low-fat" or "nonfat". These foods may be higher in added sugar or refined carbohydrates and should be avoided.  Talk to your dietitian to identify your daily goals for nutrients listed on the label. Shopping  Avoid buying canned, premade, or processed foods. These foods tend to be high in fat, sodium, and added sugar.  Shop around the outside edge of the grocery store. This includes fresh fruits and vegetables, bulk grains, fresh meats, and fresh dairy. Cooking  Use low-heat cooking methods, such as baking, instead of high-heat cooking methods like deep frying.  Cook using healthy oils, such as olive, canola, or sunflower oil.  Avoid cooking with butter, cream, or high-fat meats. Meal planning  Eat meals and snacks regularly, preferably at the same times every day. Avoid going long periods of time without eating.  Eat foods high in fiber, such as fresh fruits, vegetables, beans, and whole grains. Talk to your dietitian about how many servings of carbohydrates you can eat at each meal.  Eat 4-6 ounces of lean protein each day, such as lean meat, chicken, fish, eggs, or tofu. 1 ounce is equal to 1 ounce of meat, chicken, or fish, 1 egg, or 1/4 cup of tofu.  Eat some foods each day that contain healthy fats, such as avocado, nuts, seeds, and fish. Lifestyle   Check your blood glucose regularly.  Exercise at least 30 minutes 5 or more days each week, or as told by your health care  provider.  Take medicines as told by your health care provider.  Do not use any products that contain nicotine or tobacco, such as cigarettes and e-cigarettes. If you need help quitting, ask your health care provider.  Work with a Social worker or diabetes educator to identify strategies to manage stress and any emotional and social challenges. What are some questions to ask my health care provider?  Do I need to meet with a diabetes educator?  Do I need to meet with a dietitian?  What number can I call if I have questions?  When are the best times to check my blood glucose? Where to find more information:  American Diabetes Association: diabetes.org/food-and-fitness/food  Academy of Nutrition and Dietetics: PokerClues.dk  [employer] of Diabetes and Digestive and Kidney Diseases (NIH): ContactWire.be Summary  A healthy meal plan will help you control your blood glucose and maintain a healthy lifestyle.  Working with a diet and nutrition specialist (dietitian) can help you make a meal plan that is best for you.  Keep in mind that carbohydrates and alcohol have immediate effects on your blood glucose levels. It  is important to count carbohydrates and to use alcohol carefully. This information is not intended to replace advice given to you by your health care provider. Make sure you discuss any questions you have with your health care provider. Document Released: 01/01/2005 Document Revised: 05/11/2016 Document Reviewed: 05/11/2016 Elsevier Interactive Patient Education  [employer].

## 2017-11-11 NOTE — Progress Notes (Signed)
Subjective:    Patient ID: Monica Villanueva, female    DOB: August 08, 1945, 72 y.o.   MRN: [id number]  HPI  Monica Villanueva is a 72 year old female who presents today for follow up.  1) Type 2 Diabetes:  Current medications include: None   Last A1C: 7.8 in January 2019 Last Eye Exam: Due in January 2020 Last Foot Exam: Due in January 2020 Pneumonia Vaccination: Completed in 2019 ACE/ARB: Urine microalbumin negative in 2019 Statin: LDL of 143, no statin.  Diet currently consists of:  Breakfast: Oatmeal, cereal, boiled egg Lunch: Sardines, canned tuna, vegetables, fruit Dinner: Salad, vegetables, sweet potato  Snacks: Fruit snacks, vegetables  Desserts: Occasionally Beverages: Water, un-sweet tea  Exercise: She is walking about one hour several days weekly   The 10-year ASCVD risk score Mikey Bussing DC Jr., et al., 2013) is: 27.7%   Values used to calculate the score:     Age: 82 years     Sex: Female     Is Non-Hispanic African American: Yes     Diabetic: Yes     Tobacco smoker: No     Systolic Blood Pressure: 765 mmHg     Is BP treated: Yes     HDL Cholesterol: 50.4 mg/dL     Total Cholesterol: 206 mg/dL  BP Readings from Last 3 Encounters:  11/11/17 118/68  11/04/17 124/76  05/14/17 122/70      Review of Systems  Eyes: Negative for visual disturbance.  Respiratory: Negative for shortness of breath.   Cardiovascular: Negative for chest pain.  Neurological: Negative for dizziness and headaches.       Past Medical History:  Diagnosis Date  . Cancer ([place])   . GERD (gastroesophageal reflux disease)    OCC  . Keloid of skin    right axilla     Social History   Socioeconomic History  . Marital status: Widowed    Spouse name: Not on file  . Number of children: Not on file  . Years of education: Not on file  . Highest education level: Not on file  Occupational History  . Not on file  Social Needs  . Financial resource strain: Not on file  . Food insecurity:   Worry: Not on file    Inability: Not on file  . Transportation needs:    Medical: Not on file    Non-medical: Not on file  Tobacco Use  . Smoking status: Never Smoker  . Smokeless tobacco: Never Used  Substance and Sexual Activity  . Alcohol use: Yes    Comment: occasional  . Drug use: No  . Sexual activity: Not Currently  Lifestyle  . Physical activity:    Days per week: Not on file    Minutes per session: Not on file  . Stress: Not on file  Relationships  . Social connections:    Talks on phone: Not on file    Gets together: Not on file    Attends religious service: Not on file    Active member of club or organization: Not on file    Attends meetings of clubs or organizations: Not on file    Relationship status: Not on file  . Intimate partner violence:    Fear of current or ex partner: Not on file    Emotionally abused: Not on file    Physically abused: Not on file    Forced sexual activity: Not on file  Other Topics Concern  . Not on file  Social  History Narrative   Widowed.   2 children, 4 grandchildren.   Retired. Previously worked at [employer].   Enjoys writing, playing on the key boards.    Past Surgical History:  Procedure Laterality Date  . ABDOMINAL HYSTERECTOMY  1996  . BREAST BIOPSY Right 07/15/2016   stereo bx of calcs LOQ-path pending  . BREAST BIOPSY Right 07/15/2016   [place] bx of mass-path pending  . BREAST CYST ASPIRATION Left 07/15/2016  . COLONOSCOPY    . SENTINEL NODE BIOPSY Right 08/11/2016   Procedure: SENTINEL NODE BIOPSY;  Surgeon: Leonie Green, MD;  Location: [facility] OR[facility];  Service: General;  Laterality: Right;  . SIMPLE MASTECTOMY WITH AXILLARY SENTINEL NODE BIOPSY Right 08/11/2016   Procedure: SIMPLE MASTECTOMY;  Surgeon: Leonie Green, MD;  Location: [facility];  Service: General;  Laterality: Right;    Family History  Problem Relation Age of Onset  . Hypertension Mother   . Hypertension Sister   . Breast  cancer Neg Hx     No Known Allergies  Current Outpatient Medications on File Prior to Visit  Medication Sig Dispense Refill  . amLODipine (NORVASC) 10 MG tablet Take 1 tablet (10 mg total) by mouth daily. 90 tablet 3  . anastrozole (ARIMIDEX) 1 MG tablet Take 1 tablet (1 mg total) by mouth daily. 90 tablet 3  . calcium carbonate (TUMS - DOSED IN MG ELEMENTAL CALCIUM) 500 MG chewable tablet Chew 1 tablet by mouth as needed for indigestion or heartburn.    . Calcium-Magnesium-Vitamin D (CALCIUM 1200+D3 PO) Take 2 tablets by mouth daily. Calcium 1200 units / Vit D 800 mg    . Krill Oil (OMEGA-3) 500 MG CAPS Take 1 capsule by mouth daily.     No current facility-administered medications on file prior to visit.     BP 118/68   Pulse 62   Temp 98.1 F (36.7 C) (Oral)   Wt 220 lb 4 oz (99.9 kg)   SpO2 95%   BMI 40.28 kg/m    Objective:   Physical Exam  Constitutional: She is oriented to person, place, and time. She appears well-nourished.  HENT:  Mouth/Throat: No oropharyngeal exudate.  Eyes: Pupils are equal, round, and reactive to light. EOM are normal.  Neck: Neck supple. No thyromegaly present.  Cardiovascular: Normal rate and regular rhythm.  Respiratory: Effort normal and breath sounds normal.  GI: Soft. Bowel sounds are normal. There is no tenderness.  Musculoskeletal: Normal range of motion.  Neurological: She is alert and oriented to person, place, and time.  Skin: Skin is warm and dry.  Psychiatric: She has a normal mood and affect.           Assessment & Plan:

## 2017-11-11 NOTE — Assessment & Plan Note (Signed)
Last A1C borderline.  Check A1C and lipids today. Discussed the potential need for diabetes and lipid treatment, she verbalized understanding. Also discussed diabetic diet.  Eye and foot exam UTD. Pneumonia vaccination UTD. Lipids pending. Urine microalbumin negative in Januar 2019.  Consider Metformin and statin. Will await labs.

## 2017-11-12 ENCOUNTER — Other Ambulatory Visit: Payer: Self-pay | Admitting: Primary Care

## 2017-11-12 DIAGNOSIS — E785 Hyperlipidemia, unspecified: Secondary | ICD-10-CM

## 2017-11-12 MED ORDER — ATORVASTATIN CALCIUM 20 MG PO TABS: ORAL_TABLET | ORAL | 3 refills | Status: DC

## 2017-11-12 NOTE — Assessment & Plan Note (Signed)
Recent LDL of 138.  The 10-year ASCVD risk score Mikey Bussing DC Brooke Bonito., et al., 2013) is: 24.6%   Values used to calculate the score:     Age: 72 years     Sex: Female     Is Non-Hispanic African American: Yes     Diabetic: Yes     Tobacco smoker: No     Systolic Blood Pressure: 521 mmHg     Is BP treated: Yes     HDL Cholesterol: 47.3 mg/dL     Total Cholesterol: 198 mg/dL  Given diabetes and recent lipid panel, will initiate atorvastatin 20 mg. Rx sent to pharmacy. Repeat lipids and LFT's in 6 weeks.

## 2017-12-01 ENCOUNTER — Other Ambulatory Visit: Payer: Self-pay | Admitting: Primary Care

## 2017-12-24 ENCOUNTER — Other Ambulatory Visit: Payer: Self-pay | Admitting: Primary Care

## 2017-12-24 ENCOUNTER — Other Ambulatory Visit (INDEPENDENT_AMBULATORY_CARE_PROVIDER_SITE_OTHER): Payer: Medicare HMO

## 2017-12-24 DIAGNOSIS — E785 Hyperlipidemia, unspecified: Secondary | ICD-10-CM

## 2017-12-24 LAB — HEPATIC FUNCTION PANEL
ALK PHOS: 94 U/L (ref 39–117)
ALT: 13 U/L (ref 0–35)
AST: 16 U/L (ref 0–37)
Albumin: 4 g/dL (ref 3.5–5.2)
BILIRUBIN DIRECT: 0.1 mg/dL (ref 0.0–0.3)
BILIRUBIN TOTAL: 0.6 mg/dL (ref 0.2–1.2)
TOTAL PROTEIN: 7.7 g/dL (ref 6.0–8.3)

## 2017-12-24 LAB — LIPID PANEL
Cholesterol: 133 mg/dL (ref 0–200)
HDL: 45.4 mg/dL (ref >39.00)
LDL Cholesterol: 75 mg/dL (ref 0–99)
NonHDL: 87.59
Total CHOL/HDL Ratio: 3
Triglycerides: 62 mg/dL (ref 0.0–149.0)
VLDL: 12.4 mg/dL (ref 0.0–40.0)

## 2018-01-06 IMAGING — MG MM BREAST LOCALIZATION CLIP
2 series · 2 of 2 positions shown · non-contrast
Comparison: Previous exam(s).

CLINICAL DATA: Status post stereotactic guided biopsy of
indeterminate right breast calcifications in the lower, outer
quadrant and ultrasound-guided biopsy of a right breast mass at 2
o'clock

EXAM:
DIAGNOSTIC RIGHT MAMMOGRAM POST STEREOTACTIC AND ULTRASOUND BIOPSIES

[R CC]
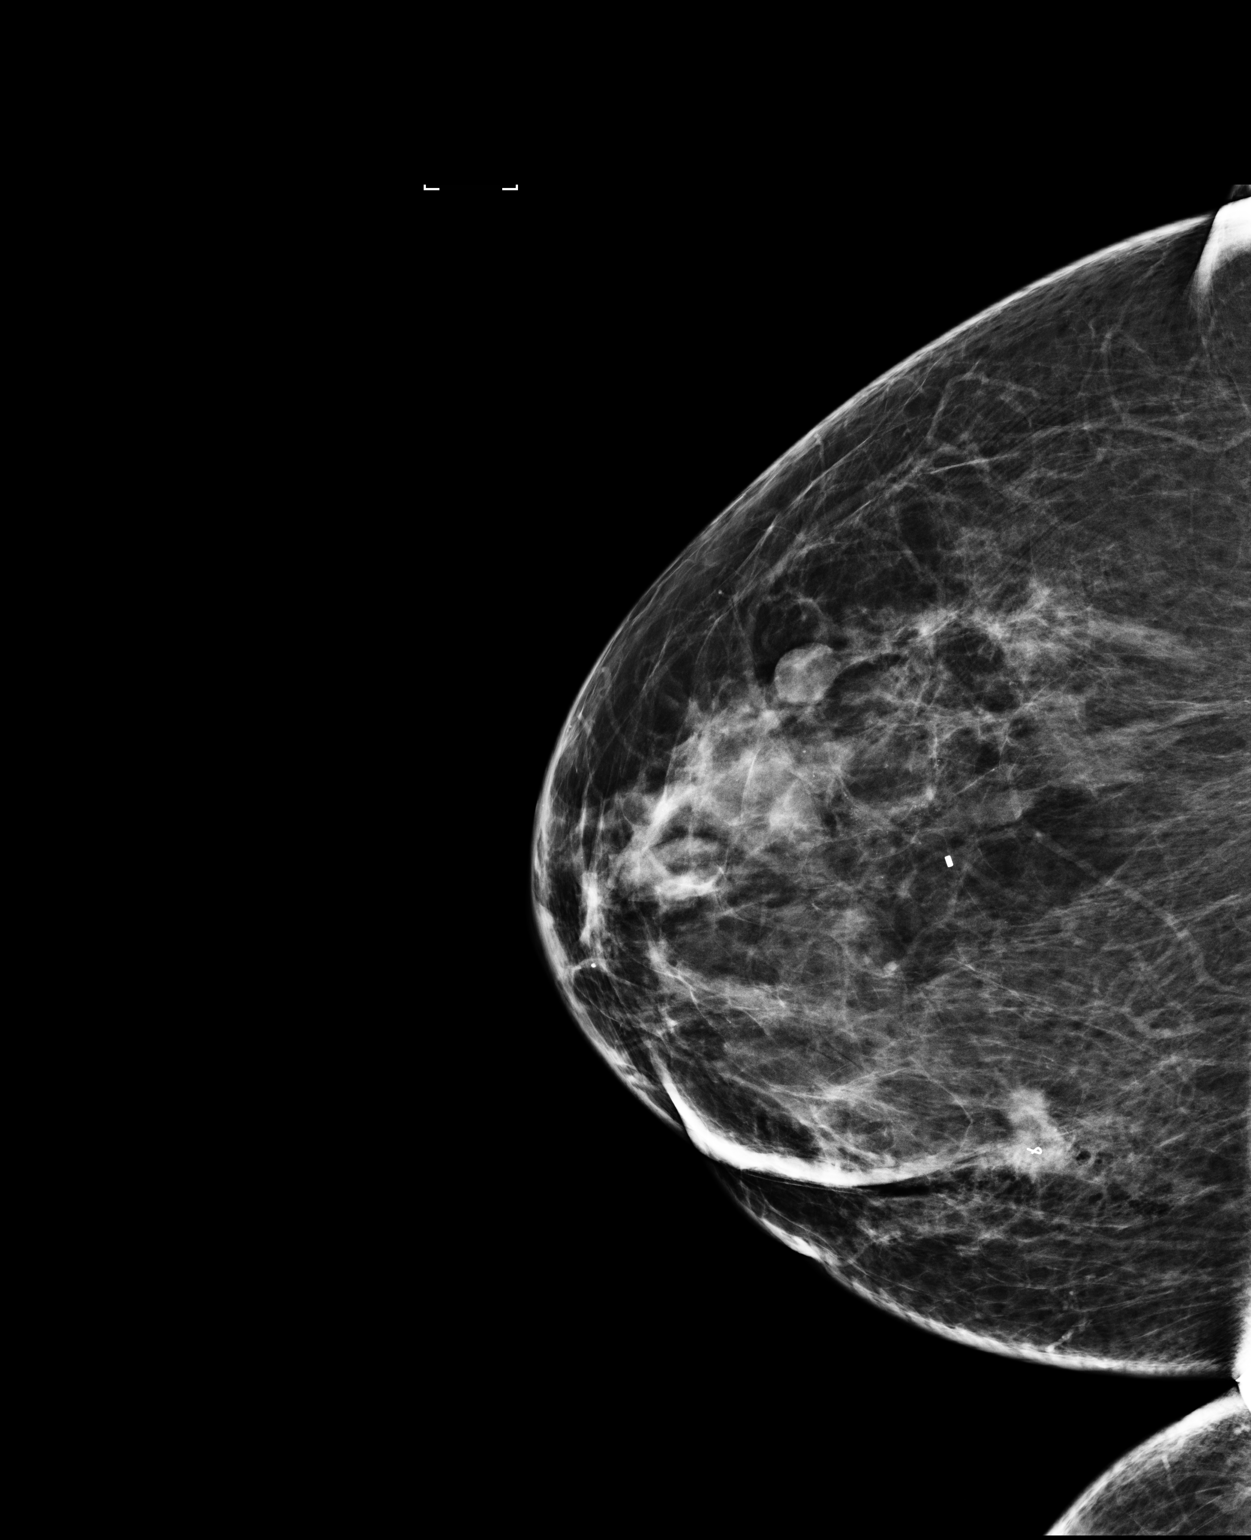

[R ML]
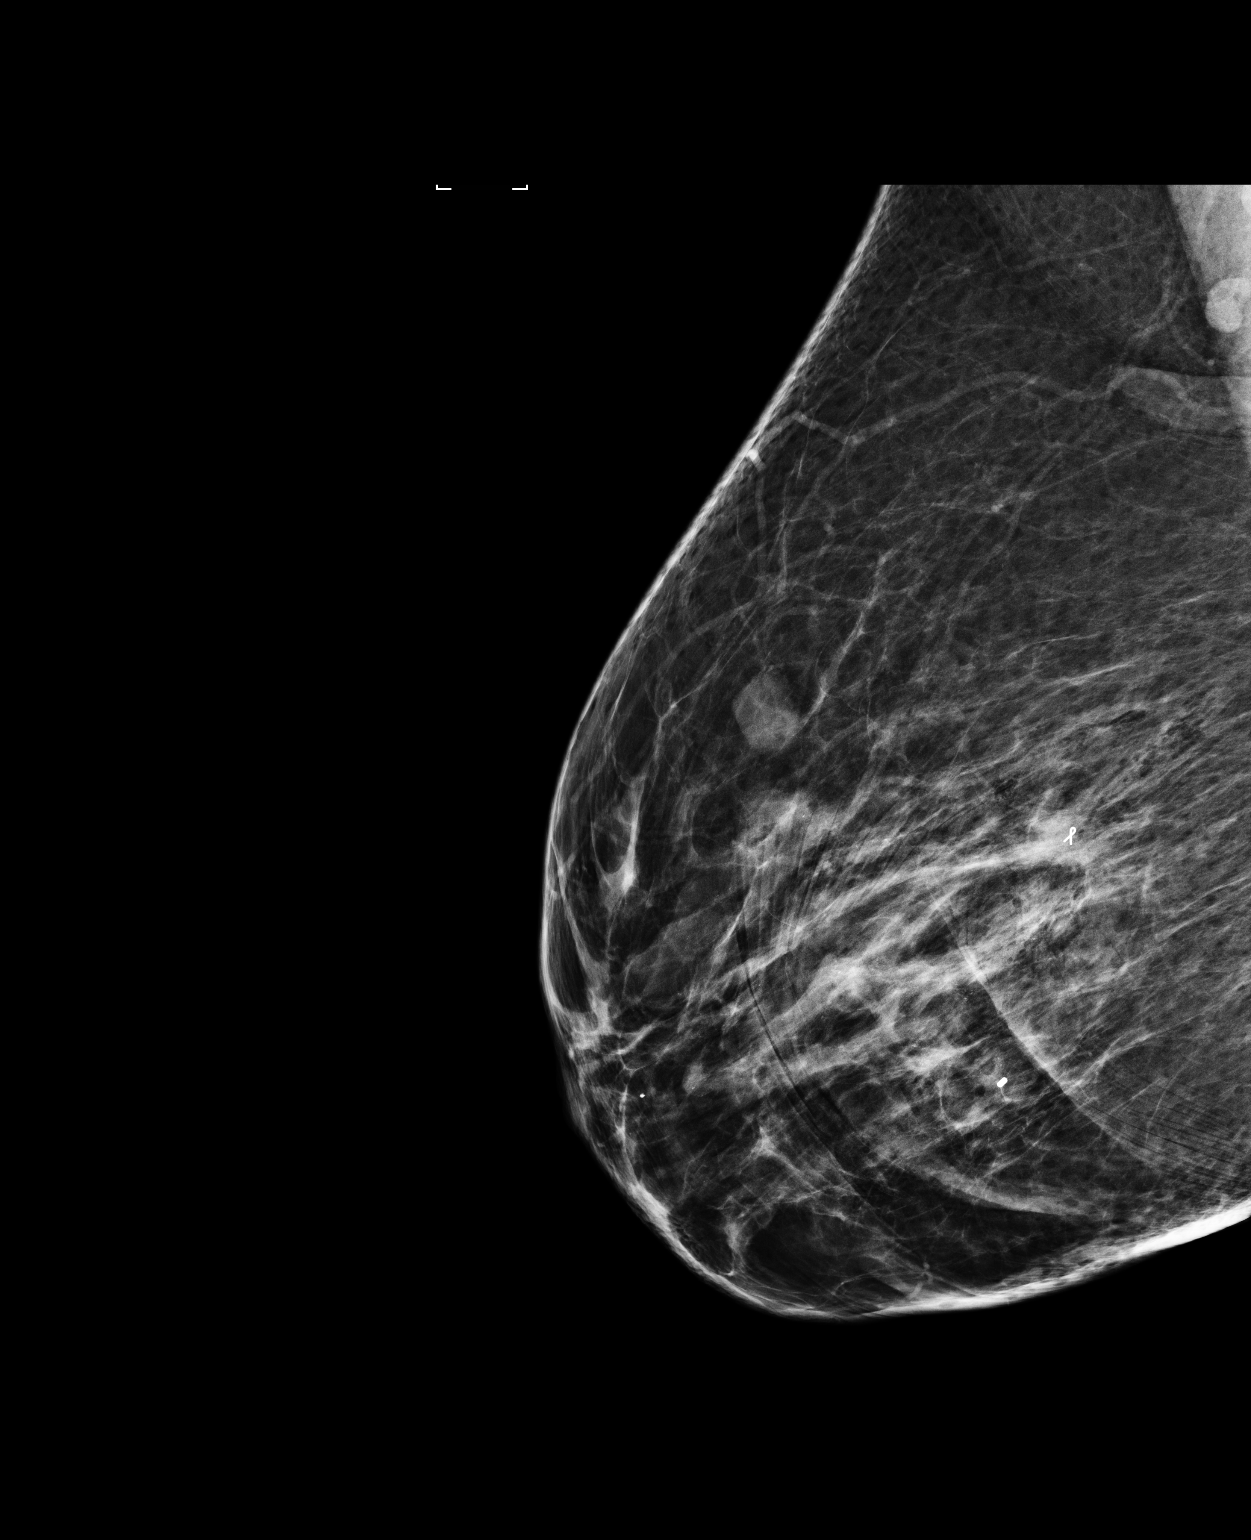

[2 of 2 positions shown; findings below may reference images not displayed]

FINDINGS: Mammographic images were obtained following stereotactic guided
biopsy of indeterminate right breast calcifications in the lower,
outer quadrant and ultrasound-guided biopsy of a right breast mass
at 2 o'clock. Post biopsy mammogram demonstrates the cylinder-shaped
biopsy marker to be in the expected location of the calcifications
within the lower, slightly outer right breast and the ribbon shaped
biopsy marker to be within the mass at 2 o'clock.
IMPRESSION: Appropriate marker positions as above.

Final Assessment: Post Procedure Mammograms for Marker Placement

## 2018-01-06 IMAGING — MG US  BREAST BX W/ LOC DEV 1ST LESION IMG BX SPEC US GUIDE*R*
1 series · 8 of 8 positions shown · non-contrast
Comparison: Previous exam(s).

ADDENDUM:
Pathology of the ultrasound guided biopsy of the right breast, 2:00
o'clock, 7 cm from nipple revealed BREAST, RIGHT, 2 O'CLOCK, 7 CM
FROM NIPPLE; ULTRASOUND-GUIDED CORE

BIOPSY: INVASIVE MAMMARY CARCINOMA, NO SPECIAL TYPE.Size of invasive
carcinoma: 13 mm. Histologic grade of invasive carcinoma: Grade 1.
Ductal carcinoma in situ: Not identified. Lymphovascular invasion:
Not identified. Comment: The definitive grade will be assigned on
the excisional specimen. The findings were communicated to Vera
Markeino in the [facility]HOSPITAL[facility] on 07/16/16 at [DATE].
Read-back procedure was performed. This was found to be concordant
with Dr. Soriao impression and notes.
Recommendations:  Surgical and oncology referrals.
At the patient's request, results and recommendations were relayed
to the patient by phone by Dr. Nomasibulele on 07/21/16. The patient
stated she has done well following the biopsy. All of her questions
were answered. She was informed that she will be contacted by the
nurse navigators at [HOSPITAL] with referral
information.
Results and recommendations were relayed to Btwo Vigo, RN,
nurse navigator for [HOSPITAL] by Snyrt
Tandaju, [place] on 07/21/16. Appointments were made with Dr. Markeino,
surgeon for 07/23/16 at [DATE] and with Dr. Paulus N, medical oncologist
for 07/31/16 at [DATE]. The patient has been notified of the
appointment information.
Addendum by Billie, Che Wah on 07/21/16.
CLINICAL DATA: 70-year-old female with suspicious right breast mass
at 2 o'clock, 7 cm from the nipple
EXAM:
ULTRASOUND GUIDED RIGHT BREAST CORE NEEDLE BIOPSY

[Series 1: MG view · 0.08mm/px · 8 of 22 slices shown]
[im 1/22]
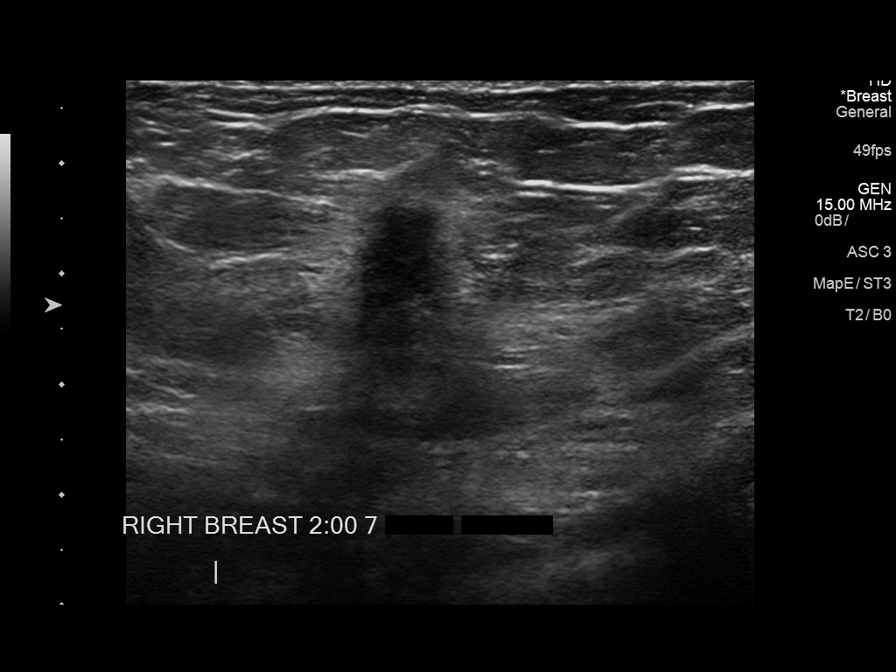
[im 4/22]
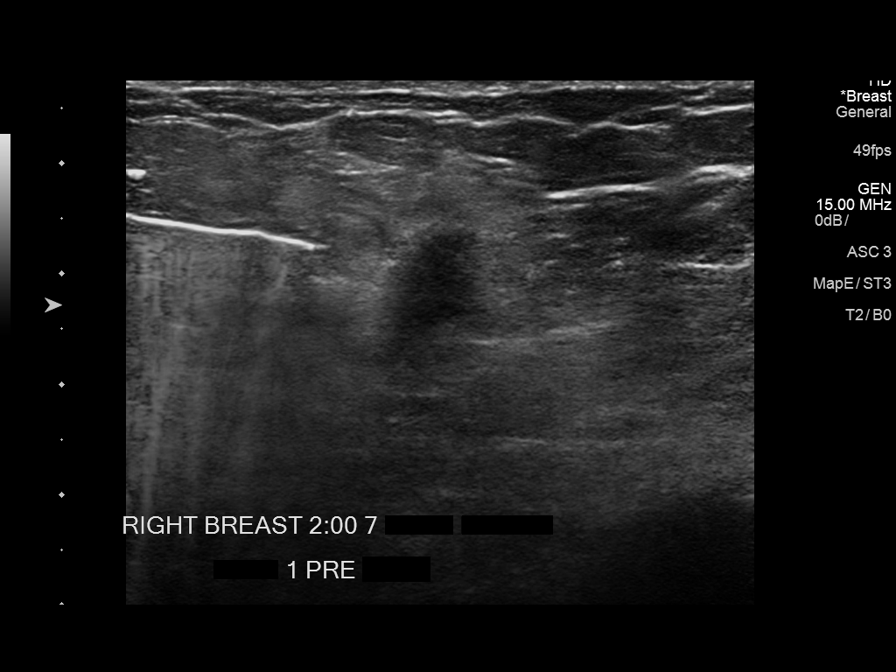
[im 7/22]
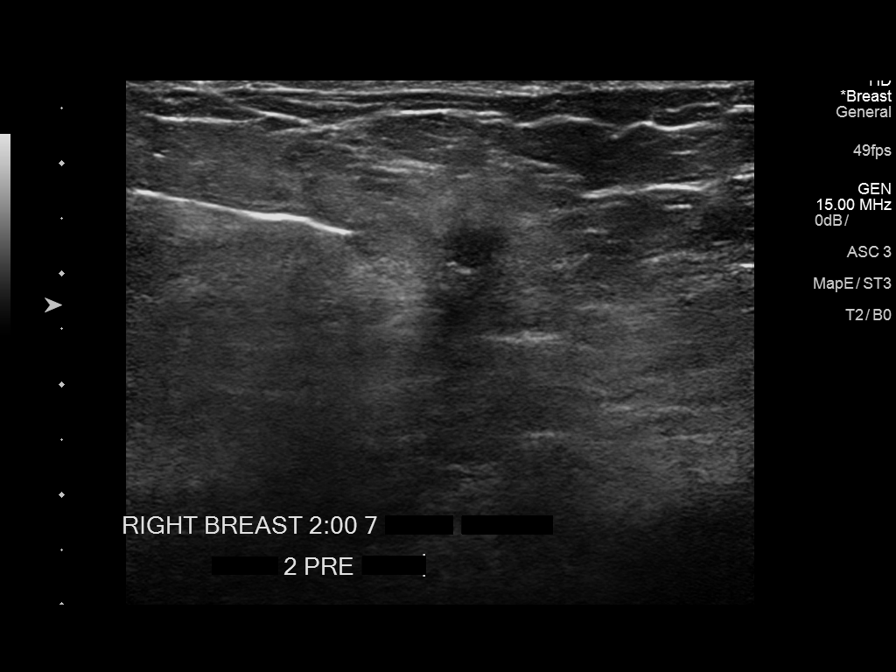
[im 10/22]
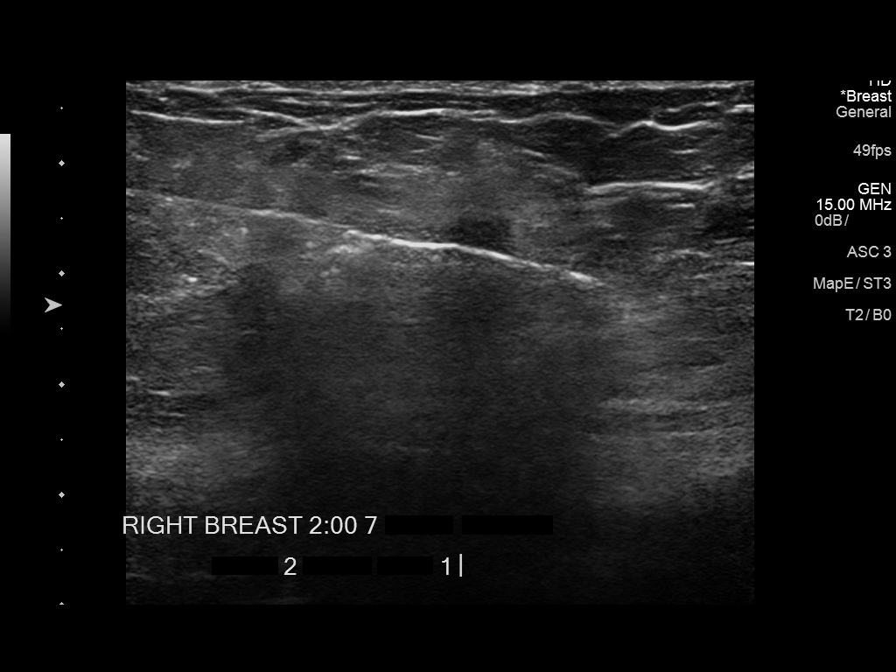
[im 13/22]
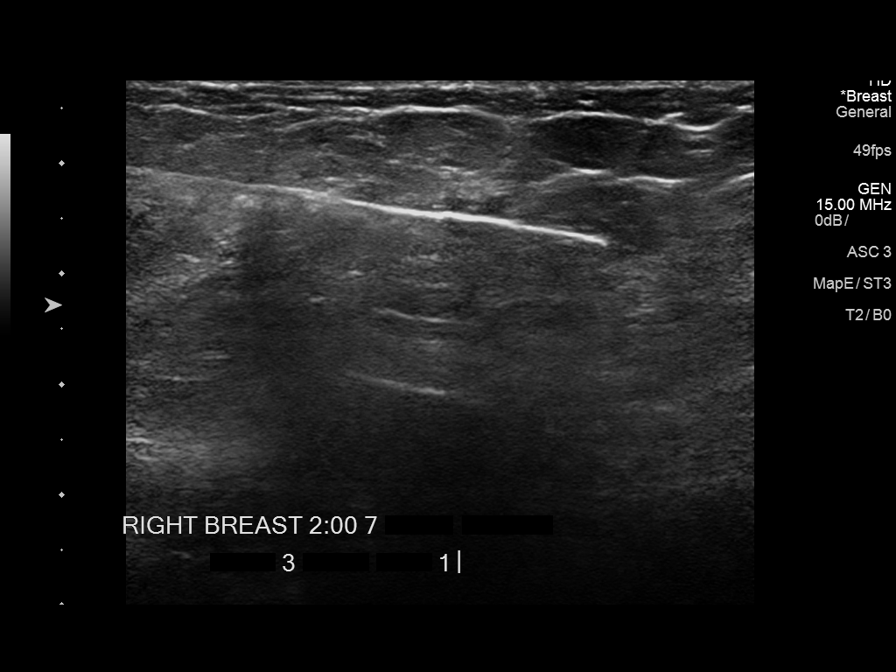
[im 16/22]
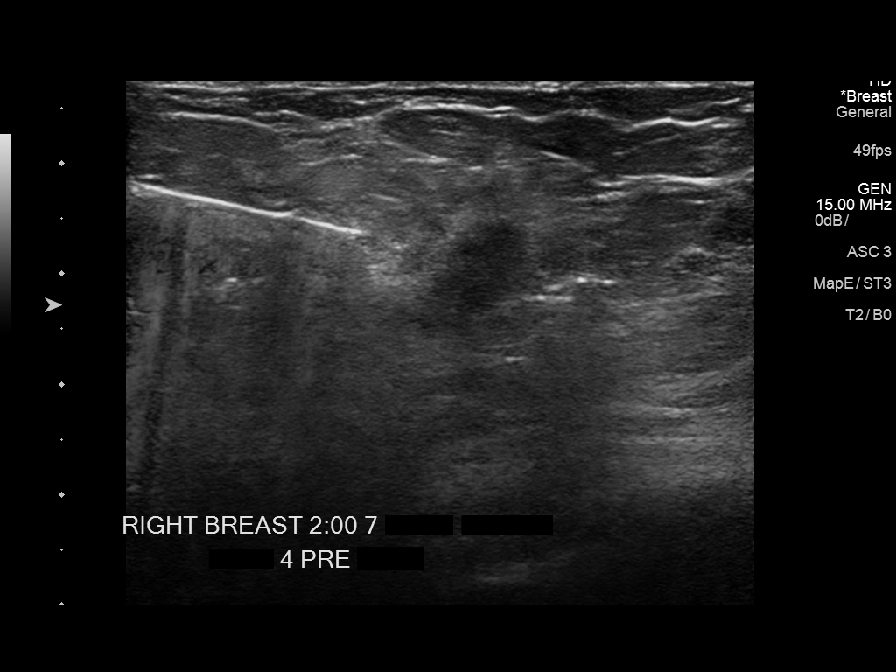
[im 19/22]
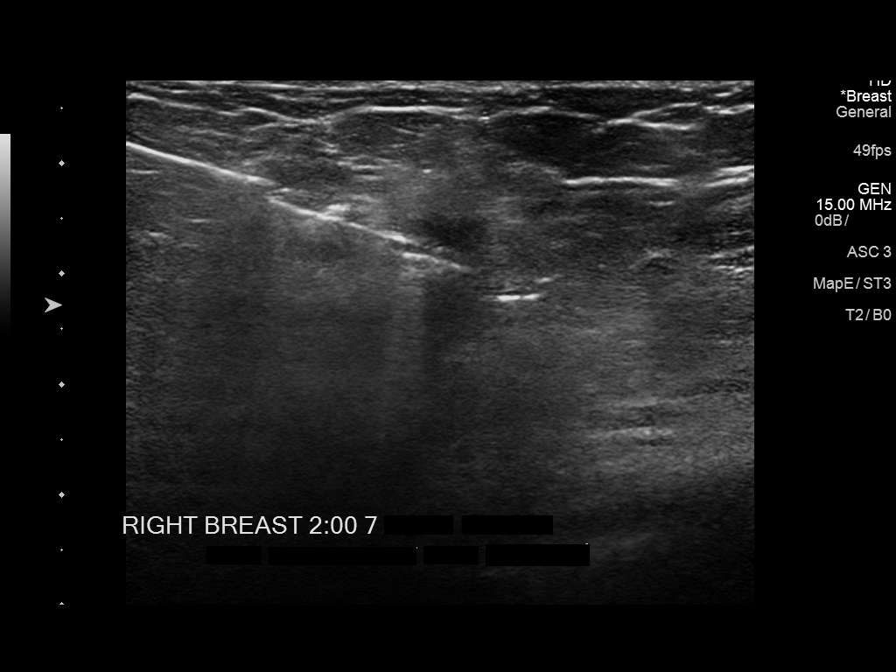
[im 22/22]
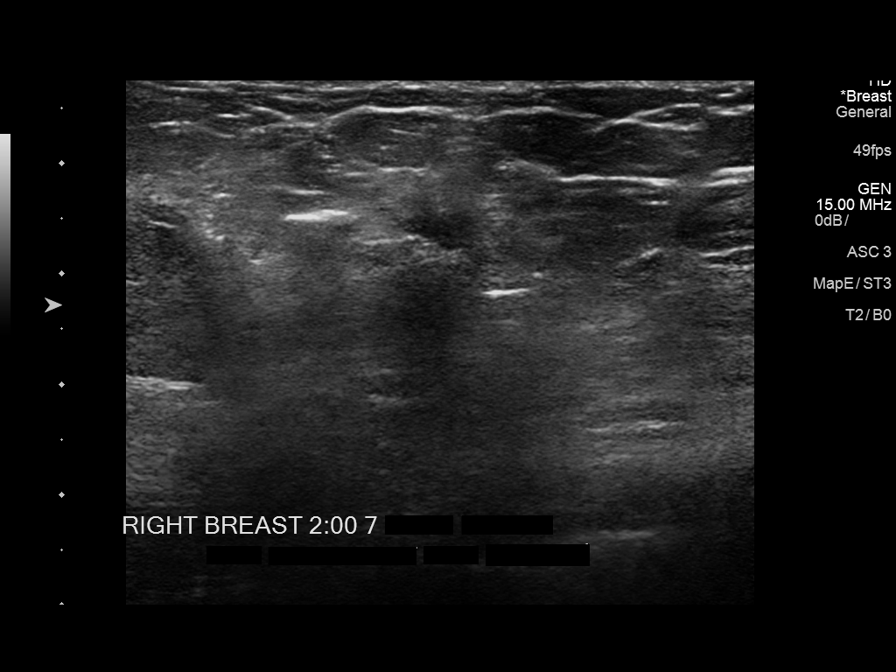

[8 of 8 positions shown; findings below may reference images not displayed]



Using sterile technique and 1% Lidocaine as local anesthetic, under
direct ultrasound visualization, a 12 gauge Ferienhaus device was
used to perform biopsy of an indeterminate right breast mass at 2
o'clock, 7 cm from the nipple using a medial to lateral approach. At
the conclusion of the procedure a ribbon shaped tissue marker clip
was deployed into the biopsy cavity. Follow up 2 view mammogram was
performed and dictated separately.
IMPRESSION: Ultrasound guided biopsy of an indeterminate right breast mass at 2
o'clock. No apparent complications.

## 2018-01-06 IMAGING — MG MM BREAST BX W/ LOC DEV 1ST LESION IMAGE BX SPEC STEREO GUIDE*R*
8 series · 8 of 20 positions shown · non-contrast
Comparison: Previous exams.

ADDENDUM:
Pathology of the stereotactic guided biopsy right breast revealed
BREAST, RIGHT, LOWER OUTER QUADRANT; STEREOTACTIC-GUIDED CORE
BIOPSY: MICROINVASIVE LOBULAR CARCINOMA, 0.95 MM. DUCTAL CARCINOMA
IN SITU, INTERMEDIATE NUCLEAR GRADE WITH FOCAL NECROSIS, ASSOCIATED
WITH CALCIFICATIONS. Comment: A single 0.95 mm focus of invasive
lobular carcinoma, classic type, is identified and confirmed by
cytokeratin immunohistochemistry with appropriately stained control.
The tumor is arising in association with DCIS which is present in
several core fragments and probably represents an extensive
intraductal component. Classic invasive lobular carcinoma is
essentially always ER positive and HER2 negative, and the small size
is associated with very low risk. Given the concurrent larger tumor,
biomarkers are performed only on part B at this time. If a larger
invasive lobular component is discovered at a later time, biomarkers
can be done as clinically indicated. This was found to be concordant
with Dr. Ronlor impression and notes.

Recommendation:  Surgical and oncology referrals.
At the patient's request, results were relayed to the patient by
phone by Dr. Trinh on 07/21/16. The patient stated she has done
well following the biopsy. All of her questions were answered. She
was told she will be contacted by the nurse navigators who will
arrange her referrals.
Results and recommendations were relayed to Eilon Najjar, RN,
nurse navigator for [HOSPITAL] by Viktor
Shatri, Uzsorazsva. Appointments were made with Dr. Wuyika, surgeon for
07/23/16 at [DATE] and with Dr. Mekumba, medical oncology for 07/31/16 at
[DATE]. The patient has been notified of the appointments. She was
encouraged to call the [HOSPITAL] or the nurse
navigators with any further questions or concerns.
Addendum by Almendarez, Chella on 07/21/16.
CLINICAL DATA: 70-year-old female with indeterminate right breast
calcifications
EXAM:
RIGHT BREAST STEREOTACTIC CORE NEEDLE BIOPSY

[R LM (1 of 3)]
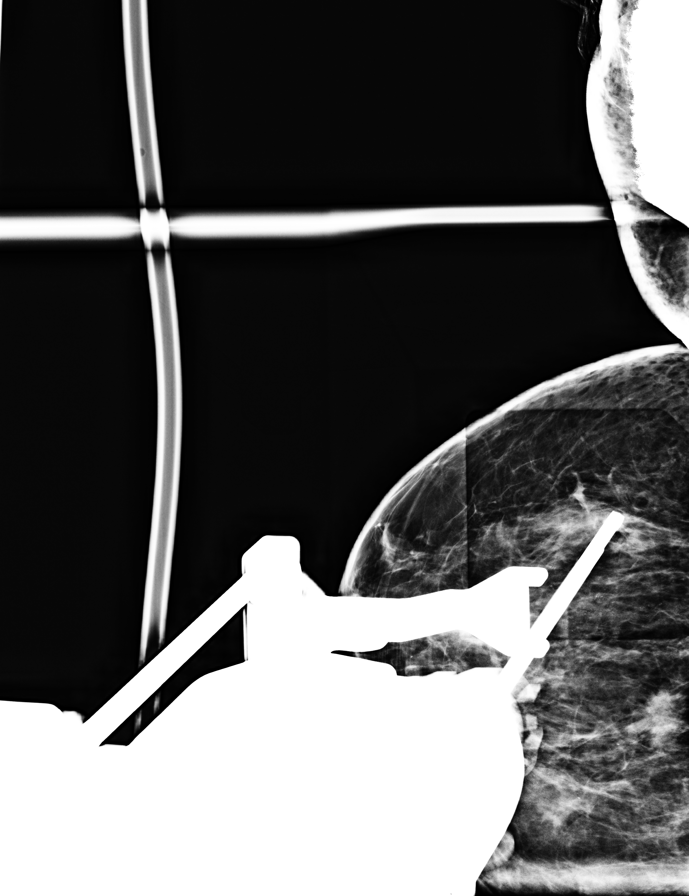

[R LM (2 of 3)]
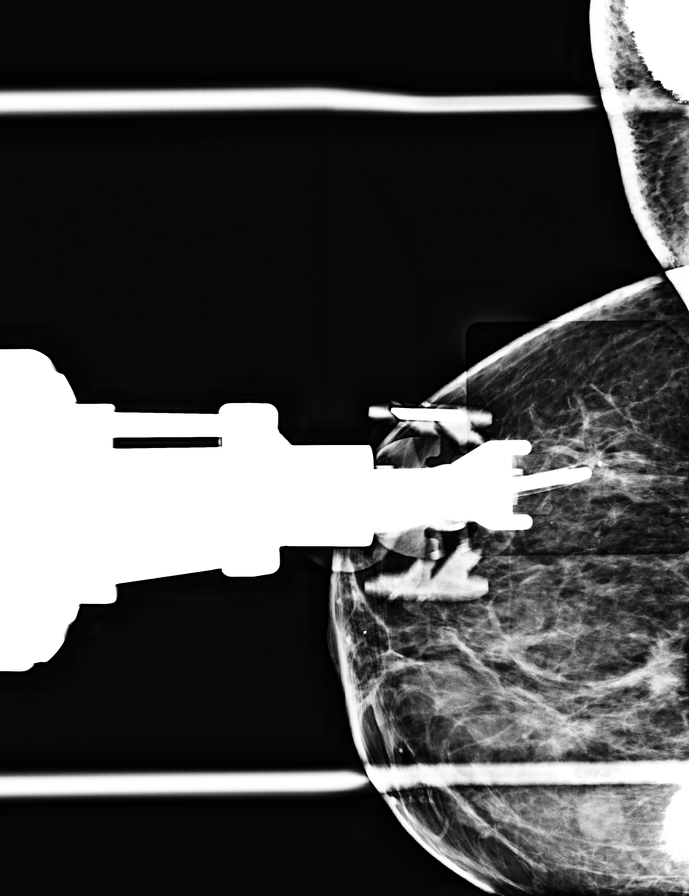

[R LM (3 of 3)]
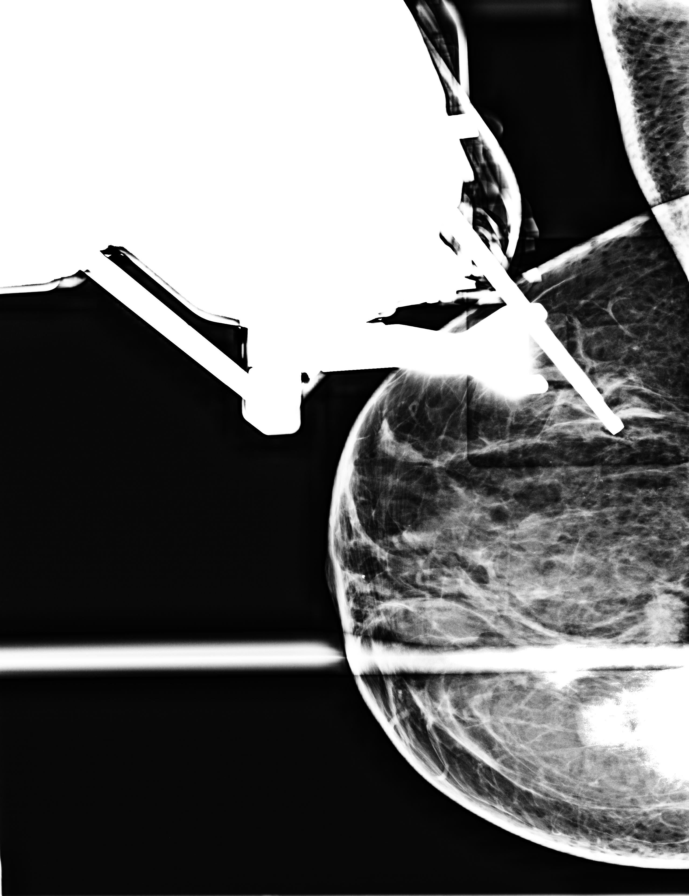

[R LM tomo (1 of 3) · tomo slice 29/56.0]
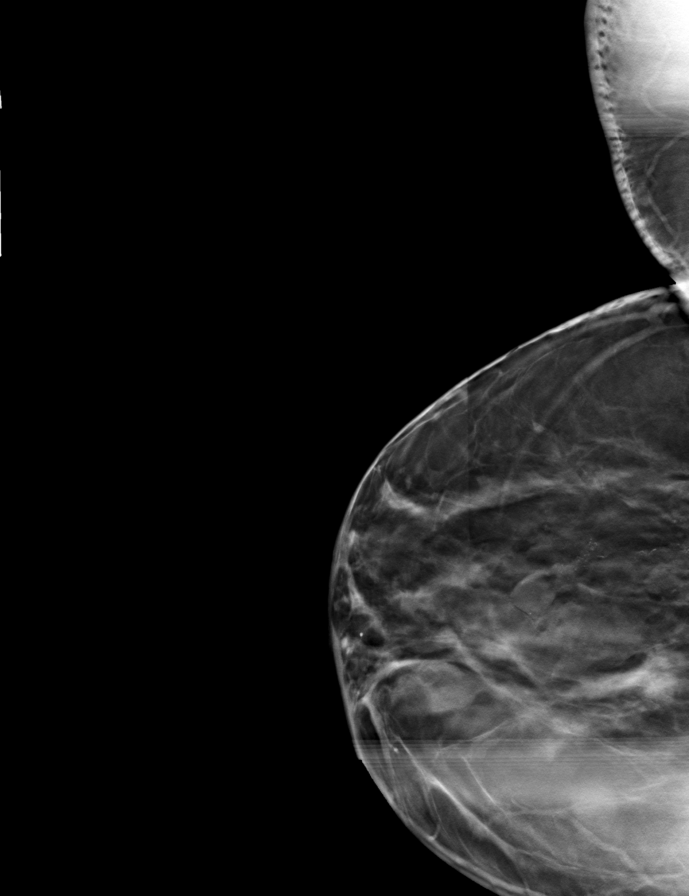

[R LM tomo (2 of 3) · tomo slice 28/55.0]
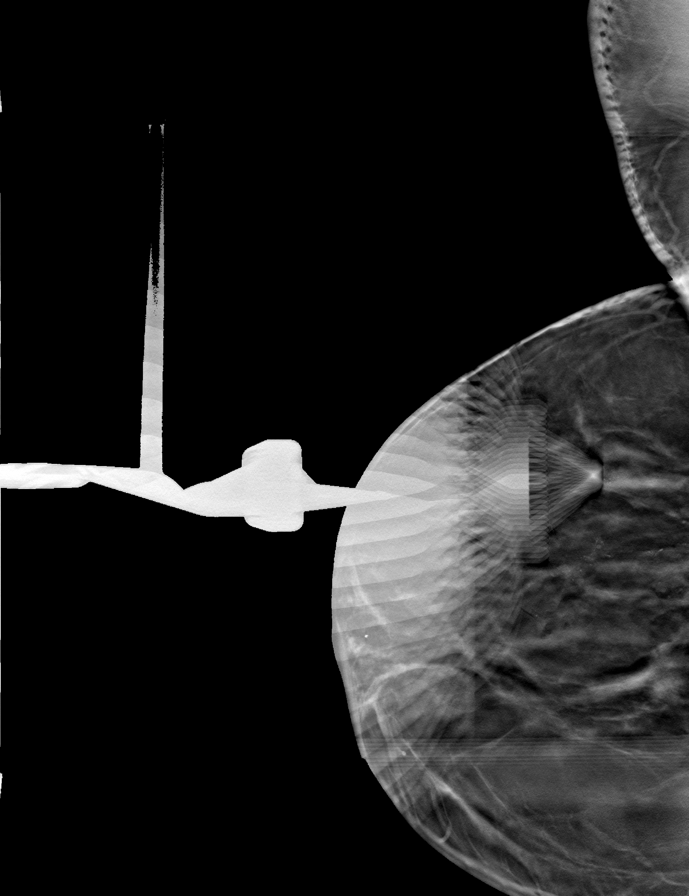

[R LM tomo (3 of 3) · tomo slice 28/55.0]
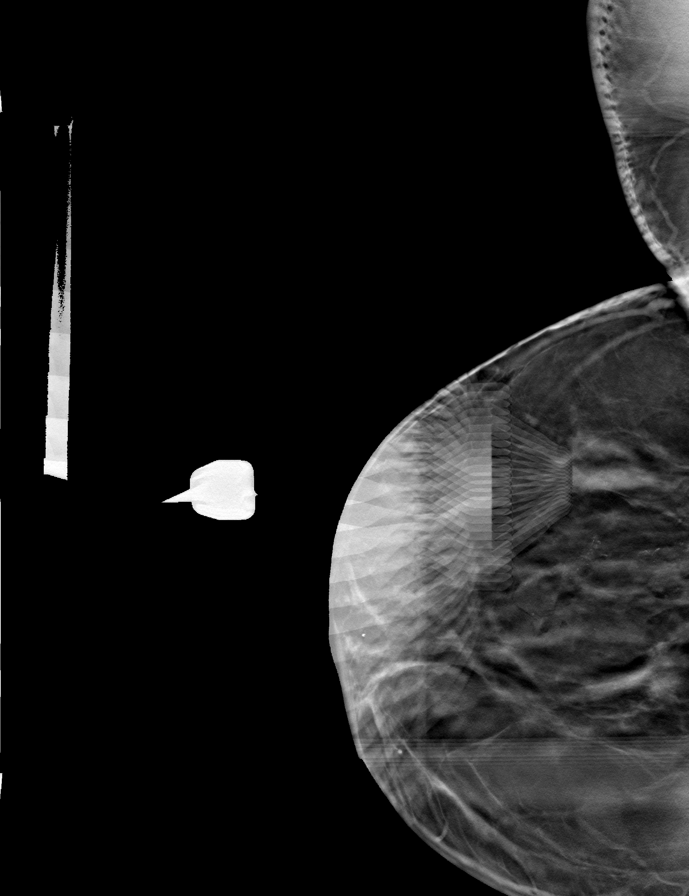

[R (1 of 2)]
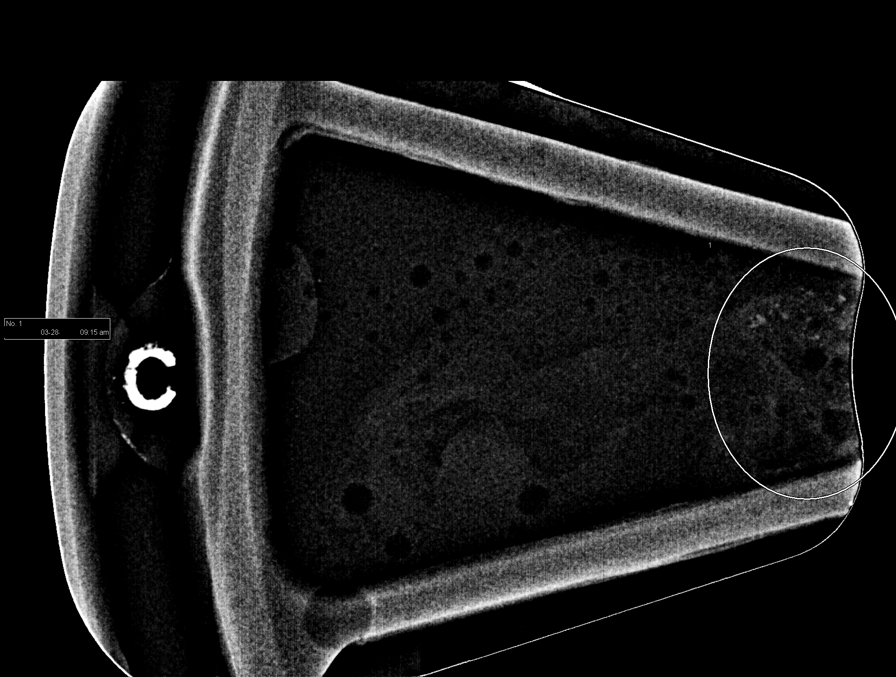

[R (2 of 2)]
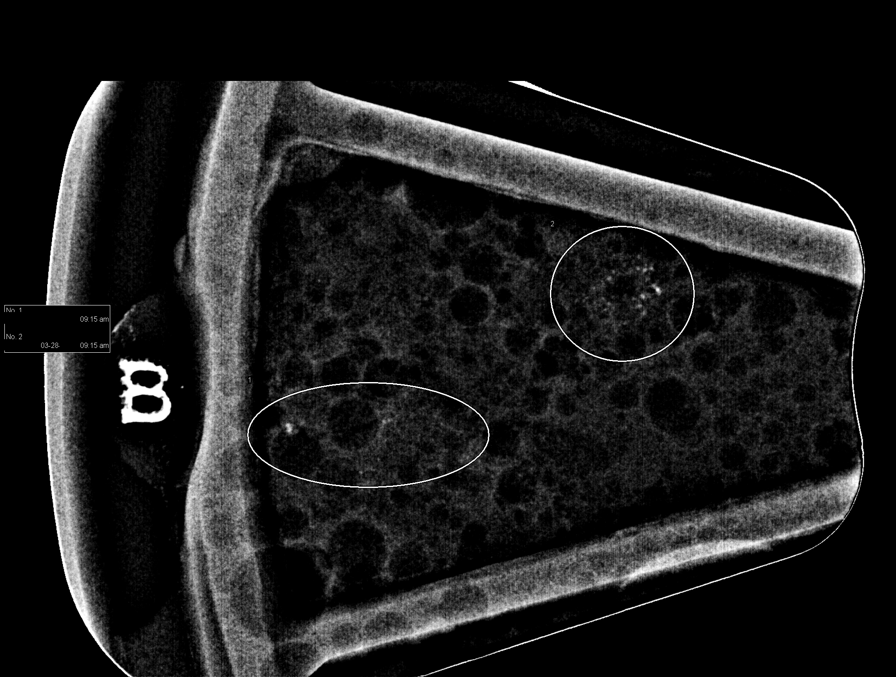

[8 of 20 positions shown; findings below may reference images not displayed]



Using sterile technique and 1% Lidocaine as local anesthetic, under
stereotactic guidance, a 9 gauge vacuum assist device was used to
perform core needle biopsy of calcifications in the lower outer
quadrant of the right breast using a lateral to medial approach.
Specimen radiograph was performed showing calcifications within the
specimen. Specimens with calcifications are identified for
pathology.

At the conclusion of the procedure, a cylinder-shaped tissue marker
clip was deployed into the biopsy cavity. Follow-up 2-view mammogram
was performed and dictated separately.
IMPRESSION: Stereotactic-guided biopsy of indeterminate right breast
calcifications. No apparent complications.

## 2018-01-27 ENCOUNTER — Ambulatory Visit (INDEPENDENT_AMBULATORY_CARE_PROVIDER_SITE_OTHER): Payer: Medicare HMO | Admitting: Internal Medicine

## 2018-01-27 ENCOUNTER — Encounter: Payer: Self-pay | Admitting: Internal Medicine

## 2018-01-27 VITALS — BP 122/84 | HR 70 | Temp 98.3°F | Wt 214.0 lb

## 2018-01-27 DIAGNOSIS — B029 Zoster without complications: Secondary | ICD-10-CM | POA: Diagnosis not present

## 2018-01-27 MED ORDER — VALACYCLOVIR HCL 1 G PO TABS: 1000.0000 mg | ORAL_TABLET | Freq: Three times a day (TID) | ORAL | 0 refills | Status: DC

## 2018-01-27 MED ORDER — GABAPENTIN 100 MG PO CAPS: 100.0000 mg | ORAL_CAPSULE | Freq: Three times a day (TID) | ORAL | 0 refills | Status: DC

## 2018-01-27 NOTE — Progress Notes (Signed)
Subjective:    Patient ID: Monica Villanueva, female    DOB: 1945-06-02, 72 y.o.   MRN: [id number]  HPI  Pt presents to the clinic today with c/o a rash on her upper abdomen under her breast. She noticed this 1 week ago. She actually had pain prior to the onset of the rash. The rash has spread to her right flank area. She describes the pain as sharp and stabbing. She denies recent changes in soap, lotion or detergents. No one around her has a similar rash. She has tried Cortisone and Tylenol with minimal relief.   Review of Systems  Past Medical History:  Diagnosis Date  . Cancer ([place])   . GERD (gastroesophageal reflux disease)    OCC  . Keloid of skin    right axilla    Current Outpatient Medications  Medication Sig Dispense Refill  . ACCU-CHEK FASTCLIX LANCETS MISC USE TO CHECK BLOOD GLUCOSE UP TO 4 TIMES DAILY 300 each 1  . ACCU-CHEK GUIDE test strip USE TO CHECK BLOOD GLUCOSE UP TO 4 TIMES DAILY 100 each 5  . amLODipine (NORVASC) 10 MG tablet Take 1 tablet (10 mg total) by mouth daily. 90 tablet 3  . anastrozole (ARIMIDEX) 1 MG tablet Take 1 tablet (1 mg total) by mouth daily. 90 tablet 3  . atorvastatin (LIPITOR) 20 MG tablet Take 1 tablet by mouth at bedtime for cholesterol. 90 tablet 3  . blood glucose meter kit and supplies KIT Dispense based on patient and insurance preference. Use up to four times daily as directed. (FOR ICD-9 250.00, 250.01). 1 each 0  . calcium carbonate (TUMS - DOSED IN MG ELEMENTAL CALCIUM) 500 MG chewable tablet Chew 1 tablet by mouth as needed for indigestion or heartburn.    . Calcium-Magnesium-Vitamin D (CALCIUM 1200+D3 PO) Take 2 tablets by mouth daily. Calcium 1200 units / Vit D 800 mg    . COLLAGEN PO Take by mouth.    Javier Docker Oil (OMEGA-3) 500 MG CAPS Take 1 capsule by mouth daily.    . Multiple Vitamins-Minerals (HAIR SKIN AND NAILS FORMULA) TABS Take by mouth.     No current facility-administered medications for this visit.     No Known  Allergies  Family History  Problem Relation Age of Onset  . Hypertension Mother   . Hypertension Sister   . Breast cancer Neg Hx     Social History   Socioeconomic History  . Marital status: Widowed    Spouse name: Not on file  . Number of children: Not on file  . Years of education: Not on file  . Highest education level: Not on file  Occupational History  . Not on file  Social Needs  . Financial resource strain: Not on file  . Food insecurity:    Worry: Not on file    Inability: Not on file  . Transportation needs:    Medical: Not on file    Non-medical: Not on file  Tobacco Use  . Smoking status: Never Smoker  . Smokeless tobacco: Never Used  Substance and Sexual Activity  . Alcohol use: Yes    Comment: occasional  . Drug use: No  . Sexual activity: Not Currently  Lifestyle  . Physical activity:    Days per week: Not on file    Minutes per session: Not on file  . Stress: Not on file  Relationships  . Social connections:    Talks on phone: Not on file    Gets  together: Not on file    Attends religious service: Not on file    Active member of club or organization: Not on file    Attends meetings of clubs or organizations: Not on file    Relationship status: Not on file  . Intimate partner violence:    Fear of current or ex partner: Not on file    Emotionally abused: Not on file    Physically abused: Not on file    Forced sexual activity: Not on file  Other Topics Concern  . Not on file  Social History Narrative   Widowed.   2 children, 4 grandchildren.   Retired. Previously worked at [employer].   Enjoys writing, playing on the key boards.     Constitutional: Denies fever, malaise, fatigue, headache or abrupt weight changes.  Skin: Pt reports rash of right upper abdomen, right flank. Denies ulcercations.    No other specific complaints in a complete review of systems (except as listed in HPI above).     Objective:   Physical  Exam   Pulse 70   Temp 98.3 F (36.8 C) (Oral)   Wt 214 lb (97.1 kg)   SpO2 98%   BMI 39.14 kg/m  Wt Readings from Last 3 Encounters:  01/27/18 214 lb (97.1 kg)  11/11/17 220 lb 4 oz (99.9 kg)  11/04/17 222 lb 14.4 oz (101.1 kg)    General: Appears her stated age, obese in NAD. Skin: Vesicular rash on erythematous base noted in dermatome pattern starting at right flank, extending to midline abdomen.   BMET    Component Value Date/Time   NA 138 11/04/2017 1324   K 4.0 11/04/2017 1324   CL 105 11/04/2017 1324   CO2 25 11/04/2017 1324   GLUCOSE 244 (H) 11/04/2017 1324   BUN 14 11/04/2017 1324   CREATININE 0.76 11/04/2017 1324   CALCIUM 9.5 11/04/2017 1324   GFRNONAA >60 11/04/2017 1324   GFRAA >60 11/04/2017 1324    Lipid Panel     Component Value Date/Time   CHOL 133 12/24/2017 0833   TRIG 62.0 12/24/2017 0833   HDL 45.40 12/24/2017 0833   CHOLHDL 3 12/24/2017 0833   VLDL 12.4 12/24/2017 0833   LDLCALC 75 12/24/2017 0833    CBC    Component Value Date/Time   WBC 8.8 11/04/2017 1324   RBC 3.72 (L) 11/04/2017 1324   HGB 11.8 (L) 11/04/2017 1324   HCT 35.1 11/04/2017 1324   PLT 211 11/04/2017 1324   MCV 94.4 11/04/2017 1324   MCH 31.7 11/04/2017 1324   MCHC 33.6 11/04/2017 1324   RDW 12.6 11/04/2017 1324   LYMPHSABS 2.8 11/04/2017 1324   MONOABS 0.6 11/04/2017 1324   EOSABS 0.1 11/04/2017 1324   BASOSABS 0.0 11/04/2017 1324    Hgb A1C Lab Results  Component Value Date   HGBA1C 7.2 (H) 11/11/2017           Assessment & Plan:   Herpes Zoster:  eRx for Valtrex 1 gm TID x 7  Days eRx for Neurontin 100 mg TID x 10 days Discussed risk for postherpetic neuralgia  Return precautions discussed Webb Silversmith, NP

## 2018-01-27 NOTE — Patient Instructions (Signed)
Shingles Shingles, which is also known as herpes zoster, is an infection that causes a painful skin rash and fluid-filled blisters. Shingles is not related to genital herpes, which is a sexually transmitted infection. Shingles only develops in people who:  Have had chickenpox.  Have received the chickenpox vaccine. (This is rare.)  What are the causes? Shingles is caused by varicella-zoster virus (VZV). This is the same virus that causes chickenpox. After exposure to VZV, the virus stays in the body in an inactive (dormant) state. Shingles develops if the virus reactivates. This can happen many years after the initial exposure to VZV. It is not known what causes this virus to reactivate. What increases the risk? People who have had chickenpox or received the chickenpox vaccine are at risk for shingles. Infection is more common in people who:  Are older than age 50.  Have a weakened defense (immune) system, such as those with HIV, AIDS, or cancer.  Are taking medicines that weaken the immune system, such as transplant medicines.  Are under great stress.  What are the signs or symptoms? Early symptoms of this condition include itching, tingling, and pain in an area on your skin. Pain may be described as burning, stabbing, or throbbing. A few days or weeks after symptoms start, a painful red rash appears, usually on one side of the body in a bandlike or beltlike pattern. The rash eventually turns into fluid-filled blisters that break open, scab over, and dry up in about 2-3 weeks. At any time during the infection, you may also develop:  A fever.  Chills.  A headache.  An upset stomach.  How is this diagnosed? This condition is diagnosed with a skin exam. Sometimes, skin or fluid samples are taken from the blisters before a diagnosis is made. These samples are examined under a microscope or sent to a lab for testing. How is this treated? There is no specific cure for this condition.  Your health care provider will probably prescribe medicines to help you manage pain, recover more quickly, and avoid long-term problems. Medicines may include:  Antiviral drugs.  Anti-inflammatory drugs.  Pain medicines.  If the area involved is on your face, you may be referred to a specialist, such as an eye doctor (ophthalmologist) or an ear, nose, and throat (ENT) doctor to help you avoid eye problems, chronic pain, or disability. Follow these instructions at home: Medicines  Take medicines only as directed by your health care provider.  Apply an anti-itch or numbing cream to the affected area as directed by your health care provider. Blister and Rash Care  Take a cool bath or apply cool compresses to the area of the rash or blisters as directed by your health care provider. This may help with pain and itching.  Keep your rash covered with a loose bandage (dressing). Wear loose-fitting clothing to help ease the pain of material rubbing against the rash.  Keep your rash and blisters clean with mild soap and cool water or as directed by your health care provider.  Check your rash every day for signs of infection. These include redness, swelling, and pain that lasts or increases.  Do not pick your blisters.  Do not scratch your rash. General instructions  Rest as directed by your health care provider.  Keep all follow-up visits as directed by your health care provider. This is important.  Until your blisters scab over, your infection can cause chickenpox in people who have never had it or been vaccinated   against it. To prevent this from happening, avoid contact with other people, especially: ? Babies. ? Pregnant women. ? Children who have eczema. ? Elderly people who have transplants. ? People who have chronic illnesses, such as leukemia or AIDS. Contact a health care provider if:  Your pain is not relieved with prescribed medicines.  Your pain does not get better after  the rash heals.  Your rash looks infected. Signs of infection include redness, swelling, and pain that lasts or increases. Get help right away if:  The rash is on your face or nose.  You have facial pain, pain around your eye area, or loss of feeling on one side of your face.  You have ear pain or you have ringing in your ear.  You have loss of taste.  Your condition gets worse. This information is not intended to replace advice given to you by your health care provider. Make sure you discuss any questions you have with your health care provider. Document Released: 04/06/2005 Document Revised: 12/01/2015 Document Reviewed: 02/15/2014 Elsevier Interactive Patient Education  2018 Elsevier Inc.  

## 2018-02-07 ENCOUNTER — Other Ambulatory Visit: Payer: Self-pay | Admitting: Primary Care

## 2018-02-07 DIAGNOSIS — I10 Essential (primary) hypertension: Secondary | ICD-10-CM

## 2018-05-05 ENCOUNTER — Encounter (INDEPENDENT_AMBULATORY_CARE_PROVIDER_SITE_OTHER): Payer: Self-pay

## 2018-05-05 ENCOUNTER — Inpatient Hospital Stay: Payer: Medicare HMO | Attending: Oncology | Admitting: Oncology

## 2018-05-05 ENCOUNTER — Encounter: Payer: Self-pay | Admitting: Oncology

## 2018-05-05 VITALS — BP 126/78 | HR 76 | Temp 98.2°F | Resp 18 | Ht 62.0 in | Wt 212.8 lb

## 2018-05-05 DIAGNOSIS — Z170 Estrogen receptor positive status [ER+]: Secondary | ICD-10-CM | POA: Diagnosis not present

## 2018-05-05 DIAGNOSIS — Z9011 Acquired absence of right breast and nipple: Secondary | ICD-10-CM | POA: Diagnosis not present

## 2018-05-05 DIAGNOSIS — Z853 Personal history of malignant neoplasm of breast: Secondary | ICD-10-CM

## 2018-05-05 DIAGNOSIS — Z79811 Long term (current) use of aromatase inhibitors: Secondary | ICD-10-CM | POA: Insufficient documentation

## 2018-05-05 DIAGNOSIS — Z79899 Other long term (current) drug therapy: Secondary | ICD-10-CM | POA: Insufficient documentation

## 2018-05-05 DIAGNOSIS — C50211 Malignant neoplasm of upper-inner quadrant of right female breast: Secondary | ICD-10-CM | POA: Diagnosis not present

## 2018-05-05 DIAGNOSIS — Z08 Encounter for follow-up examination after completed treatment for malignant neoplasm: Secondary | ICD-10-CM

## 2018-05-05 DIAGNOSIS — Z5181 Encounter for therapeutic drug level monitoring: Secondary | ICD-10-CM

## 2018-05-06 NOTE — Progress Notes (Signed)
Hematology/Oncology Consult note [facility]  [phone] Fax:([phone]  Patient Care Team: Pleas Koch, NP as PCP - General (Internal Medicine)   Name of the patient: Monica Villanueva  [id number]  Oct 04, 1945   Date of visit: 05/06/18  Diagnosis- 2 foci of invasive breast cancer in the right breast Stage I pT1c pn1(mi)sn cM0 ER/PR positive and Her2 negative s/p mastectomy    Chief complaint/ Reason for visit- routine f/u of breast cancer  Heme/Onc history: 1.Patient is a 73 year old female who underwent bilateral screening mammogram which showed masses in bilateral breasts requiring further evaluation.  2. Diagnostic bilateral mammogram on 07/10/2016 showed:IMPRESSION:1. Findings suspicious for multicentric carcinoma in the right breast. There is a suspicious irregular mass in the upper inner quadrant at 2 o'clock position 7 cm from the nipple and there are extensive pleomorphic calcifications in the outer right breast, involving both the lower outer quadrant and upper outer quadrant. 2. Probable fibroadenoma 10:30 position upper-outer quadrant right breast. 3. Probable complicated cyst 3 o'clock retroareolar left breast.4. Probable sebaceous cyst left axilla.  3. Patient underwent core biopsy of both the right breast lesions as well as ultrasound-guided aspiration of left breast cyst. Pathology showed invasive lobular carcinoma and invasive mammary carcinoma in 2 separate locations ER PR positive Her 2 neu negative   4. At baseline patient is in good health and reports no significant comorbidities. She does not take any other medications except multivitamins. No family history of breast cancer. Menarche at the age of 89 and menopause in 89. She did not breast-feed and used oral contraceptive pills remotely. She is G2 P2 L2.  5. Patient underwent right simple mastectomy with SLNB on 4/2/418. Pathology showed:  DIAGNOSIS:  A.  SENTINEL LYMPH NODE; EXCISION:  - ONE LYMPH NODE NEGATIVE FOR MALIGNANCY (0/1).   B. BREAST; SIMPLE MASTECTOMY:  - INVASIVE MAMMARY CARCINOMA, NO SPECIAL TYPE (2:00 LOCATION).  - INVASIVE LOBULAR CARCINOMA ARISING IN AREA OF EXTENSIVE DUCTAL  CARCINOMA IN SITU WITH ADJACENT LOBULAR CARCINOMA IN SITU (6:00  LOCATION).  - MICRO METASTATIC CARCINOMA INVOLVES ONE OF THREE LYMPH NODES (1/3).  - SEE CANCER SUMMARY BELOW.  - FIBROADENOMA.  - NIPPLE WITH INTRADUCTAL PAPILLOMA.  - TWO SEPARATE BIOPSY SITES WITH METALLIC MARKERS.  - SKIN WITH KELOID.   Surgical Pathology Cancer Case Summary   INVASIVE CARCINOMA OF THE BREAST  Procedure: Simple mastectomy  Specimen Laterality: Right  Histologic Type: Invasive mammary carcinoma of no special type  invasive lobular carcinoma  Histologic Grade (Nottingham Histologic Score)       Glandular (Acinar)/Tubular Differentiation: 3       Nuclear Pleomorphism: 2       Mitotic Rate: 1       Overall Grade: 2  Tumor Size: 15 mm (invasive mammary no special type); 4 mm (invasive  lobular)  Ductal Carcinoma In Situ (DCIS): Present  Margins:    Invasive carcinoma: Negative for invasive carcinoma       Distance from closest margin: 17 mm to the deep    Ductal carcinoma in situ: Negative for invasive carcinoma       Distance from closest margin: 17 mm deep  Regional Lymph nodes:    Total # lymph nodes examined: 4    # Sentinel lymph nodes examined: 1    # Lymph nodes with macrometastasis (>2.0 mm): 0    # Lymph nodes with isolated tumor cells (<0.2 mm): 0    # Lymph nodes with micrometastasis (>  0.2 mm and <2.0 mm): 1    Extranodal extension: Notidentified  Treatment Effect: No known pre-surgical therapy  Lymphovascular Invasion: Not identified  Pathologic Stage Classification (pTNM, AJCC 8th Edition): pT1c pN85m  (sn)  TNM Descriptors: (m) multifocal    BREAST BIOMARKER TESTS - performed on  prior biopsy of invasive mammary  carcinoma no special type (2:00 lesion)  Estrogen Receptor (ER) Status: POSITIVE, >90% nuclear staining  Progesterone Receptor (PgR) Status: POSITIVE, >90% nuclear staining  HER2 (by immunohistochemistry): Equivocal (Score 2+)  Percentage of cells with uniform intense complete membrane staining: 5%  HER2 (ERBB2) (by in situ hybridization): NEGATIVE   6. mammaprint came back as low risk and she did not require adjuvant chemotherapy. Started on arimidex on 09/03/16. Baseline bone density scan showed osteopenia    Interval history-she is currently on Arimidex with calcium and vitamin D which she is tolerating well without any significant side effects.  Reports that her appetite is good and her weight has remained stable.  She reports occasional pain at the site of her mastectomy.  ECOG PS- 1 Pain scale- 3   Review of systems- Review of Systems  Constitutional: Positive for malaise/fatigue. Negative for chills, fever and weight loss.  HENT: Negative for congestion, ear discharge and nosebleeds.   Eyes: Negative for blurred vision.  Respiratory: Negative for cough, hemoptysis, sputum production, shortness of breath and wheezing.   Cardiovascular: Negative for chest pain, palpitations, orthopnea and claudication.  Gastrointestinal: Negative for abdominal pain, blood in stool, constipation, diarrhea, heartburn, melena, nausea and vomiting.  Genitourinary: Negative for dysuria, flank pain, frequency, hematuria and urgency.  Musculoskeletal: Negative for back pain, joint pain and myalgias.  Skin: Negative for rash.  Neurological: Negative for dizziness, tingling, focal weakness, seizures, weakness and headaches.  Endo/Heme/Allergies: Does not bruise/bleed easily.  Psychiatric/Behavioral: Negative for depression and suicidal ideas. The patient does not have insomnia.        No Known Allergies   Past Medical History:  Diagnosis Date  . [place]  [place]   . [place] ([place]   . GERD (gastroesophageal reflux disease)    OCC  . Keloid of skin    right axilla  . Shingles 01/23/2019     Past Surgical History:  Procedure Laterality Date  . ABDOMINAL HYSTERECTOMY  1996  . BREAST BIOPSY Right 07/15/2016   stereo bx of calcs LOQ-path pending  . BREAST BIOPSY Right 07/15/2016   uKoreabx of mass-path pending  . BREAST CYST ASPIRATION Left 07/15/2016  . COLONOSCOPY    . SENTINEL NODE BIOPSY Right 08/11/2016   Procedure: SENTINEL NODE BIOPSY;  Surgeon: JLeonie Green MD;  Location: [facility] ORS;  Service: General;  Laterality: Right;  . SIMPLE MASTECTOMY WITH AXILLARY SENTINEL NODE BIOPSY Right 08/11/2016   Procedure: SIMPLE MASTECTOMY;  Surgeon: JLeonie Green MD;  Location: [facility] ORS;  Service: General;  Laterality: Right;    Social History   Socioeconomic History  . Marital status: Widowed    Spouse name: Not on file  . Number of children: Not on file  . Years of education: Not on file  . Highest education level: Not on file  Occupational History  . Not on file  Social Needs  . Financial resource strain: Not on file  . Food insecurity:    Worry: Not on file    Inability: Not on file  . Transportation needs:    Medical: Not on file    Non-medical: Not on file  Tobacco Use  .  Smoking status: Never Smoker  . Smokeless tobacco: Never Used  Substance and Sexual Activity  . Alcohol use: Yes    Comment: rare use  . Drug use: No  . Sexual activity: Not Currently  Lifestyle  . Physical activity:    Days per week: Not on file    Minutes per session: Not on file  . Stress: Not on file  Relationships  . Social connections:    Talks on phone: Not on file    Gets together: Not on file    Attends religious service: Not on file    Active member of club or organization: Not on file    Attends meetings of clubs or organizations: Not on file    Relationship status: Not on file  . Intimate partner violence:    Fear of current  or ex partner: Not on file    Emotionally abused: Not on file    Physically abused: Not on file    Forced sexual activity: Not on file  Other Topics Concern  . Not on file  Social History Narrative   Widowed.   2 children, 4 grandchildren.   Retired. Previously worked at [employer].   Enjoys writing, playing on the key boards.    Family History  Problem Relation Age of Onset  . Hypertension Mother   . Hypertension Sister   . Breast cancer Neg Hx      Current Outpatient Medications:  .  ACCU-CHEK FASTCLIX LANCETS MISC, USE TO CHECK BLOOD GLUCOSE UP TO 4 TIMES DAILY, Disp: 300 each, Rfl: 1 .  ACCU-CHEK GUIDE test strip, USE TO CHECK BLOOD GLUCOSE UP TO 4 TIMES DAILY, Disp: 100 each, Rfl: 5 .  amLODipine (NORVASC) 10 MG tablet, TAKE 1 TABLET BY MOUTH EVERY DAY, Disp: 90 tablet, Rfl: 1 .  anastrozole (ARIMIDEX) 1 MG tablet, Take 1 tablet (1 mg total) by mouth daily., Disp: 90 tablet, Rfl: 3 .  atorvastatin (LIPITOR) 20 MG tablet, Take 1 tablet by mouth at bedtime for cholesterol., Disp: 90 tablet, Rfl: 3 .  blood glucose meter kit and supplies KIT, Dispense based on patient and insurance preference. Use up to four times daily as directed. (FOR ICD-9 250.00, 250.01)., Disp: 1 each, Rfl: 0 .  calcium carbonate (TUMS - DOSED IN MG ELEMENTAL CALCIUM) 500 MG chewable tablet, Chew 1 tablet by mouth as needed for indigestion or heartburn., Disp: , Rfl:  .  Calcium-Magnesium-Vitamin D (CALCIUM 1200+D3 PO), Take 2 tablets by mouth daily. Calcium 1200 units / Vit D 800 mg, Disp: , Rfl:  .  COLLAGEN PO, Take 1 capsule by mouth daily. , Disp: , Rfl:  .  Krill Oil (OMEGA-3) 500 MG CAPS, Take 1 capsule by mouth daily., Disp: , Rfl:  .  Multiple Vitamins-Minerals (HAIR SKIN AND NAILS FORMULA) TABS, Take by mouth., Disp: , Rfl:   Physical exam:  Vitals:   05/05/18 1527  BP: 126/78  Pulse: 76  Resp: 18  Temp: 98.2 F (36.8 C)  TempSrc: Tympanic  Weight: 212 lb 12.8 oz (96.5  kg)  Height: _0  (1.575 m)   Physical Exam Constitutional:      General: She is not in acute distress. HENT:     Head: Normocephalic and atraumatic.  Eyes:     Pupils: Pupils are equal, round, and reactive to light.  Neck:     Musculoskeletal: Normal range of motion.  Cardiovascular:     Rate and Rhythm: Normal rate and regular rhythm.  Heart sounds: Normal heart sounds.  Pulmonary:     Effort: Pulmonary effort is normal.     Breath sounds: Normal breath sounds.  Abdominal:     General: Bowel sounds are normal.     Palpations: Abdomen is soft.  Skin:    General: Skin is warm and dry.  Neurological:     Mental Status: She is alert and oriented to person, place, and time.      Breast:  Patient is status post right mastectomy without reconstruction. scar is well opposed with no evidence of chest wall recurrence. no palpable adenopathy  CMP Latest Ref Rng & Units 12/24/2017  Glucose 70 - 99 mg/dL -  BUN 8 - 23 mg/dL -  Creatinine 0.44 - 1.00 mg/dL -  Sodium 135 - 145 mmol/L -  Potassium 3.5 - 5.1 mmol/L -  Chloride 98 - 111 mmol/L -  CO2 22 - 32 mmol/L -  Calcium 8.9 - 10.3 mg/dL -  Total Protein 6.0 - 8.3 g/dL 7.7  Total Bilirubin 0.2 - 1.2 mg/dL 0.6  Alkaline Phos 39 - 117 U/L 94  AST 0 - 37 U/L 16  ALT 0 - 35 U/L 13   CBC Latest Ref Rng & Units 11/04/2017  WBC 3.6 - 11.0 K/uL 8.8  Hemoglobin 12.0 - 16.0 g/dL 11.8(L)  Hematocrit 35.0 - 47.0 % 35.1  Platelets 150 - 440 K/uL 211      Assessment and plan- Patient is a 73 y.o. female invasive breast cancer in the right breast Stage I pT1c pn1(mi)sn cM0 ER/PR positive and Her2 negativestatus post right mastectomy and currently on Arimidex.she is here for routine f/u of breast cancer  Clinically patient is doing well and there is no evidence of recurrence on today's exam.  She will continue Arimidex along with calcium and vitamin D for 5 years.  She is due for left breast mammogram in March 2020 which will be  coordinated by Dr. Deniece Ree office.  I will see her back in 6 months time.  She did have some baseline osteopenia and we will repeat her bone density scan in March 2020   Visit Diagnosis 1. Visit for monitoring Arimidex therapy   2. Encounter for follow-up surveillance of breast cancer      Dr. Randa Evens, MD, MPH [facility] [phone] 05/06/2018 12:49 PM

## 2018-05-22 ENCOUNTER — Other Ambulatory Visit: Payer: Self-pay | Admitting: Primary Care

## 2018-05-22 DIAGNOSIS — E119 Type 2 diabetes mellitus without complications: Secondary | ICD-10-CM

## 2018-05-22 DIAGNOSIS — I10 Essential (primary) hypertension: Secondary | ICD-10-CM

## 2018-05-25 ENCOUNTER — Ambulatory Visit: Payer: Medicare HMO

## 2018-05-26 ENCOUNTER — Other Ambulatory Visit: Payer: Self-pay | Admitting: General Surgery

## 2018-05-26 DIAGNOSIS — Z1231 Encounter for screening mammogram for malignant neoplasm of breast: Secondary | ICD-10-CM

## 2018-05-30 ENCOUNTER — Ambulatory Visit (INDEPENDENT_AMBULATORY_CARE_PROVIDER_SITE_OTHER): Payer: Medicare HMO

## 2018-05-30 VITALS — BP 144/82 | HR 70 | Temp 98.2°F | Ht 63.5 in | Wt 214.5 lb

## 2018-05-30 DIAGNOSIS — I10 Essential (primary) hypertension: Secondary | ICD-10-CM

## 2018-05-30 DIAGNOSIS — Z0000 Encounter for general adult medical examination without abnormal findings: Secondary | ICD-10-CM

## 2018-05-30 DIAGNOSIS — E119 Type 2 diabetes mellitus without complications: Secondary | ICD-10-CM

## 2018-05-30 DIAGNOSIS — Z23 Encounter for immunization: Secondary | ICD-10-CM | POA: Diagnosis not present

## 2018-05-30 LAB — MICROALBUMIN / CREATININE URINE RATIO
Creatinine,U: 151.3 mg/dL
Microalb Creat Ratio: 0.8 mg/g (ref 0.0–30.0)
Microalb, Ur: 1.2 mg/dL (ref 0.0–1.9)

## 2018-05-30 LAB — BASIC METABOLIC PANEL
BUN: 13 mg/dL (ref 6–23)
CO2: 29 mEq/L (ref 19–32)
Calcium: 9.8 mg/dL (ref 8.4–10.5)
Chloride: 102 mEq/L (ref 96–112)
Creatinine, Ser: 0.75 mg/dL (ref 0.40–1.20)
GFR: 91.76 mL/min (ref >60.00)
GLUCOSE: 141 mg/dL — AB (ref 70–99)
Potassium: 3.9 mEq/L (ref 3.5–5.1)
Sodium: 139 mEq/L (ref 135–145)

## 2018-05-30 LAB — HEMOGLOBIN A1C: Hgb A1c MFr Bld: 6.6 % — ABNORMAL HIGH (ref 4.6–6.5)

## 2018-05-30 NOTE — Progress Notes (Signed)
PCP notes:   Health maintenance:  Foot exam - PCP follow-up requested A1C - completed Microalbumin - completed PPSV23 - administered Flu vaccine - administered  Abnormal screenings:   None  Patient concerns:   None  Nurse concerns:  None  Next PCP appt:   06/01/18 @ 5830

## 2018-05-30 NOTE — Patient Instructions (Signed)
Monica Villanueva , Thank you for taking time to come for your Medicare Wellness Visit. I appreciate your ongoing commitment to your health goals. Please review the following plan we discussed and let me know if I can assist you in the future.   These are the goals we discussed: Goals    . Increase physical activity     Starting 05/30/2018, I will continue to exercise for at least 60 minutes daily.        This is a list of the screening recommended for you and due dates:  Health Maintenance  Topic Date Due  . Complete foot exam   06/01/2018*  . Tetanus Vaccine  04/19/2020*  . Eye exam for diabetics  08/14/2018  . Hemoglobin A1C  11/28/2018  . Cologuard (Stool DNA test)  05/20/2019  . Urine Protein Check  05/31/2019  . Mammogram  07/13/2019  . Flu Shot  Completed  . DEXA scan (bone density measurement)  Completed  .  Hepatitis C: One time screening is recommended by Center for Disease Control  (CDC) for  adults born from [birth year] through 1965.   Completed  . Pneumonia vaccines  Completed  *Topic was postponed. The date shown is not the original due date.   Preventive Care for Adults  A healthy lifestyle and preventive care can promote health and wellness. Preventive health guidelines for adults include the following key practices.  . A routine yearly physical is a good way to check with your health care provider about your health and preventive screening. It is a chance to share any concerns and updates on your health and to receive a thorough exam.  . Visit your dentist for a routine exam and preventive care every 6 months. Brush your teeth twice a day and floss once a day. Good oral hygiene prevents tooth decay and gum disease.  . The frequency of eye exams is based on your age, health, family medical history, use  of contact lenses, and other factors. Follow your health care provider's recommendations for frequency of eye exams.  . Eat a healthy diet. Foods like vegetables, fruits, whole  grains, low-fat dairy products, and lean protein foods contain the nutrients you need without too many calories. Decrease your intake of foods high in solid fats, added sugars, and salt. Eat the right amount of calories for you. Get information about a proper diet from your health care provider, if necessary.  . Regular physical exercise is one of the most important things you can do for your health. Most adults should get at least 150 minutes of moderate-intensity exercise (any activity that increases your heart rate and causes you to sweat) each week. In addition, most adults need muscle-strengthening exercises on 2 or more days a week.  Silver Sneakers may be a benefit available to you. To determine eligibility, you may visit the website: www.silversneakers.com or contact program at (930)082-4156 Mon-Fri between 8AM-8PM.   . Maintain a healthy weight. The body mass index (BMI) is a screening tool to identify possible weight problems. It provides an estimate of body fat based on height and weight. Your health care provider can find your BMI and can help you achieve or maintain a healthy weight.   For adults 20 years and older: ? A BMI below 18.5 is considered underweight. ? A BMI of 18.5 to 24.9 is normal. ? A BMI of 25 to 29.9 is considered overweight. ? A BMI of 30 and above is considered obese.   . Maintain  normal blood lipids and cholesterol levels by exercising and minimizing your intake of saturated fat. Eat a balanced diet with plenty of fruit and vegetables. Blood tests for lipids and cholesterol should begin at age 4 and be repeated every 5 years. If your lipid or cholesterol levels are high, you are over 50, or you are at high risk for heart disease, you may need your cholesterol levels checked more frequently. Ongoing high lipid and cholesterol levels should be treated with medicines if diet and exercise are not working.  . If you smoke, find out from your health care provider how to  quit. If you do not use tobacco, please do not start.  . If you choose to drink alcohol, please do not consume more than 2 drinks per day. One drink is considered to be 12 ounces (355 mL) of beer, 5 ounces (148 mL) of wine, or 1.5 ounces (44 mL) of liquor.  . If you are 14-82 years old, ask your health care provider if you should take aspirin to prevent strokes.  . Use sunscreen. Apply sunscreen liberally and repeatedly throughout the day. You should seek shade when your shadow is shorter than you. Protect yourself by wearing long sleeves, pants, a wide-brimmed hat, and sunglasses year round, whenever you are outdoors.  . Once a month, do a whole body skin exam, using a mirror to look at the skin on your back. Tell your health care provider of new moles, moles that have irregular borders, moles that are larger than a pencil eraser, or moles that have changed in shape or color.

## 2018-05-30 NOTE — Progress Notes (Signed)
Subjective:   Monica Villanueva is a 73 y.o. female who presents for Medicare Annual (Subsequent) preventive examination.  Review of Systems:  N/A Cardiac Risk Factors include: advanced age (>8mn, >>40women);diabetes mellitus;obesity (BMI >30kg/m2);hypertension     Objective:     Vitals: BP (!) 144/82 (BP Location: Left Arm, Patient Position: Sitting, Cuff Size: Normal)   Pulse 70   Temp 98.2 F (36.8 C) (Oral)   Ht 5' 3.5" (1.613 m) Comment: shoes  Wt 214 lb 8 oz (97.3 kg)   SpO2 96%   BMI 37.40 kg/m   Body mass index is 37.4 kg/m.  Advanced Directives 05/30/2018 05/05/2018 11/04/2017 05/14/2017 01/07/2017 10/08/2016 08/18/2016  Does Patient Have a Medical Advance Directive? _0  No No  Would patient like information on creating a medical advance directive? No - Patient declined No - Patient declined Yes (MAU/Ambulatory/Procedural Areas - Information given) No - Patient declined No - Patient declined - -    Tobacco Social History   Tobacco Use  Smoking Status Never Smoker  Smokeless Tobacco Never Used     Counseling given: No   Clinical Intake:  Pre-visit preparation completed: Yes  Pain : 0-10 Pain Score: 5  Pain Type: Acute pain Pain Location: Finger (Comment which one) Pain Orientation: Left     Nutritional Status: BMI > 30  Obese Nutritional Risks: None Diabetes: Yes CBG done?: No Did pt. bring in CBG monitor from home?: No  How often do you need to have someone help you when you read instructions, pamphlets, or other written materials from your doctor or pharmacy?: 1 - Never What is the last grade level you completed in school?: 12th grade  Interpreter Needed?: No  Comments: pt lives with daughter and son-in-law Information entered by :: LPinson, LPN  Past Medical History:  Diagnosis Date  . [place]   . [place]   . GERD (gastroesophageal reflux disease)    OCC  . Keloid of skin    right axilla  . Shingles 01/23/2019    Past Surgical History:  Procedure Laterality Date  . ABDOMINAL HYSTERECTOMY  1996  . BREAST BIOPSY Right 07/15/2016   stereo bx of calcs LOQ-path pending  . BREAST BIOPSY Right 07/15/2016   uKoreabx of mass-path pending  . BREAST CYST ASPIRATION Left 07/15/2016  . COLONOSCOPY    . SENTINEL NODE BIOPSY Right 08/11/2016   Procedure: SENTINEL NODE BIOPSY;  Surgeon: JLeonie Green MD;  Location: [facility] ORS;  Service: General;  Laterality: Right;  . SIMPLE MASTECTOMY WITH AXILLARY SENTINEL NODE BIOPSY Right 08/11/2016   Procedure: SIMPLE MASTECTOMY;  Surgeon: JLeonie Green MD;  Location: [facility] ORS;  Service: General;  Laterality: Right;   Family History  Problem Relation Age of Onset  . Hypertension Mother   . Hypertension Sister   . Breast cancer Neg Hx    Social History   Socioeconomic History  . Marital status: Widowed    Spouse name: Not on file  . Number of children: Not on file  . Years of education: Not on file  . Highest education level: Not on file  Occupational History  . Not on file  Social Needs  . Financial resource strain: Not on file  . Food insecurity:    Worry: Not on file    Inability: Not on file  . Transportation needs:    Medical: Not on file    Non-medical: Not on file  Tobacco Use  . Smoking  status: Never Smoker  . Smokeless tobacco: Never Used  Substance and Sexual Activity  . Alcohol use: Yes    Comment: rare use  . Drug use: No  . Sexual activity: Not Currently  Lifestyle  . Physical activity:    Days per week: Not on file    Minutes per session: Not on file  . Stress: Not on file  Relationships  . Social connections:    Talks on phone: Not on file    Gets together: Not on file    Attends religious service: Not on file    Active member of club or organization: Not on file    Attends meetings of clubs or organizations: Not on file    Relationship status: Not on file  Other Topics Concern  . Not on file  Social History Narrative    Widowed.   2 children, 4 grandchildren.   Retired. Previously worked at [employer].   Enjoys writing, playing on the key boards.    Outpatient Encounter Medications as of 05/30/2018  Medication Sig  . ACCU-CHEK FASTCLIX LANCETS MISC USE TO CHECK BLOOD GLUCOSE UP TO 4 TIMES DAILY  . ACCU-CHEK GUIDE test strip USE TO CHECK BLOOD GLUCOSE UP TO 4 TIMES DAILY  . amLODipine (NORVASC) 10 MG tablet TAKE 1 TABLET BY MOUTH EVERY DAY  . anastrozole (ARIMIDEX) 1 MG tablet Take 1 tablet (1 mg total) by mouth daily.  Marland Kitchen atorvastatin (LIPITOR) 20 MG tablet Take 1 tablet by mouth at bedtime for cholesterol.  . blood glucose meter kit and supplies KIT Dispense based on patient and insurance preference. Use up to four times daily as directed. (FOR ICD-9 250.00, 250.01).  . calcium carbonate (TUMS - DOSED IN MG ELEMENTAL CALCIUM) 500 MG chewable tablet Chew 1 tablet by mouth as needed for indigestion or heartburn.  . Calcium-Magnesium-Vitamin D (CALCIUM 1200+D3 PO) Take 2 tablets by mouth daily. Calcium 1200 units / Vit D 800 mg  . COLLAGEN PO Take 1 capsule by mouth daily.   Javier Docker Oil (OMEGA-3) 500 MG CAPS Take 1 capsule by mouth daily.  . Multiple Vitamins-Minerals (HAIR SKIN AND NAILS FORMULA) TABS Take by mouth.   No facility-administered encounter medications on file as of 05/30/2018.     Activities of Daily Living In your present state of health, do you have any difficulty performing the following activities: 05/30/2018  Hearing? N  Vision? N  Difficulty concentrating or making decisions? N  Walking or climbing stairs? N  Dressing or bathing? N  Doing errands, shopping? N  Preparing Food and eating ? N  Using the Toilet? N  In the past six months, have you accidently leaked urine? N  Do you have problems with loss of bowel control? N  Managing your Medications? N  Managing your Finances? N  Housekeeping or managing your Housekeeping? N  Some recent data might be hidden     Patient Care Team: Pleas Koch, NP as PCP - General (Internal Medicine)    Assessment:   This is a routine wellness examination for [name].   Hearing Screening   _0  _1  _2  _3  _4  _5  _6  _7  _8   Right ear:   40 40 40  40    Left ear:   40 40 40  40    Vision Screening Comments: Vision exam in April 2019 with Dr. Alroy Dust  Exercise Activities and Dietary recommendations Current Exercise Habits: Home exercise routine, Type of exercise: walking;stretching, Time (Minutes): 60, Frequency (  Times/Week): 7, Weekly Exercise (Minutes/Week): 420, Intensity: Mild, Exercise limited by: None identified  Goals    . Increase physical activity     Starting 05/30/2018, I will continue to exercise for at least 60 minutes daily.        Fall Risk Fall Risk  05/30/2018 05/14/2017  Falls in the past year? 0 No   Depression Screen PHQ 2/9 Scores 05/30/2018 05/14/2017 05/13/2016  PHQ - 2 Score 0 0 0  PHQ- 9 Score 0 0 -     Cognitive Function MMSE - Mini Mental State Exam 05/30/2018 05/14/2017  Orientation to time 5 5  Orientation to Place 5 5  Registration 3 3  Attention/ Calculation 0 0  Recall 3 3  Language- name 2 objects 0 0  Language- repeat 1 1  Language- follow 3 step command 3 3  Language- read & follow direction 0 0  Write a sentence 0 0  Copy design 0 0  Total score 20 20     PLEASE NOTE: A Mini-Cog screen was completed. Maximum score is 20. A value of 0 denotes this part of [place] MMSE was not completed or the patient failed this part of the Mini-Cog screening.   Mini-Cog Screening Orientation to Time - Max 5 pts Orientation to Place - Max 5 pts Registration - Max 3 pts Recall - Max 3 pts Language Repeat - Max 1 pts Language Follow 3 Step Command - Max 3 pts     Immunization History  Administered Date(s) Administered  . Influenza,inj,Quad PF,6+ Mos 05/13/2016, 05/14/2017, 05/30/2018  . Pneumococcal Conjugate-13 05/14/2017  .  Pneumococcal Polysaccharide-23 05/30/2018    Screening Tests Health Maintenance  Topic Date Due  . FOOT EXAM  06/01/2018 (Originally 05/14/2018)  . TETANUS/TDAP  04/19/2020 (Originally 10/24/1964)  . OPHTHALMOLOGY EXAM  08/14/2018  . HEMOGLOBIN A1C  11/28/2018  . Fecal DNA (Cologuard)  05/20/2019  . URINE MICROALBUMIN  05/31/2019  . MAMMOGRAM  07/13/2019  . INFLUENZA VACCINE  Completed  . DEXA SCAN  Completed  . Hepatitis C Screening  Completed  . PNA vac Low Risk Adult  Completed       Plan:     I have personally reviewed, addressed, and noted the following in the patient's chart:  A. Medical and social history B. Use of alcohol, tobacco or illicit drugs  C. Current medications and supplements D. Functional ability and status E.  Nutritional status F.  Physical activity G. Advance directives H. List of other physicians I.  Hospitalizations, surgeries, and ER visits in previous 12 months J.  [place] to include hearing, vision, cognitive, depression L. Referrals and appointments - none  In addition, I have reviewed and discussed with patient certain preventive protocols, quality metrics, and best practice recommendations. A written personalized care plan for preventive services as well as general preventive health recommendations were provided to patient.  See attached scanned questionnaire for additional information.   Signed,   Lindell Noe, MHA, BS, LPN Health Coach

## 2018-05-31 NOTE — Progress Notes (Signed)
I reviewed health advisor's note, was available for consultation, and agree with documentation and plan.  

## 2018-06-01 ENCOUNTER — Ambulatory Visit (INDEPENDENT_AMBULATORY_CARE_PROVIDER_SITE_OTHER): Payer: Medicare HMO | Admitting: Primary Care

## 2018-06-01 ENCOUNTER — Encounter: Payer: Self-pay | Admitting: Primary Care

## 2018-06-01 VITALS — BP 140/80 | HR 71 | Temp 98.5°F | Ht 62.0 in | Wt 214.5 lb

## 2018-06-01 DIAGNOSIS — I10 Essential (primary) hypertension: Secondary | ICD-10-CM | POA: Diagnosis not present

## 2018-06-01 DIAGNOSIS — Z23 Encounter for immunization: Secondary | ICD-10-CM | POA: Diagnosis not present

## 2018-06-01 DIAGNOSIS — C50919 Malignant neoplasm of unspecified site of unspecified female breast: Secondary | ICD-10-CM | POA: Diagnosis not present

## 2018-06-01 DIAGNOSIS — M6530 Trigger finger, unspecified finger: Secondary | ICD-10-CM | POA: Insufficient documentation

## 2018-06-01 DIAGNOSIS — M65332 Trigger finger, left middle finger: Secondary | ICD-10-CM | POA: Diagnosis not present

## 2018-06-01 DIAGNOSIS — E119 Type 2 diabetes mellitus without complications: Secondary | ICD-10-CM

## 2018-06-01 DIAGNOSIS — E785 Hyperlipidemia, unspecified: Secondary | ICD-10-CM

## 2018-06-01 DIAGNOSIS — Z0000 Encounter for general adult medical examination without abnormal findings: Secondary | ICD-10-CM | POA: Insufficient documentation

## 2018-06-01 MED ORDER — ZOSTER VAC RECOMB ADJUVANTED 50 MCG/0.5ML IM SUSR: 0.5000 mL | Freq: Once | INTRAMUSCULAR | 1 refills | Status: AC

## 2018-06-01 NOTE — Assessment & Plan Note (Signed)
Following with oncology semi-annually. Compliant to anastrozole. Mammogram due in March 2020.

## 2018-06-01 NOTE — Assessment & Plan Note (Signed)
Stable on atorvastatin 20 mg, continue same.

## 2018-06-01 NOTE — Patient Instructions (Signed)
Take the shingles vaccination to your pharmacy for administration.  Complete your mammogram and bone density scan as scheduled.  Start exercising. You should be getting 150 minutes of exercise weekly.  Continue to work on a healthy diet.  Please schedule a follow up appointment in 6 months for diabetes check.   It was a pleasure to see you today!   Trigger Finger  Trigger finger (stenosing tenosynovitis) is a condition that causes a finger to get stuck in a bent position. Each finger has a tough, cord-like tissue that connects muscle to bone (tendon), and each tendon is surrounded by a tunnel of tissue (tendon sheath). To move your finger, your tendon needs to slide freely through the sheath. Trigger finger happens when the tendon or the sheath thickens, making it difficult to move your finger. Trigger finger can affect any finger or a thumb. It may affect more than one finger. Mild cases may clear up with rest and medicine. Severe cases require more treatment. What are the causes? Trigger finger is caused by a thickened finger tendon or tendon sheath. The cause of this thickening is not known. What increases the risk? The following factors may make you more likely to develop this condition:  Doing activities that require a strong grip.  Having rheumatoid arthritis, gout, or diabetes.  Being 73-1 years old.  Being a woman. What are the signs or symptoms? Symptoms of this condition include:  Pain when bending or straightening your finger.  Tenderness or swelling where your finger attaches to the palm of your hand.  A lump in the palm of your hand or on the inside of your finger.  Hearing a popping sound when you try to straighten your finger.  Feeling a popping, catching, or locking sensation when you try to straighten your finger.  Being unable to straighten your finger. How is this diagnosed? This condition is diagnosed based on your symptoms and a physical exam. How is  this treated? This condition may be treated by:  Resting your finger and avoiding activities that make symptoms worse.  Wearing a finger splint to keep your finger in a slightly bent position.  Taking NSAIDs to relieve pain and swelling.  Injecting medicine (steroids) into the tendon sheath to reduce swelling and irritation. Injections may need to be repeated.  Having surgery to open the tendon sheath. This may be done if other treatments do not work and you cannot straighten your finger. You may need physical therapy after surgery. Follow these instructions at home:   Use moist heat to help reduce pain and swelling as told by your health care provider.  Rest your finger and avoid activities that make pain worse. Return to normal activities as told by your health care provider.  If you have a splint, wear it as told by your health care provider.  Take over-the-counter and prescription medicines only as told by your health care provider.  Keep all follow-up visits as told by your health care provider. This is important. Contact a health care provider if:  Your symptoms are not improving with home care. Summary  Trigger finger (stenosing tenosynovitis) causes your finger to get stuck in a bent position, and it can make it difficult and painful to straighten your finger.  This condition develops when a finger tendon or tendon sheath thickens.  Treatment starts with resting, wearing a splint, and taking NSAIDs.  In severe cases, surgery to open the tendon sheath may be needed. This information is not intended  to replace advice given to you by your health care provider. Make sure you discuss any questions you have with your health care provider. Document Released: 01/25/2004 Document Revised: 03/17/2016 Document Reviewed: 03/17/2016 Elsevier Interactive Patient Education  2019 [employer].

## 2018-06-01 NOTE — Progress Notes (Signed)
Subjective:    Patient ID: Monica Villanueva, female    DOB: 1945-08-12, 73 y.o.   MRN: [id number]  HPI  Monica Villanueva is a 73 year old female who presents today for complete physical.  Immunizations: -Influenza: Completed this season  -Pneumonia: Completed regimen -Shingles: Never completed   Diet: She endorses a fair diet Breakfast: Boiled egg, toast with peanut butter, scrambled egg, fruit Lunch: Salad, vegetables Dinner: Sometimes skips, meat, sweet potatoes, vegetables  Snacks: None Desserts: Cookie, infrequently Beverages: Water, coffee  Exercise: She is walking some, some stretching  Eye exam: Completed in April 2019 Dental exam: No recent exam Colonoscopy: Completed in 2018, due in 2021 Dexa: Scheduled for March  Mammogram: Scheduled for March Hep C Screen: Completed  BP Readings from Last 3 Encounters:  06/01/18 140/80  05/30/18 (!) 144/82  05/05/18 126/78     Review of Systems  Constitutional: Negative for unexpected weight change.  HENT: Negative for rhinorrhea.   Respiratory: Negative for cough and shortness of breath.   Cardiovascular: Negative for chest pain.  Gastrointestinal: Negative for constipation and diarrhea.  Genitourinary: Negative for difficulty urinating.  Musculoskeletal: Positive for arthralgias. Negative for myalgias.       Left middle finger contracture intermittently since December 2019.  Skin: Negative for rash.  Allergic/Immunologic: Negative for environmental allergies.  Neurological: Negative for dizziness, numbness and headaches.  Psychiatric/Behavioral: The patient is not nervous/anxious.        Past Medical History:  Diagnosis Date  . Breast cancer ([place])   . Cancer ([place])   . GERD (gastroesophageal reflux disease)    OCC  . Keloid of skin    right axilla  . Shingles 01/23/2019     Social History   Socioeconomic History  . Marital status: Widowed    Spouse name: Not on file  . Number of children: Not on file  . Years of  education: Not on file  . Highest education level: Not on file  Occupational History  . Not on file  Social Needs  . Financial resource strain: Not on file  . Food insecurity:    Worry: Not on file    Inability: Not on file  . Transportation needs:    Medical: Not on file    Non-medical: Not on file  Tobacco Use  . Smoking status: Never Smoker  . Smokeless tobacco: Never Used  Substance and Sexual Activity  . Alcohol use: Yes    Comment: rare use  . Drug use: No  . Sexual activity: Not Currently  Lifestyle  . Physical activity:    Days per week: Not on file    Minutes per session: Not on file  . Stress: Not on file  Relationships  . Social connections:    Talks on phone: Not on file    Gets together: Not on file    Attends religious service: Not on file    Active member of club or organization: Not on file    Attends meetings of clubs or organizations: Not on file    Relationship status: Not on file  . Intimate partner violence:    Fear of current or ex partner: Not on file    Emotionally abused: Not on file    Physically abused: Not on file    Forced sexual activity: Not on file  Other Topics Concern  . Not on file  Social History Narrative   Widowed.   2 children, 4 grandchildren.   Retired. Previously worked at [employer] and [employer].   Enjoys writing, playing on the key boards.    Past Surgical History:  Procedure Laterality Date  . ABDOMINAL HYSTERECTOMY  1996  . BREAST BIOPSY Right 07/15/2016   stereo bx of calcs LOQ-path pending  . BREAST BIOPSY Right 07/15/2016   [place] bx of mass-path pending  . BREAST CYST ASPIRATION Left 07/15/2016  . COLONOSCOPY    . SENTINEL NODE BIOPSY Right 08/11/2016   Procedure: SENTINEL NODE BIOPSY;  Surgeon: Leonie Green, MD;  Location: [facility] ORS;  Service: General;  Laterality: Right;  . SIMPLE MASTECTOMY WITH AXILLARY SENTINEL NODE BIOPSY Right 08/11/2016   Procedure: SIMPLE MASTECTOMY;  Surgeon: Leonie Green, MD;  Location: [facility] ORS;  Service: General;  Laterality: Right;    Family History  Problem Relation Age of Onset  . Hypertension Mother   . Hypertension Sister   . Breast cancer Neg Hx     No Known Allergies  Current Outpatient Medications on File Prior to Visit  Medication Sig Dispense Refill  . ACCU-CHEK FASTCLIX LANCETS MISC USE TO CHECK BLOOD GLUCOSE UP TO 4 TIMES DAILY 300 each 1  . ACCU-CHEK GUIDE test strip USE TO CHECK BLOOD GLUCOSE UP TO 4 TIMES DAILY 100 each 5  . amLODipine (NORVASC) 10 MG tablet TAKE 1 TABLET BY MOUTH EVERY DAY 90 tablet 1  . anastrozole (ARIMIDEX) 1 MG tablet Take 1 tablet (1 mg total) by mouth daily. 90 tablet 3  . atorvastatin (LIPITOR) 20 MG tablet Take 1 tablet by mouth at bedtime for cholesterol. 90 tablet 3  . blood glucose meter kit and supplies KIT Dispense based on patient and insurance preference. Use up to four times daily as directed. (FOR ICD-9 250.00, 250.01). 1 each 0  . calcium carbonate (TUMS - DOSED IN MG ELEMENTAL CALCIUM) 500 MG chewable tablet Chew 1 tablet by mouth as needed for indigestion or heartburn.    . Calcium-Magnesium-Vitamin D (CALCIUM 1200+D3 PO) Take 2 tablets by mouth daily. Calcium 1200 units / Vit D 800 mg    . COLLAGEN PO Take 1 capsule by mouth daily.     Monica Villanueva Oil (OMEGA-3) 500 MG CAPS Take 1 capsule by mouth daily.    . Multiple Vitamins-Minerals (HAIR SKIN AND NAILS FORMULA) TABS Take by mouth.     No current facility-administered medications on file prior to visit.     BP 140/80   Pulse 71   Temp 98.5 F (36.9 C) (Oral)   Ht 5' 2"  (1.575 m)   Wt 214 lb 8 oz (97.3 kg)   SpO2 96%   BMI 39.23 kg/m    Objective:   Physical Exam  Constitutional: She is oriented to person, place, and time. She appears well-nourished.  HENT:  Mouth/Throat: No oropharyngeal exudate.  Eyes: Pupils are equal, round, and reactive to light. EOM are normal.  Neck: Neck supple. No thyromegaly present.  Cardiovascular:  Normal rate and regular rhythm.  Respiratory: Effort normal and breath sounds normal.  GI: Soft. Bowel sounds are normal. There is no abdominal tenderness.  Musculoskeletal: Normal range of motion.     Comments: Mild contracture to left middle digit, mild decrease in ROM to DIP and PIP joints. No swelling or erythema.   Neurological: She is alert and oriented to person, place, and time.  Skin: Skin is warm and dry.  Psychiatric: She has a normal mood and affect.           Assessment & Plan:

## 2018-06-01 NOTE — Assessment & Plan Note (Signed)
Intermittent since December 2019.  Decrease in ROM to DIP and PIP joint, mild resting contracture noted on exam. Discussed conservative treatment given mild case. Return precautions provided.

## 2018-06-01 NOTE — Assessment & Plan Note (Signed)
A1C of 6.6 on recent labs, this is well controlled for a 73 year old female. Urine microalbumin negative.  Managed on statin, continue. Foot exam today. Pneumonia vaccination UTD. Recommended regular exercise, healthy diet. Follow up in 6 months.

## 2018-06-01 NOTE — Assessment & Plan Note (Addendum)
Immunizations UTD, Rx for Shingrix provided. Mammogram and bone density scan scheduled for March 2020. Colon cancer screening due in 2021, elects for Cologuard. Recommended to increase exercise, continue to work on a healthy diet. Exam stable. Labs reviewed. Follow up in 1 year for CPE.

## 2018-06-01 NOTE — Assessment & Plan Note (Signed)
Stable today, home readings are better. Continue amlodipine 10 mg.

## 2018-07-04 ENCOUNTER — Ambulatory Visit
Admission: RE | Admit: 2018-07-04 | Discharge: 2018-07-04 | Disposition: A | Discharge status: Home / Self Care | Payer: Medicare HMO | Source: Ambulatory Visit | Attending: Oncology | Admitting: Oncology

## 2018-07-04 ENCOUNTER — Other Ambulatory Visit: Payer: Self-pay

## 2018-07-04 DIAGNOSIS — Z79811 Long term (current) use of aromatase inhibitors: Secondary | ICD-10-CM | POA: Insufficient documentation

## 2018-07-04 DIAGNOSIS — M85851 Other specified disorders of bone density and structure, right thigh: Secondary | ICD-10-CM | POA: Diagnosis not present

## 2018-07-04 DIAGNOSIS — Z5181 Encounter for therapeutic drug level monitoring: Secondary | ICD-10-CM | POA: Insufficient documentation

## 2018-07-04 DIAGNOSIS — Z08 Encounter for follow-up examination after completed treatment for malignant neoplasm: Secondary | ICD-10-CM | POA: Diagnosis not present

## 2018-07-04 DIAGNOSIS — Z853 Personal history of malignant neoplasm of breast: Secondary | ICD-10-CM | POA: Insufficient documentation

## 2018-07-04 DIAGNOSIS — Z780 Asymptomatic menopausal state: Secondary | ICD-10-CM | POA: Diagnosis not present

## 2018-08-03 ENCOUNTER — Other Ambulatory Visit: Payer: Self-pay | Admitting: *Deleted

## 2018-08-03 MED ORDER — ANASTROZOLE 1 MG PO TABS: 1.0000 mg | ORAL_TABLET | Freq: Every day | ORAL | 3 refills | Status: DC

## 2018-08-03 NOTE — Telephone Encounter (Signed)
Incoming pharmacy fax to request for RF on anastrozole.

## 2018-08-07 ENCOUNTER — Other Ambulatory Visit: Payer: Self-pay | Admitting: Primary Care

## 2018-08-07 DIAGNOSIS — I10 Essential (primary) hypertension: Secondary | ICD-10-CM

## 2018-08-25 ENCOUNTER — Other Ambulatory Visit: Payer: Self-pay | Admitting: Primary Care

## 2018-08-25 DIAGNOSIS — I10 Essential (primary) hypertension: Secondary | ICD-10-CM

## 2018-08-25 DIAGNOSIS — E785 Hyperlipidemia, unspecified: Secondary | ICD-10-CM

## 2018-08-25 MED ORDER — ATORVASTATIN CALCIUM 20 MG PO TABS: ORAL_TABLET | ORAL | 1 refills | Status: DC

## 2018-08-25 MED ORDER — AMLODIPINE BESYLATE 10 MG PO TABS: 10.0000 mg | ORAL_TABLET | Freq: Every day | ORAL | 1 refills | Status: DC

## 2018-09-06 DIAGNOSIS — Z853 Personal history of malignant neoplasm of breast: Secondary | ICD-10-CM | POA: Diagnosis not present

## 2018-09-06 DIAGNOSIS — Z9011 Acquired absence of right breast and nipple: Secondary | ICD-10-CM | POA: Diagnosis not present

## 2018-10-26 ENCOUNTER — Ambulatory Visit
Admission: RE | Admit: 2018-10-26 | Discharge: 2018-10-26 | Disposition: A | Discharge status: Home / Self Care | Payer: Medicare HMO | Source: Ambulatory Visit | Attending: General Surgery | Admitting: General Surgery

## 2018-10-26 ENCOUNTER — Other Ambulatory Visit: Payer: Self-pay

## 2018-10-26 DIAGNOSIS — Z1231 Encounter for screening mammogram for malignant neoplasm of breast: Secondary | ICD-10-CM | POA: Insufficient documentation

## 2018-11-03 ENCOUNTER — Other Ambulatory Visit: Payer: Self-pay

## 2018-11-03 ENCOUNTER — Encounter: Payer: Self-pay | Admitting: Oncology

## 2018-11-03 ENCOUNTER — Inpatient Hospital Stay: Payer: Medicare HMO | Attending: Oncology | Admitting: Oncology

## 2018-11-03 VITALS — BP 121/67 | HR 67 | Temp 97.5°F | Resp 16 | Ht 62.0 in | Wt 212.9 lb

## 2018-11-03 DIAGNOSIS — Z170 Estrogen receptor positive status [ER+]: Secondary | ICD-10-CM | POA: Insufficient documentation

## 2018-11-03 DIAGNOSIS — Z79811 Long term (current) use of aromatase inhibitors: Secondary | ICD-10-CM

## 2018-11-03 DIAGNOSIS — Z08 Encounter for follow-up examination after completed treatment for malignant neoplasm: Secondary | ICD-10-CM

## 2018-11-03 DIAGNOSIS — C50911 Malignant neoplasm of unspecified site of right female breast: Secondary | ICD-10-CM | POA: Diagnosis not present

## 2018-11-03 DIAGNOSIS — Z5181 Encounter for therapeutic drug level monitoring: Secondary | ICD-10-CM

## 2018-11-03 NOTE — Progress Notes (Signed)
Hematology/Oncology Consult note [facility]  Telephone:([phone] [phone]  Patient Care Team: Pleas Koch, NP as PCP - General (Internal Medicine)   Name of the patient: Monica Villanueva  [id number]  1945-05-22   Date of visit: 11/03/18  Diagnosis- 2 foci of invasive breast cancer in the right breast Stage I pT1c pn1(mi)sn cM0 ER/PR positive and Her2 negative s/p mastectomy   Chief complaint/ Reason for visit-routine follow-up of breast cancer on Arimidex  Heme/Onc history: Patient is a 73 year old female with history of invasive lobular carcinoma of the right breast status post right simple mastectomy and sentinel lymph node biopsy in April 2018.  PT1CPN1 mi.  MammaPrint score came back at low risk and she did not require adjuvant chemotherapy.  She is currently on Arimidex  Interval history-tolerating Arimidex well without any significant side effects.  Reports that her appetite is good and her weight has remained stable.  Denies any new aches or pains anywhere  ECOG PS- 1 Pain scale- 0   Review of systems- Review of Systems  Constitutional: Negative for chills, fever, malaise/fatigue and weight loss.  HENT: Negative for congestion, ear discharge and nosebleeds.   Eyes: Negative for blurred vision.  Respiratory: Negative for cough, hemoptysis, sputum production, shortness of breath and wheezing.   Cardiovascular: Negative for chest pain, palpitations, orthopnea and claudication.  Gastrointestinal: Negative for abdominal pain, blood in stool, constipation, diarrhea, heartburn, melena, nausea and vomiting.  Genitourinary: Negative for dysuria, flank pain, frequency, hematuria and urgency.  Musculoskeletal: Negative for back pain, joint pain and myalgias.  Skin: Negative for rash.  Neurological: Negative for dizziness, tingling, focal weakness, seizures, weakness and headaches.  Endo/Heme/Allergies: Does not bruise/bleed easily.   Psychiatric/Behavioral: Negative for depression and suicidal ideas. The patient does not have insomnia.       No Known Allergies   Past Medical History:  Diagnosis Date  . Breast cancer ([place]) 2018   right breast  . Cancer ([place])   . GERD (gastroesophageal reflux disease)    OCC  . Keloid of skin    right axilla  . Shingles 01/23/2019     Past Surgical History:  Procedure Laterality Date  . ABDOMINAL HYSTERECTOMY  1996  . BREAST BIOPSY Right 07/15/2016   stereo bx of calcs LOQ-positive  . BREAST BIOPSY Right 07/15/2016   [place] bx of mass-positve  . BREAST CYST ASPIRATION Left 07/15/2016  . COLONOSCOPY    . MASTECTOMY Right 2018  . SENTINEL NODE BIOPSY Right 08/11/2016   Procedure: SENTINEL NODE BIOPSY;  Surgeon: Leonie Green, MD;  Location: [facility] ORS;  Service: General;  Laterality: Right;  . SIMPLE MASTECTOMY WITH AXILLARY SENTINEL NODE BIOPSY Right 08/11/2016   Procedure: SIMPLE MASTECTOMY;  Surgeon: Leonie Green, MD;  Location: [facility] ORS;  Service: General;  Laterality: Right;    Social History   Socioeconomic History  . Marital status: Widowed    Spouse name: Not on file  . Number of children: Not on file  . Years of education: Not on file  . Highest education level: Not on file  Occupational History  . Not on file  Social Needs  . Financial resource strain: Not on file  . Food insecurity    Worry: Not on file    Inability: Not on file  . Transportation needs    Medical: Not on file    Non-medical: Not on file  Tobacco Use  . Smoking status: Never Smoker  . Smokeless tobacco:  Never Used  Substance and Sexual Activity  . Alcohol use: Yes    Comment: rare use  . Drug use: No  . Sexual activity: Not Currently  Lifestyle  . Physical activity    Days per week: Not on file    Minutes per session: Not on file  . Stress: Not on file  Relationships  . Social Herbalist on phone: Not on file    Gets together: Not on file    Attends  religious service: Not on file    Active member of club or organization: Not on file    Attends meetings of clubs or organizations: Not on file    Relationship status: Not on file  . Intimate partner violence    Fear of current or ex partner: Not on file    Emotionally abused: Not on file    Physically abused: Not on file    Forced sexual activity: Not on file  Other Topics Concern  . Not on file  Social History Narrative   Widowed.   2 children, 4 grandchildren.   Retired. Previously worked at [employer].   Enjoys writing, playing on the key boards.    Family History  Problem Relation Age of Onset  . Hypertension Mother   . Hypertension Sister   . Breast cancer Sister      Current Outpatient Medications:  .  ACCU-CHEK FASTCLIX LANCETS MISC, USE TO CHECK BLOOD GLUCOSE UP TO 4 TIMES DAILY, Disp: 300 each, Rfl: 1 .  ACCU-CHEK GUIDE test strip, USE TO CHECK BLOOD GLUCOSE UP TO 4 TIMES DAILY, Disp: 100 each, Rfl: 5 .  amLODipine (NORVASC) 10 MG tablet, Take 1 tablet (10 mg total) by mouth daily., Disp: 90 tablet, Rfl: 1 .  anastrozole (ARIMIDEX) 1 MG tablet, Take 1 tablet (1 mg total) by mouth daily., Disp: 90 tablet, Rfl: 3 .  atorvastatin (LIPITOR) 20 MG tablet, Take 1 tablet by mouth at bedtime for cholesterol., Disp: 90 tablet, Rfl: 1 .  blood glucose meter kit and supplies KIT, Dispense based on patient and insurance preference. Use up to four times daily as directed. (FOR ICD-9 250.00, 250.01)., Disp: 1 each, Rfl: 0 .  calcium carbonate (TUMS - DOSED IN MG ELEMENTAL CALCIUM) 500 MG chewable tablet, Chew 1 tablet by mouth as needed for indigestion or heartburn., Disp: , Rfl:  .  Calcium-Magnesium-Vitamin D (CALCIUM 1200+D3 PO), Take 2 tablets by mouth daily. Calcium 1200 units / Vit D 800 mg, Disp: , Rfl:  .  COLLAGEN PO, Take 1 capsule by mouth daily. , Disp: , Rfl:  .  Krill Oil (OMEGA-3) 500 MG CAPS, Take 1 capsule by mouth daily., Disp: , Rfl:  .  Multiple  Vitamins-Minerals (HAIR SKIN AND NAILS FORMULA) TABS, Take 1 tablet by mouth daily. , Disp: , Rfl:   Physical exam:  Vitals:   11/03/18 1418  BP: 121/67  Pulse: 67  Resp: 16  Temp: (!) 97.5 F (36.4 C)  TempSrc: Tympanic  Weight: 212 lb 14.4 oz (96.6 kg)  Height: _0  (1.575 m)   Physical Exam Constitutional:      General: She is not in acute distress. HENT:     Head: Normocephalic and atraumatic.  Eyes:     Pupils: Pupils are equal, round, and reactive to light.  Neck:     Musculoskeletal: Normal range of motion.  Cardiovascular:     Rate and Rhythm: Normal rate and regular rhythm.  Heart sounds: Normal heart sounds.  Pulmonary:     Effort: Pulmonary effort is normal.     Breath sounds: Normal breath sounds.  Abdominal:     General: Bowel sounds are normal.     Palpations: Abdomen is soft.  Skin:    General: Skin is warm and dry.  Neurological:     Mental Status: She is alert and oriented to person, place, and time.     Patient is status post right mastectomy without reconstruction.  No evidence of chest wall recurrence.  No palpable bilateral axillary adenopathy.  No palpable masses in the left breast   CMP Latest Ref Rng & Units 05/30/2018  Glucose 70 - 99 mg/dL 141(H)  BUN 6 - 23 mg/dL 13  Creatinine 0.40 - 1.20 mg/dL 0.75  Sodium 135 - 145 mEq/L 139  Potassium 3.5 - 5.1 mEq/L 3.9  Chloride 96 - 112 mEq/L 102  CO2 19 - 32 mEq/L 29  Calcium 8.4 - 10.5 mg/dL 9.8  Total Protein 6.0 - 8.3 g/dL -  Total Bilirubin 0.2 - 1.2 mg/dL -  Alkaline Phos 39 - 117 U/L -  AST 0 - 37 U/L -  ALT 0 - 35 U/L -   CBC Latest Ref Rng & Units 11/04/2017  WBC 3.6 - 11.0 K/uL 8.8  Hemoglobin 12.0 - 16.0 g/dL 11.8(L)  Hematocrit 35.0 - 47.0 % 35.1  Platelets 150 - 440 K/uL 211    No images are attached to the encounter.  Mm 3d Screen Breast Uni Left  Result Date: 10/26/2018 CLINICAL DATA:  Screening. EXAM: DIGITAL SCREENING UNILATERAL LEFT MAMMOGRAM WITH CAD AND TOMO  COMPARISON:  Previous exam(s). ACR Breast Density Category b: There are scattered areas of fibroglandular density. FINDINGS: The patient has had a right mastectomy. There are no findings suspicious for malignancy. Images were processed with CAD. IMPRESSION: No mammographic evidence of malignancy. A result letter of this screening mammogram will be mailed directly to the patient. RECOMMENDATION: Screening mammogram in one year.  (Code:SM-L-63M) BI-RADS CATEGORY  1: Negative. Electronically Signed   By: Fidela Salisbury M.D.   On: 10/26/2018 15:11     Assessment and plan- Patient is a 73 y.o. female  invasive breast cancer in the right breast Stage I pT1c pn1(mi)sn cM0 ER/PR positive and Her2 negativestatus post right mastectomy and currently on Arimidex.  She is here for routine follow-up of breast cancer  Clinically patient is doing well and no concerns of recurrence based on today's exam and signs and symptoms.  She recently had a mammogram on 10/25/2021 did not show any malignancy in her left breast.  She will continue to take Arimidex for total.  Of 5 years along with calcium and vitamin D.  I will see her back in 6 months no labs.   Visit Diagnosis 1. Encounter for follow-up surveillance of breast cancer   2. Visit for monitoring Arimidex therapy      Dr. Randa Evens, MD, MPH [facility] at [facility] [phone] 11/03/2018 2:57 PM

## 2018-11-03 NOTE — Progress Notes (Signed)
Tolerating pill for breast cancer with no concerns

## 2018-11-04 ENCOUNTER — Encounter: Payer: Self-pay | Admitting: Oncology

## 2018-11-30 ENCOUNTER — Encounter: Payer: Self-pay | Admitting: Primary Care

## 2018-11-30 ENCOUNTER — Other Ambulatory Visit: Payer: Self-pay

## 2018-11-30 ENCOUNTER — Ambulatory Visit (INDEPENDENT_AMBULATORY_CARE_PROVIDER_SITE_OTHER): Payer: Medicare HMO | Admitting: Primary Care

## 2018-11-30 VITALS — BP 124/76 | HR 63 | Temp 98.0°F | Ht 62.0 in | Wt 211.5 lb

## 2018-11-30 DIAGNOSIS — E119 Type 2 diabetes mellitus without complications: Secondary | ICD-10-CM | POA: Diagnosis not present

## 2018-11-30 LAB — POCT GLYCOSYLATED HEMOGLOBIN (HGB A1C): Hemoglobin A1C: 6.5 % — AB (ref 4.0–5.6)

## 2018-11-30 NOTE — Patient Instructions (Addendum)
Continue to check your blood sugars once daily. You can rotate times of checks: before any meal, 2 hours after any meal, bedtime.  It is important that you improve your diet. Please limit carbohydrates in the form of white bread, rice, pasta, sweets, fast food, fried food, sugary drinks, etc. Increase your consumption of fresh fruits and vegetables, whole grains, lean protein.  Ensure you are consuming 64 ounces of water daily.  Start exercising. You should be getting 150 minutes of exercise weekly.  We will see you in February 2021 for your annual physical or sooner if needed.  It was a pleasure to see you today!

## 2018-11-30 NOTE — Progress Notes (Signed)
Subjective:    Patient ID: Monica Villanueva, female    DOB: [birth year], 73 y.o.   MRN: [id number]  HPI  Monica Villanueva is a 73 year old female who presents today for follow up of diabetes.  Current medications include: None  She is checking her blood glucose 1 times daily and is getting readings of:  2 hours after breakfast: 136, 90's, 120's  Last A1C: 6.6 in February 2020, 6.5 today Last Eye Exam: Completed about one year ago. Last Foot Exam: Due in February 2021 Pneumonia Vaccination: Completed in 2019 ACE/ARB: None, urine micro albumin negative in February 2020 Statin: atorvastatin   Diet currently consists of:  Breakfast: Boiled egg, fruit Lunch: Salad with protein Dinner: Pizza, protein, vegetables, starch Snacks: Occasionally fruit Desserts: Infrequent desserts Beverages: Soda, water  Exercise: She is not exercising.   BP Readings from Last 3 Encounters:  11/30/18 124/76  11/03/18 121/67  06/01/18 140/80       Review of Systems  Respiratory: Negative for shortness of breath.   Cardiovascular: Negative for chest pain.  Neurological: Negative for numbness and headaches.       Past Medical History:  Diagnosis Date  . Breast cancer ([place]) 2018   right breast  . Cancer ([place])   . GERD (gastroesophageal reflux disease)    OCC  . Keloid of skin    right axilla  . Shingles 01/23/2019     Social History   Socioeconomic History  . Marital status: Widowed    Spouse name: Not on file  . Number of children: Not on file  . Years of education: Not on file  . Highest education level: Not on file  Occupational History  . Not on file  Social Needs  . Financial resource strain: Not on file  . Food insecurity    Worry: Not on file    Inability: Not on file  . Transportation needs    Medical: Not on file    Non-medical: Not on file  Tobacco Use  . Smoking status: Never Smoker  . Smokeless tobacco: Never Used  Substance and Sexual Activity  . Alcohol use: Yes     Comment: rare use  . Drug use: No  . Sexual activity: Not Currently  Lifestyle  . Physical activity    Days per week: Not on file    Minutes per session: Not on file  . Stress: Not on file  Relationships  . Social Herbalist on phone: Not on file    Gets together: Not on file    Attends religious service: Not on file    Active member of club or organization: Not on file    Attends meetings of clubs or organizations: Not on file    Relationship status: Not on file  . Intimate partner violence    Fear of current or ex partner: Not on file    Emotionally abused: Not on file    Physically abused: Not on file    Forced sexual activity: Not on file  Other Topics Concern  . Not on file  Social History Narrative   Widowed.   2 children, 4 grandchildren.   Retired. Previously worked at [employer].   Enjoys writing, playing on the key boards.    Past Surgical History:  Procedure Laterality Date  . ABDOMINAL HYSTERECTOMY  1996  . BREAST BIOPSY Right 07/15/2016   stereo bx of calcs LOQ-positive  . BREAST BIOPSY Right 07/15/2016  [place] bx of mass-positve  . BREAST CYST ASPIRATION Left 07/15/2016  . COLONOSCOPY    . MASTECTOMY Right 2018  . SENTINEL NODE BIOPSY Right 08/11/2016   Procedure: SENTINEL NODE BIOPSY;  Surgeon: Leonie Green, MD;  Location: [facility] ORS;  Service: General;  Laterality: Right;  . SIMPLE MASTECTOMY WITH AXILLARY SENTINEL NODE BIOPSY Right 08/11/2016   Procedure: SIMPLE MASTECTOMY;  Surgeon: Leonie Green, MD;  Location: [facility] ORS;  Service: General;  Laterality: Right;    Family History  Problem Relation Age of Onset  . Hypertension Mother   . Hypertension Sister   . Breast cancer Sister     No Known Allergies  Current Outpatient Medications on File Prior to Visit  Medication Sig Dispense Refill  . ACCU-CHEK FASTCLIX LANCETS MISC USE TO CHECK BLOOD GLUCOSE UP TO 4 TIMES DAILY 300 each 1  . ACCU-CHEK GUIDE test  strip USE TO CHECK BLOOD GLUCOSE UP TO 4 TIMES DAILY 100 each 5  . amLODipine (NORVASC) 10 MG tablet Take 1 tablet (10 mg total) by mouth daily. 90 tablet 1  . anastrozole (ARIMIDEX) 1 MG tablet Take 1 tablet (1 mg total) by mouth daily. 90 tablet 3  . atorvastatin (LIPITOR) 20 MG tablet Take 1 tablet by mouth at bedtime for cholesterol. 90 tablet 1  . blood glucose meter kit and supplies KIT Dispense based on patient and insurance preference. Use up to four times daily as directed. (FOR ICD-9 250.00, 250.01). 1 each 0  . calcium carbonate (TUMS - DOSED IN MG ELEMENTAL CALCIUM) 500 MG chewable tablet Chew 1 tablet by mouth as needed for indigestion or heartburn.    . Calcium-Magnesium-Vitamin D (CALCIUM 1200+D3 PO) Take 2 tablets by mouth daily. Calcium 1200 units / Vit D 800 mg    . COLLAGEN PO Take 1 capsule by mouth daily.     Javier Docker Oil (OMEGA-3) 500 MG CAPS Take 1 capsule by mouth daily.    . Multiple Vitamins-Minerals (HAIR SKIN AND NAILS FORMULA) TABS Take 1 tablet by mouth daily.      No current facility-administered medications on file prior to visit.     BP 124/76   Pulse 63   Temp 98 F (36.7 C) (Temporal)   Ht 5' 2"  (1.575 m)   Wt 211 lb 8 oz (95.9 kg)   SpO2 97%   BMI 38.68 kg/m    Objective:   Physical Exam  Constitutional: She appears well-nourished.  Neck: Neck supple.  Cardiovascular: Normal rate and regular rhythm.  Respiratory: Effort normal and breath sounds normal.  Skin: Skin is warm and dry.  Psychiatric: She has a normal mood and affect.           Assessment & Plan:

## 2018-11-30 NOTE — Assessment & Plan Note (Signed)
A1C today of 6.5 which is under good control. Continue off of medications for diabetes.  Discussed to limit sodas, continue to work on a healthy diet. Managed on statin. Urine microalbumin negative in February 2020. Pneumonia vaccination UTD. Foot exam UTD. Discussed to schedule an eye exam.  Follow up in 6 months.

## 2018-12-20 ENCOUNTER — Other Ambulatory Visit: Payer: Self-pay | Admitting: *Deleted

## 2018-12-20 NOTE — Telephone Encounter (Signed)
Patient has 2 refill left on current prescription, She will call [facility]

## 2018-12-21 ENCOUNTER — Other Ambulatory Visit: Payer: Self-pay | Admitting: *Deleted

## 2018-12-21 MED ORDER — ANASTROZOLE 1 MG PO TABS: 1.0000 mg | ORAL_TABLET | Freq: Every day | ORAL | 0 refills | Status: DC

## 2019-01-09 ENCOUNTER — Other Ambulatory Visit: Payer: Self-pay | Admitting: *Deleted

## 2019-01-09 MED ORDER — ANASTROZOLE 1 MG PO TABS: 1.0000 mg | ORAL_TABLET | Freq: Every day | ORAL | 0 refills | Status: DC

## 2019-01-12 ENCOUNTER — Other Ambulatory Visit: Payer: Self-pay | Admitting: Oncology

## 2019-01-23 DIAGNOSIS — B029 Zoster without complications: Secondary | ICD-10-CM

## 2019-01-23 HISTORY — DX: Zoster without complications: B02.9

## 2019-02-17 ENCOUNTER — Other Ambulatory Visit: Payer: Self-pay | Admitting: Primary Care

## 2019-02-17 DIAGNOSIS — I10 Essential (primary) hypertension: Secondary | ICD-10-CM

## 2019-02-17 DIAGNOSIS — E785 Hyperlipidemia, unspecified: Secondary | ICD-10-CM

## 2019-03-03 ENCOUNTER — Telehealth: Payer: Self-pay | Admitting: Primary Care

## 2019-03-03 DIAGNOSIS — I10 Essential (primary) hypertension: Secondary | ICD-10-CM

## 2019-03-03 MED ORDER — LOSARTAN POTASSIUM 50 MG PO TABS: 50.0000 mg | ORAL_TABLET | Freq: Every day | ORAL | 0 refills | Status: DC

## 2019-03-03 NOTE — Telephone Encounter (Signed)
Spoken and notified patient of Monica Millers comments. Patient verbalized understanding and agreeable to the change.Marland Kitchen Appointment on 03/27/2019

## 2019-03-03 NOTE — Telephone Encounter (Signed)
Please notify patient that I received a letter from her dentist that is requesting we change her BP medication. He recommends she stop amlodipine due to her gingivial hyperplasia.  We can stop amlodipine and switch to losartan 50 mg.  If we do this then we need to see her back in the office for BP check and one lab test in 2-3 weeks. Is she agreeable?

## 2019-03-03 NOTE — Telephone Encounter (Signed)
Noted, Rx sent to pharmacy. 

## 2019-03-07 ENCOUNTER — Other Ambulatory Visit: Payer: Self-pay | Admitting: Oncology

## 2019-03-07 ENCOUNTER — Other Ambulatory Visit: Payer: Self-pay | Admitting: Primary Care

## 2019-03-07 DIAGNOSIS — I10 Essential (primary) hypertension: Secondary | ICD-10-CM

## 2019-03-27 ENCOUNTER — Other Ambulatory Visit: Payer: Self-pay

## 2019-03-27 ENCOUNTER — Ambulatory Visit (INDEPENDENT_AMBULATORY_CARE_PROVIDER_SITE_OTHER): Payer: Medicare HMO | Admitting: Primary Care

## 2019-03-27 ENCOUNTER — Other Ambulatory Visit: Payer: Self-pay | Admitting: Primary Care

## 2019-03-27 ENCOUNTER — Encounter: Payer: Self-pay | Admitting: Primary Care

## 2019-03-27 VITALS — BP 122/76 | HR 61 | Temp 96.0°F | Ht 62.0 in | Wt 214.2 lb

## 2019-03-27 DIAGNOSIS — I10 Essential (primary) hypertension: Secondary | ICD-10-CM | POA: Diagnosis not present

## 2019-03-27 LAB — BASIC METABOLIC PANEL
BUN: 9 mg/dL (ref 6–23)
CO2: 29 mEq/L (ref 19–32)
Calcium: 9.4 mg/dL (ref 8.4–10.5)
Chloride: 106 mEq/L (ref 96–112)
Creatinine, Ser: 0.78 mg/dL (ref 0.40–1.20)
GFR: 87.5 mL/min (ref >60.00)
Glucose, Bld: 202 mg/dL — ABNORMAL HIGH (ref 70–99)
Potassium: 4 mEq/L (ref 3.5–5.1)
Sodium: 144 mEq/L (ref 135–145)

## 2019-03-27 MED ORDER — LOSARTAN POTASSIUM 50 MG PO TABS: 50.0000 mg | ORAL_TABLET | Freq: Every day | ORAL | 3 refills | Status: DC

## 2019-03-27 NOTE — Assessment & Plan Note (Signed)
Stable on losartan 50 mg which was changed from Amlodipine per her dentists request, BMP pending. Continue losartan as long as BMP is unremarkable.

## 2019-03-27 NOTE — Patient Instructions (Signed)
Stop by the lab prior to leaving today. I will notify you of your results once received.   Continue taking losartan 50 mg once daily for blood pressure.  It was a pleasure to see you today!

## 2019-03-27 NOTE — Progress Notes (Signed)
Subjective:    Patient ID: Monica Villanueva, female    DOB: Mar 24, 1946, 73 y.o.   MRN: [id number]  HPI  Monica Villanueva is a 73 year old female with a history of hypertension, diabetes, breast cancer, hyperlipidemia who presents today for follow up of hypertension.  She is currently managed on losartan 50 mg which was initiated in mid November 2020 per her dentist's request. She was previously managed on Amlodipine 10 mg and was doing well but due to her gingival disease her dentist requested a change.   She denies headaches, dizziness, chest pain.  BP Readings from Last 3 Encounters:  03/27/19 122/76  11/30/18 124/76  11/03/18 121/67     Review of Systems  Eyes: Negative for visual disturbance.  Respiratory: Negative for shortness of breath.   Cardiovascular: Negative for chest pain.  Neurological: Negative for dizziness and headaches.       Past Medical History:  Diagnosis Date  . Breast cancer ([place]) 2018   right breast  . Cancer ([place])   . GERD (gastroesophageal reflux disease)    OCC  . Keloid of skin    right axilla  . Shingles 01/23/2019     Social History   Socioeconomic History  . Marital status: Widowed    Spouse name: Not on file  . Number of children: Not on file  . Years of education: Not on file  . Highest education level: Not on file  Occupational History  . Not on file  Social Needs  . Financial resource strain: Not on file  . Food insecurity    Worry: Not on file    Inability: Not on file  . Transportation needs    Medical: Not on file    Non-medical: Not on file  Tobacco Use  . Smoking status: Never Smoker  . Smokeless tobacco: Never Used  Substance and Sexual Activity  . Alcohol use: Yes    Comment: rare use  . Drug use: No  . Sexual activity: Not Currently  Lifestyle  . Physical activity    Days per week: Not on file    Minutes per session: Not on file  . Stress: Not on file  Relationships  . Social Herbalist on phone: Not on  file    Gets together: Not on file    Attends religious service: Not on file    Active member of club or organization: Not on file    Attends meetings of clubs or organizations: Not on file    Relationship status: Not on file  . Intimate partner violence    Fear of current or ex partner: Not on file    Emotionally abused: Not on file    Physically abused: Not on file    Forced sexual activity: Not on file  Other Topics Concern  . Not on file  Social History Narrative   Widowed.   2 children, 4 grandchildren.   Retired. Previously worked at [employer].   Enjoys writing, playing on the key boards.    Past Surgical History:  Procedure Laterality Date  . ABDOMINAL HYSTERECTOMY  1996  . BREAST BIOPSY Right 07/15/2016   stereo bx of calcs LOQ-positive  . BREAST BIOPSY Right 07/15/2016   [place] bx of mass-positve  . BREAST CYST ASPIRATION Left 07/15/2016  . COLONOSCOPY    . MASTECTOMY Right 2018  . SENTINEL NODE BIOPSY Right 08/11/2016   Procedure: SENTINEL NODE BIOPSY;  Surgeon: Leonie Green, MD;  Location: [facility] ORS;  Service: General;  Laterality: Right;  . SIMPLE MASTECTOMY WITH AXILLARY SENTINEL NODE BIOPSY Right 08/11/2016   Procedure: SIMPLE MASTECTOMY;  Surgeon: Leonie Green, MD;  Location: [facility] ORS;  Service: General;  Laterality: Right;    Family History  Problem Relation Age of Onset  . Hypertension Mother   . Hypertension Sister   . Breast cancer Sister     No Known Allergies  Current Outpatient Medications on File Prior to Visit  Medication Sig Dispense Refill  . ACCU-CHEK FASTCLIX LANCETS MISC USE TO CHECK BLOOD GLUCOSE UP TO 4 TIMES DAILY 300 each 1  . ACCU-CHEK GUIDE test strip USE TO CHECK BLOOD GLUCOSE UP TO 4 TIMES DAILY 100 each 5  . anastrozole (ARIMIDEX) 1 MG tablet TAKE 1 TABLET BY MOUTH EVERY DAY 90 tablet 1  . atorvastatin (LIPITOR) 20 MG tablet TAKE 1 TABLET BY MOUTH AT BEDTIME FOR CHOLESTEROL. 90 tablet 1  . blood glucose  meter kit and supplies KIT Dispense based on patient and insurance preference. Use up to four times daily as directed. (FOR ICD-9 250.00, 250.01). 1 each 0  . calcium carbonate (TUMS - DOSED IN MG ELEMENTAL CALCIUM) 500 MG chewable tablet Chew 1 tablet by mouth as needed for indigestion or heartburn.    . Calcium-Magnesium-Vitamin D (CALCIUM 1200+D3 PO) Take 2 tablets by mouth daily. Calcium 1200 units / Vit D 800 mg    . COLLAGEN PO Take 1 capsule by mouth daily.     Javier Docker Oil (OMEGA-3) 500 MG CAPS Take 1 capsule by mouth daily.    Marland Kitchen losartan (COZAAR) 50 MG tablet TAKE 1 TABLET (50 MG TOTAL) BY MOUTH DAILY. FOR BLOOD PRESSURE. 90 tablet 0  . Multiple Vitamins-Minerals (HAIR SKIN AND NAILS FORMULA) TABS Take 1 tablet by mouth daily.      No current facility-administered medications on file prior to visit.     BP 122/76   Pulse 61   Temp (!) 96 F (35.6 C) (Temporal)   Ht 5' 2"  (1.575 m)   Wt 214 lb 4 oz (97.2 kg)   SpO2 98%   BMI 39.19 kg/m    Objective:   Physical Exam  Constitutional: She appears well-nourished.  Neck: Neck supple.  Cardiovascular: Normal rate and regular rhythm.  Respiratory: Effort normal and breath sounds normal.  Skin: Skin is warm and dry.           Assessment & Plan:

## 2019-03-30 ENCOUNTER — Other Ambulatory Visit: Payer: Self-pay | Admitting: Primary Care

## 2019-03-30 DIAGNOSIS — I10 Essential (primary) hypertension: Secondary | ICD-10-CM

## 2019-05-09 ENCOUNTER — Other Ambulatory Visit: Payer: Self-pay

## 2019-05-09 ENCOUNTER — Inpatient Hospital Stay: Payer: Medicare HMO | Attending: Oncology | Admitting: Oncology

## 2019-05-09 ENCOUNTER — Encounter: Payer: Self-pay | Admitting: Oncology

## 2019-05-09 DIAGNOSIS — Z5181 Encounter for therapeutic drug level monitoring: Secondary | ICD-10-CM

## 2019-05-09 DIAGNOSIS — Z79811 Long term (current) use of aromatase inhibitors: Secondary | ICD-10-CM

## 2019-05-09 DIAGNOSIS — Z853 Personal history of malignant neoplasm of breast: Secondary | ICD-10-CM | POA: Diagnosis not present

## 2019-05-09 DIAGNOSIS — Z08 Encounter for follow-up examination after completed treatment for malignant neoplasm: Secondary | ICD-10-CM | POA: Diagnosis not present

## 2019-05-09 NOTE — Progress Notes (Signed)
Patient stated that she had been having right chest discomfort/sensitivity for the past two months. Patient denied having pain, nipple discharge or knot/lump on her left breast. Patient's last mammogram 10/26/2018 and Bone Density 07/04/2018.

## 2019-05-11 NOTE — Progress Notes (Signed)
I connected with Monica Villanueva on 05/11/19 at 10:15 AM EST by video enabled telemedicine visit and verified that I am speaking with the correct person using two identifiers.   I discussed the limitations, risks, security and privacy concerns of performing an evaluation and management service by telemedicine and the availability of in-person appointments. I also discussed with the patient that there may be a patient responsible charge related to this service. The patient expressed understanding and agreed to proceed.  Other persons participating in the visit and their role in the encounter:  none  Patient's location:  home Provider's location:  Work  Diagnosis- 2 foci of invasive breast cancer in the right breast Stage I pT1c pn1(mi)sn cM0 ER/PR positive and Her2 negatives/p mastectomy  Chief Complaint: Routine follow-up of breast cancer  History of present illness: Patient is a 74 year old female with history of invasive lobular carcinoma of the right breast status post right simple mastectomy and sentinel lymph node biopsy in April 2018.  PT1CPN1 mi.  MammaPrint score came back at low risk and she did not require adjuvant chemotherapy.  She is currently on Arimidex   Interval history: Patient reports having occasional pain in her area of mastectomy which comes and goes.  Her appetite is good and her weight has remained stable.  She is tolerating Arimidex along with calcium and vitamin D well without any significant side effects.   Review of Systems  Constitutional: Negative for chills, fever, malaise/fatigue and weight loss.  HENT: Negative for congestion, ear discharge and nosebleeds.   Eyes: Negative for blurred vision.  Respiratory: Negative for cough, hemoptysis, sputum production, shortness of breath and wheezing.   Cardiovascular: Negative for chest pain, palpitations, orthopnea and claudication.  Gastrointestinal: Negative for abdominal pain, blood in stool, constipation, diarrhea,  heartburn, melena, nausea and vomiting.  Genitourinary: Negative for dysuria, flank pain, frequency, hematuria and urgency.  Musculoskeletal: Negative for back pain, joint pain and myalgias.  Skin: Negative for rash.  Neurological: Negative for dizziness, tingling, focal weakness, seizures, weakness and headaches.  Endo/Heme/Allergies: Does not bruise/bleed easily.  Psychiatric/Behavioral: Negative for depression and suicidal ideas. The patient does not have insomnia.     No Known Allergies  Past Medical History:  Diagnosis Date  . Breast cancer ([place]) 2018   right breast  . Cancer ([place])   . GERD (gastroesophageal reflux disease)    OCC  . Keloid of skin    right axilla  . Shingles 01/23/2019    Past Surgical History:  Procedure Laterality Date  . ABDOMINAL HYSTERECTOMY  1996  . BREAST BIOPSY Right 07/15/2016   stereo bx of calcs LOQ-positive  . BREAST BIOPSY Right 07/15/2016   [place] bx of mass-positve  . BREAST CYST ASPIRATION Left 07/15/2016  . COLONOSCOPY    . MASTECTOMY Right 2018  . SENTINEL NODE BIOPSY Right 08/11/2016   Procedure: SENTINEL NODE BIOPSY;  Surgeon: Leonie Green, MD;  Location: [facility] ORS;  Service: General;  Laterality: Right;  . SIMPLE MASTECTOMY WITH AXILLARY SENTINEL NODE BIOPSY Right 08/11/2016   Procedure: SIMPLE MASTECTOMY;  Surgeon: Leonie Green, MD;  Location: [facility] ORS;  Service: General;  Laterality: Right;    Social History   Socioeconomic History  . Marital status: Widowed    Spouse name: Not on file  . Number of children: Not on file  . Years of education: Not on file  . Highest education level: Not on file  Occupational History  . Not on file  Tobacco Use  .  Smoking status: Never Smoker  . Smokeless tobacco: Never Used  Substance and Sexual Activity  . Alcohol use: Yes    Comment: rare use  . Drug use: No  . Sexual activity: Not Currently  Other Topics Concern  . Not on file  Social History Narrative   Widowed.   2  children, 4 grandchildren.   Retired. Previously worked at [employer].   Enjoys writing, playing on the key boards.   Social Determinants of Health   Financial Resource Strain:   . Difficulty of Paying Living Expenses: Not on file  Food Insecurity:   . Worried About Charity fundraiser in the Last Year: Not on file  . Ran Out of Food in the Last Year: Not on file  Transportation Needs:   . Lack of Transportation (Medical): Not on file  . Lack of Transportation (Non-Medical): Not on file  Physical Activity:   . Days of Exercise per Week: Not on file  . Minutes of Exercise per Session: Not on file  Stress:   . Feeling of Stress : Not on file  Social Connections:   . Frequency of Communication with Friends and Family: Not on file  . Frequency of Social Gatherings with Friends and Family: Not on file  . Attends Religious Services: Not on file  . Active Member of Clubs or Organizations: Not on file  . Attends Archivist Meetings: Not on file  . Marital Status: Not on file  Intimate Partner Violence:   . Fear of Current or Ex-Partner: Not on file  . Emotionally Abused: Not on file  . Physically Abused: Not on file  . Sexually Abused: Not on file    Family History  Problem Relation Age of Onset  . Hypertension Mother   . Hypertension Sister   . Breast cancer Sister      Current Outpatient Medications:  .  ACCU-CHEK FASTCLIX LANCETS MISC, USE TO CHECK BLOOD GLUCOSE UP TO 4 TIMES DAILY, Disp: 300 each, Rfl: 1 .  ACCU-CHEK GUIDE test strip, USE TO CHECK BLOOD GLUCOSE UP TO 4 TIMES DAILY, Disp: 100 each, Rfl: 5 .  anastrozole (ARIMIDEX) 1 MG tablet, TAKE 1 TABLET BY MOUTH EVERY DAY, Disp: 90 tablet, Rfl: 1 .  atorvastatin (LIPITOR) 20 MG tablet, TAKE 1 TABLET BY MOUTH AT BEDTIME FOR CHOLESTEROL., Disp: 90 tablet, Rfl: 1 .  blood glucose meter kit and supplies KIT, Dispense based on patient and insurance preference. Use up to four times daily as directed.  (FOR ICD-9 250.00, 250.01)., Disp: 1 each, Rfl: 0 .  calcium carbonate (TUMS - DOSED IN MG ELEMENTAL CALCIUM) 500 MG chewable tablet, Chew 1 tablet by mouth as needed for indigestion or heartburn., Disp: , Rfl:  .  Calcium-Magnesium-Vitamin D (CALCIUM 1200+D3 PO), Take 2 tablets by mouth daily. Calcium 1200 units / Vit D 800 mg, Disp: , Rfl:  .  COLLAGEN PO, Take 1 capsule by mouth daily. , Disp: , Rfl:  .  Krill Oil (OMEGA-3) 500 MG CAPS, Take 1 capsule by mouth daily., Disp: , Rfl:  .  losartan (COZAAR) 50 MG tablet, Take 1 tablet (50 mg total) by mouth daily. For blood pressure., Disp: 90 tablet, Rfl: 3 .  Multiple Vitamins-Minerals (HAIR SKIN AND NAILS FORMULA) TABS, Take 1 tablet by mouth daily. , Disp: , Rfl:   No results found.  No images are attached to the encounter.   CMP Latest Ref Rng & Units 03/27/2019  Glucose 70 -  99 mg/dL 202(H)  BUN 6 - 23 mg/dL 9  Creatinine 0.40 - 1.20 mg/dL 0.78  Sodium 135 - 145 mEq/L 144  Potassium 3.5 - 5.1 mEq/L 4.0  Chloride 96 - 112 mEq/L 106  CO2 19 - 32 mEq/L 29  Calcium 8.4 - 10.5 mg/dL 9.4  Total Protein 6.0 - 8.3 g/dL -  Total Bilirubin 0.2 - 1.2 mg/dL -  Alkaline Phos 39 - 117 U/L -  AST 0 - 37 U/L -  ALT 0 - 35 U/L -   CBC Latest Ref Rng & Units 11/04/2017  WBC 3.6 - 11.0 K/uL 8.8  Hemoglobin 12.0 - 16.0 g/dL 11.8(L)  Hematocrit 35.0 - 47.0 % 35.1  Platelets 150 - 440 K/uL 211     Observation/objective: Appears in no acute distress of a video visit today.  Breathing is nonlabored  Assessment and plan:Patient is a 74 y.o. female invasive breast cancer in the right breast Stage I pT1c pn1(mi)sn cM0 ER/PR positive and Her2 negativestatus post right mastectomy and currently on Arimidex.   This is a routine follow-up visit for breast cancer  Patient is tolerating Arimidex along with calcium and vitamin D well without any significant side effects. She underwent a screening mammogram in July 2020 which did not show any evidence of  malignancy.  The next mammogram will be coordinated by Dr. Peyton Najjar.  She has occasional pain at her mastectomy site which I think is post mastectomy neuropathic pain and will continue to monitor.  Patient knows to call us with any questions or concerns.  I will see her back in 6 months for a video visit without labs.    Follow-up instructions: As above  I discussed the assessment and treatment plan with the patient. The patient was provided an opportunity to ask questions and all were answered. The patient agreed with the plan and demonstrated an understanding of the instructions.   The patient was advised to call back or seek an in-person evaluation if the symptoms worsen or if the condition fails to improve as anticipated.   Visit Diagnosis: 1. Visit for monitoring Arimidex therapy   2. Encounter for follow-up surveillance of breast cancer     Dr. Randa Evens, MD, MPH [facility] Tel- [phone] 05/11/2019 12:08 PM

## 2019-05-30 ENCOUNTER — Other Ambulatory Visit: Payer: Self-pay | Admitting: Primary Care

## 2019-05-30 DIAGNOSIS — I10 Essential (primary) hypertension: Secondary | ICD-10-CM

## 2019-05-30 DIAGNOSIS — E785 Hyperlipidemia, unspecified: Secondary | ICD-10-CM

## 2019-05-30 DIAGNOSIS — E119 Type 2 diabetes mellitus without complications: Secondary | ICD-10-CM

## 2019-06-05 ENCOUNTER — Other Ambulatory Visit (INDEPENDENT_AMBULATORY_CARE_PROVIDER_SITE_OTHER): Payer: Medicare HMO

## 2019-06-05 ENCOUNTER — Ambulatory Visit: Payer: Medicare HMO

## 2019-06-05 ENCOUNTER — Other Ambulatory Visit: Payer: Self-pay

## 2019-06-05 DIAGNOSIS — E785 Hyperlipidemia, unspecified: Secondary | ICD-10-CM | POA: Diagnosis not present

## 2019-06-05 DIAGNOSIS — I10 Essential (primary) hypertension: Secondary | ICD-10-CM

## 2019-06-05 DIAGNOSIS — E119 Type 2 diabetes mellitus without complications: Secondary | ICD-10-CM

## 2019-06-05 LAB — CBC
HCT: 36.1 % (ref 36.0–46.0)
Hemoglobin: 11.9 g/dL — ABNORMAL LOW (ref 12.0–15.0)
MCHC: 33 g/dL (ref 30.0–36.0)
MCV: 93.1 fl (ref 78.0–100.0)
Platelets: 200 10*3/uL (ref 150.0–400.0)
RBC: 3.88 Mil/uL (ref 3.87–5.11)
RDW: 12.6 % (ref 11.5–15.5)
WBC: 8 10*3/uL (ref 4.0–10.5)

## 2019-06-05 LAB — LIPID PANEL
Cholesterol: 156 mg/dL (ref 0–200)
HDL: 45.6 mg/dL (ref >39.00)
LDL Cholesterol: 99 mg/dL (ref 0–99)
NonHDL: 110.06
Total CHOL/HDL Ratio: 3
Triglycerides: 56 mg/dL (ref 0.0–149.0)
VLDL: 11.2 mg/dL (ref 0.0–40.0)

## 2019-06-05 LAB — HEMOGLOBIN A1C: Hgb A1c MFr Bld: 7.8 % — ABNORMAL HIGH (ref 4.6–6.5)

## 2019-06-05 LAB — COMPREHENSIVE METABOLIC PANEL
ALT: 9 U/L (ref 0–35)
AST: 13 U/L (ref 0–37)
Albumin: 4 g/dL (ref 3.5–5.2)
Alkaline Phosphatase: 90 U/L (ref 39–117)
BUN: 18 mg/dL (ref 6–23)
CO2: 27 mEq/L (ref 19–32)
Calcium: 9.2 mg/dL (ref 8.4–10.5)
Chloride: 102 mEq/L (ref 96–112)
Creatinine, Ser: 0.85 mg/dL (ref 0.40–1.20)
GFR: 79.19 mL/min (ref >60.00)
Glucose, Bld: 210 mg/dL — ABNORMAL HIGH (ref 70–99)
Potassium: 3.3 mEq/L — ABNORMAL LOW (ref 3.5–5.1)
Sodium: 137 mEq/L (ref 135–145)
Total Bilirubin: 0.5 mg/dL (ref 0.2–1.2)
Total Protein: 7.6 g/dL (ref 6.0–8.3)

## 2019-06-06 ENCOUNTER — Ambulatory Visit (INDEPENDENT_AMBULATORY_CARE_PROVIDER_SITE_OTHER): Payer: Medicare HMO

## 2019-06-06 ENCOUNTER — Other Ambulatory Visit: Payer: Self-pay

## 2019-06-06 VITALS — BP 119/76 | Wt 112.0 lb

## 2019-06-06 DIAGNOSIS — Z0000 Encounter for general adult medical examination without abnormal findings: Secondary | ICD-10-CM

## 2019-06-06 NOTE — Progress Notes (Addendum)
Subjective:   Monica Villanueva is a 74 y.o. female who presents for Medicare Annual (Subsequent) preventive examination.  Review of Systems: N/A   This visit is being conducted through telemedicine via telephone at the nurse health advisor's home address due to the COVID-19 pandemic. This patient has given me verbal consent via doximity to conduct this visit, patient states they are participating from their home address. Patient and myself are on the telephone call. There is no referral for this visit. Some vital signs may be absent or patient reported.    Patient identification: identified by name, DOB, and current address   Cardiac Risk Factors include: advanced age (>24mn, >>57women);diabetes mellitus;hypertension;dyslipidemia     Objective:     Vitals: BP 119/76   Wt 112 lb (50.8 kg)   BMI 20.49 kg/m   Body mass index is 20.49 kg/m.  Advanced Directives 06/06/2019 05/09/2019 11/03/2018 05/30/2018 05/05/2018 11/04/2017 05/14/2017  Does Patient Have a Medical Advance Directive? No No No No No No No  Would patient like information on creating a medical advance directive? No - Patient declined No - Patient declined No - Patient declined No - Patient declined No - Patient declined Yes (MAU/Ambulatory/Procedural Areas - Information given) No - Patient declined    Tobacco Social History   Tobacco Use  Smoking Status Never Smoker  Smokeless Tobacco Never Used     Counseling given: Not Answered   Clinical Intake:  Pre-visit preparation completed: Yes  Pain : No/denies pain     Nutritional Risks: None Diabetes: Yes CBG done?: No Did pt. bring in CBG monitor from home?: No  How often do you need to have someone help you when you read instructions, pamphlets, or other written materials from your doctor or pharmacy?: 1 - Never What is the last grade level you completed in school?: 12th  Interpreter Needed?: No  Information entered by :: CJohnson, LPN  Past Medical History:    Diagnosis Date  . [place]   [place] breast  . [place]   . GERD (gastroesophageal reflux disease)    OCC  . Keloid of skin    right axilla  . Shingles 01/23/2019   Past Surgical History:  Procedure Laterality Date  . ABDOMINAL HYSTERECTOMY  1996  . BREAST BIOPSY Right 07/15/2016   stereo bx of calcs LOQ-positive  . BREAST BIOPSY Right 07/15/2016   uKoreabx of mass-positve  . BREAST CYST ASPIRATION Left 07/15/2016  . COLONOSCOPY    . MASTECTOMY Right 2018  . SENTINEL NODE BIOPSY Right 08/11/2016   Procedure: SENTINEL NODE BIOPSY;  Surgeon: JLeonie Green MD;  Location: [facility] ORS;  Service: General;  Laterality: Right;  . SIMPLE MASTECTOMY WITH AXILLARY SENTINEL NODE BIOPSY Right 08/11/2016   Procedure: SIMPLE MASTECTOMY;  Surgeon: JLeonie Green MD;  Location: [facility] ORS;  Service: General;  Laterality: Right;   Family History  Problem Relation Age of Onset  . Hypertension Mother   . Hypertension Sister   . Breast cancer Sister    Social History   Socioeconomic History  . Marital status: Widowed    Spouse name: Not on file  . Number of children: Not on file  . Years of education: Not on file  . Highest education level: Not on file  Occupational History  . Not on file  Tobacco Use  . Smoking status: Never Smoker  . Smokeless tobacco: Never Used  Substance and Sexual Activity  . Alcohol use: Yes  Comment: rare use  . Drug use: No  . Sexual activity: Not Currently  Other Topics Concern  . Not on file  Social History Narrative   Widowed.   2 children, 4 grandchildren.   Retired. Previously worked at [employer].   Enjoys writing, playing on the key boards.   Social Determinants of Health   Financial Resource Strain: Low Risk   . Difficulty of Paying Living Expenses: Not hard at all  Food Insecurity: No Food Insecurity  . Worried About Charity fundraiser in the Last Year: Never true  . Ran Out of Food in the Last  Year: Never true  Transportation Needs: No Transportation Needs  . Lack of Transportation (Medical): No  . Lack of Transportation (Non-Medical): No  Physical Activity: Sufficiently Active  . Days of Exercise per Week: 7 days  . Minutes of Exercise per Session: 30 min  Stress: No Stress Concern Present  . Feeling of Stress : Not at all  Social Connections:   . Frequency of Communication with Friends and Family: Not on file  . Frequency of Social Gatherings with Friends and Family: Not on file  . Attends Religious Services: Not on file  . Active Member of Clubs or Organizations: Not on file  . Attends Archivist Meetings: Not on file  . Marital Status: Not on file    Outpatient Encounter Medications as of 06/06/2019  Medication Sig  . ACCU-CHEK FASTCLIX LANCETS MISC USE TO CHECK BLOOD GLUCOSE UP TO 4 TIMES DAILY  . ACCU-CHEK GUIDE test strip USE TO CHECK BLOOD GLUCOSE UP TO 4 TIMES DAILY  . anastrozole (ARIMIDEX) 1 MG tablet TAKE 1 TABLET BY MOUTH EVERY DAY  . atorvastatin (LIPITOR) 20 MG tablet TAKE 1 TABLET BY MOUTH AT BEDTIME FOR CHOLESTEROL.  Marland Kitchen blood glucose meter kit and supplies KIT Dispense based on patient and insurance preference. Use up to four times daily as directed. (FOR ICD-9 250.00, 250.01).  . calcium carbonate (TUMS - DOSED IN MG ELEMENTAL CALCIUM) 500 MG chewable tablet Chew 1 tablet by mouth as needed for indigestion or heartburn.  . Calcium-Magnesium-Vitamin D (CALCIUM 1200+D3 PO) Take 2 tablets by mouth daily. Calcium 1200 units / Vit D 800 mg  . COLLAGEN PO Take 1 capsule by mouth daily.   Javier Docker Oil (OMEGA-3) 500 MG CAPS Take 1 capsule by mouth daily.  Marland Kitchen losartan (COZAAR) 50 MG tablet Take 1 tablet (50 mg total) by mouth daily. For blood pressure.  . Multiple Vitamins-Minerals (HAIR SKIN AND NAILS FORMULA) TABS Take 1 tablet by mouth daily.    No facility-administered encounter medications on file as of 06/06/2019.    Activities of Daily Living In  your present state of health, do you have any difficulty performing the following activities: 06/06/2019  Hearing? N  Vision? N  Difficulty concentrating or making decisions? N  Walking or climbing stairs? N  Dressing or bathing? N  Doing errands, shopping? N  Preparing Food and eating ? N  Using the Toilet? N  In the past six months, have you accidently leaked urine? N  Do you have problems with loss of bowel control? N  Managing your Medications? N  Managing your Finances? N  Housekeeping or managing your Housekeeping? N  Some recent data might be hidden    Patient Care Team: Pleas Koch, NP as PCP - General (Internal Medicine)    Assessment:   This is a routine wellness examination for [place].  Exercise Activities and Dietary recommendations Current Exercise Habits: Home exercise routine, Type of exercise: strength training/weights;stretching, Time (Minutes): 30, Frequency (Times/Week): 7, Weekly Exercise (Minutes/Week): 210, Intensity: Moderate, Exercise limited by: None identified  Goals    . Increase physical activity     Starting 05/30/2018, I will continue to exercise for at least 60 minutes daily.     . Patient Stated     06/06/2019, I will continue to exercise everyday for about 30 minutes.        Fall Risk Fall Risk  06/06/2019 05/30/2018 05/14/2017  Falls in the past year? 0 0 No  Number falls in past yr: 0 - -  Injury with Fall? 0 - -  Risk for fall due to : Medication side effect - -  Follow up Falls evaluation completed;Falls prevention discussed - -   Is the patient's home free of loose throw rugs in walkways, pet beds, electrical cords, etc?   yes      Grab bars in the bathroom? yes      Handrails on the stairs?   Yes      Adequate lighting?   yes  Timed Get Up and Go performed: N/A  Depression Screen PHQ 2/9 Scores 06/06/2019 05/30/2018 05/14/2017 05/13/2016  PHQ - 2 Score 0 0 0 0  PHQ- 9 Score 0 0 0 -     Cognitive Function MMSE - Mini Mental  State Exam 06/06/2019 05/30/2018 05/14/2017  Orientation to time 5 5 5   Orientation to Place 5 5 5   Registration 3 3 3   Attention/ Calculation 5 0 0  Recall 3 3 3   Language- name 2 objects - 0 0  Language- repeat 1 1 1   Language- follow 3 step command - 3 3  Language- read & follow direction - 0 0  Write a sentence - 0 0  Copy design - 0 0  Total score - 20 20  Mini Cog  Mini-Cog screen was completed. Maximum score is 22. A value of 0 denotes this part of the MMSE was not completed or the patient failed this part of the Mini-Cog screening.       Immunization History  Administered Date(s) Administered  . Fluad Quad(high Dose 65+) 12/21/2018  . Influenza,inj,Quad PF,6+ Mos 05/13/2016, 05/14/2017, 05/30/2018  . Pneumococcal Conjugate-13 05/14/2017  . Pneumococcal Polysaccharide-23 05/30/2018  . Zoster Recombinat (Shingrix) 06/02/2018, 12/21/2018    Qualifies for Shingles Vaccine: Completed series  Screening Tests Health Maintenance  Topic Date Due  . OPHTHALMOLOGY EXAM  08/14/2018  . Fecal DNA (Cologuard)  05/20/2019  . FOOT EXAM  06/02/2019  . TETANUS/TDAP  04/19/2020 (Originally 10/24/1964)  . HEMOGLOBIN A1C  12/03/2019  . MAMMOGRAM  10/25/2020  . INFLUENZA VACCINE  Completed  . DEXA SCAN  Completed  . Hepatitis C Screening  Completed  . PNA vac Low Risk Adult  Completed    Cancer Screenings: Lung: Low Dose CT Chest recommended if Age 34-80 years, 30 pack-year currently smoking OR have quit w/in 15 years. Patient does not qualify. Breast:  Up to date on Mammogram: Yes, completed 10/26/2018  Up to date of Bone Density/Dexa: Yes, completed 07/04/2018 Colorectal: Cologuard due   Additional Screenings:  Hepatitis C Screening: 05/13/2016     Plan:   Patient will continue to exercise everyday for 30 minutes.    I have personally reviewed and noted the following in the patient's chart:   . Medical and social history . Use of alcohol, tobacco or illicit drugs  .  Current  medications and supplements . Functional ability and status . Nutritional status . Physical activity . Advanced directives . List of other physicians . Hospitalizations, surgeries, and ER visits in previous 12 months . Vitals . Screenings to include cognitive, depression, and falls . Referrals and appointments  In addition, I have reviewed and discussed with patient certain preventive protocols, quality metrics, and best practice recommendations. A written personalized care plan for preventive services as well as general preventive health recommendations were provided to patient.     Andrez Grime, LPN  10/28/6267

## 2019-06-06 NOTE — Progress Notes (Signed)
PCP notes:  Health Maintenance: Needs Cologuard kit  Eye exam- Patient will schedule her own appointment   Abnormal Screenings: none   Patient concerns: none   Nurse concerns: none   Next PCP appt.: 06/08/2019 @ 7:40 am

## 2019-06-06 NOTE — Patient Instructions (Signed)
Ms. Monica Villanueva , Thank you for taking time to come for your Medicare Wellness Visit. I appreciate your ongoing commitment to your health goals. Please review the following plan we discussed and let me know if I can assist you in the future.   Screening recommendations/referrals: Colonoscopy: Cologuard due Mammogram: Up to date, completed 10/26/2018 Bone Density: Up to date, completed 07/04/2018 Recommended yearly ophthalmology/optometry visit for glaucoma screening and checkup Recommended yearly dental visit for hygiene and checkup  Vaccinations: Influenza vaccine: Up to date, completed 12/21/2018 Pneumococcal vaccine: Completed series Tdap vaccine: decline Shingles vaccine: Completed series    Advanced directives: Advance directive discussed with you today. Even though you declined this today please call our office should you change your mind and we can give you the proper paperwork for you to fill out.  Conditions/risks identified: DM, HTN, hyperlipidemia  Next appointment: 06/08/2019 @ 7:40 am    Preventive Care 7 Years and Older, Female Preventive care refers to lifestyle choices and visits with your health care provider that can promote health and wellness. What does preventive care include?  A yearly physical exam. This is also called an annual well check.  Dental exams once or twice a year.  Routine eye exams. Ask your health care provider how often you should have your eyes checked.  Personal lifestyle choices, including:  Daily care of your teeth and gums.  Regular physical activity.  Eating a healthy diet.  Avoiding tobacco and drug use.  Limiting alcohol use.  Practicing safe sex.  Taking low-dose aspirin every day.  Taking vitamin and mineral supplements as recommended by your health care provider. What happens during an annual well check? The services and screenings done by your health care provider during your annual well check will depend on your age, overall  health, lifestyle risk factors, and family history of disease. Counseling  Your health care provider may ask you questions about your:  Alcohol use.  Tobacco use.  Drug use.  Emotional well-being.  Home and relationship well-being.  Sexual activity.  Eating habits.  History of falls.  Memory and ability to understand (cognition).  Work and work Statistician.  Reproductive health. Screening  You may have the following tests or measurements:  Height, weight, and BMI.  Blood pressure.  Lipid and cholesterol levels. These may be checked every 5 years, or more frequently if you are over 45 years old.  Skin check.  Lung cancer screening. You may have this screening every year starting at age 60 if you have a 30-pack-year history of smoking and currently smoke or have quit within the past 15 years.  Fecal occult blood test (FOBT) of the stool. You may have this test every year starting at age 49.  Flexible sigmoidoscopy or colonoscopy. You may have a sigmoidoscopy every 5 years or a colonoscopy every 10 years starting at age 31.  Hepatitis C blood test.  Hepatitis B blood test.  Sexually transmitted disease (STD) testing.  Diabetes screening. This is done by checking your blood sugar (glucose) after you have not eaten for a while (fasting). You may have this done every 1-3 years.  Bone density scan. This is done to screen for osteoporosis. You may have this done starting at age 46.  Mammogram. This may be done every 1-2 years. Talk to your health care provider about how often you should have regular mammograms. Talk with your health care provider about your test results, treatment options, and if necessary, the need for more tests. Vaccines  Your health care provider may recommend certain vaccines, such as:  Influenza vaccine. This is recommended every year.  Tetanus, diphtheria, and acellular pertussis (Tdap, Td) vaccine. You may need a Td booster every 10  years.  Zoster vaccine. You may need this after age 65.  Pneumococcal 13-valent conjugate (PCV13) vaccine. One dose is recommended after age 14.  Pneumococcal polysaccharide (PPSV23) vaccine. One dose is recommended after age 45. Talk to your health care provider about which screenings and vaccines you need and how often you need them. This information is not intended to replace advice given to you by your health care provider. Make sure you discuss any questions you have with your health care provider. Document Released: 05/03/2015 Document Revised: 12/25/2015 Document Reviewed: 02/05/2015 Elsevier Interactive Patient Education  2017 [place] Prevention in the Home Falls can cause injuries. They can happen to people of all ages. There are many things you can do to make your home safe and to help prevent falls. What can I do on the outside of my home?  Regularly fix the edges of walkways and driveways and fix any cracks.  Remove anything that might make you trip as you walk through a door, such as a raised step or threshold.  Trim any bushes or trees on the path to your home.  Use bright outdoor lighting.  Clear any walking paths of anything that might make someone trip, such as rocks or tools.  Regularly check to see if handrails are loose or broken. Make sure that both sides of any steps have handrails.  Any raised decks and porches should have guardrails on the edges.  Have any leaves, snow, or ice cleared regularly.  Use sand or salt on walking paths during winter.  Clean up any spills in your garage right away. This includes oil or grease spills. What can I do in the bathroom?  Use night lights.  Install grab bars by the toilet and in the tub and shower. Do not use towel bars as grab bars.  Use non-skid mats or decals in the tub or shower.  If you need to sit down in the shower, use a plastic, non-slip stool.  Keep the floor dry. Clean up any water that  spills on the floor as soon as it happens.  Remove soap buildup in the tub or shower regularly.  Attach bath mats securely with double-sided non-slip rug tape.  Do not have throw rugs and other things on the floor that can make you trip. What can I do in the bedroom?  Use night lights.  Make sure that you have a light by your bed that is easy to reach.  Do not use any sheets or blankets that are too big for your bed. They should not hang down onto the floor.  Have a firm chair that has side arms. You can use this for support while you get dressed.  Do not have throw rugs and other things on the floor that can make you trip. What can I do in the kitchen?  Clean up any spills right away.  Avoid walking on wet floors.  Keep items that you use a lot in easy-to-reach places.  If you need to reach something above you, use a strong step stool that has a grab bar.  Keep electrical cords out of the way.  Do not use floor polish or wax that makes floors slippery. If you must use wax, use non-skid floor wax.  Do  not have throw rugs and other things on the floor that can make you trip. What can I do with my stairs?  Do not leave any items on the stairs.  Make sure that there are handrails on both sides of the stairs and use them. Fix handrails that are broken or loose. Make sure that handrails are as long as the stairways.  Check any carpeting to make sure that it is firmly attached to the stairs. Fix any carpet that is loose or worn.  Avoid having throw rugs at the top or bottom of the stairs. If you do have throw rugs, attach them to the floor with carpet tape.  Make sure that you have a light switch at the top of the stairs and the bottom of the stairs. If you do not have them, ask someone to add them for you. What else can I do to help prevent falls?  Wear shoes that:  Do not have high heels.  Have rubber bottoms.  Are comfortable and fit you well.  Are closed at the  toe. Do not wear sandals.  If you use a stepladder:  Make sure that it is fully opened. Do not climb a closed stepladder.  Make sure that both sides of the stepladder are locked into place.  Ask someone to hold it for you, if possible.  Clearly mark and make sure that you can see:  Any grab bars or handrails.  First and last steps.  Where the edge of each step is.  Use tools that help you move around (mobility aids) if they are needed. These include:  Canes.  Walkers.  Scooters.  Crutches.  Turn on the lights when you go into a dark area. Replace any light bulbs as soon as they burn out.  Set up your furniture so you have a clear path. Avoid moving your furniture around.  If any of your floors are uneven, fix them.  If there are any pets around you, be aware of where they are.  Review your medicines with your doctor. Some medicines can make you feel dizzy. This can increase your chance of falling. Ask your doctor what other things that you can do to help prevent falls. This information is not intended to replace advice given to you by your health care provider. Make sure you discuss any questions you have with your health care provider. Document Released: 01/31/2009 Document Revised: 09/12/2015 Document Reviewed: 05/11/2014 Elsevier Interactive Patient Education  2017 [employer].

## 2019-06-08 ENCOUNTER — Ambulatory Visit (INDEPENDENT_AMBULATORY_CARE_PROVIDER_SITE_OTHER): Payer: Medicare HMO | Admitting: Primary Care

## 2019-06-08 ENCOUNTER — Telehealth: Payer: Self-pay | Admitting: Primary Care

## 2019-06-08 ENCOUNTER — Encounter: Payer: Self-pay | Admitting: Primary Care

## 2019-06-08 ENCOUNTER — Other Ambulatory Visit: Payer: Self-pay

## 2019-06-08 VITALS — BP 119/76

## 2019-06-08 DIAGNOSIS — Z1211 Encounter for screening for malignant neoplasm of colon: Secondary | ICD-10-CM | POA: Diagnosis not present

## 2019-06-08 DIAGNOSIS — E785 Hyperlipidemia, unspecified: Secondary | ICD-10-CM | POA: Diagnosis not present

## 2019-06-08 DIAGNOSIS — I10 Essential (primary) hypertension: Secondary | ICD-10-CM

## 2019-06-08 DIAGNOSIS — E119 Type 2 diabetes mellitus without complications: Secondary | ICD-10-CM

## 2019-06-08 DIAGNOSIS — C50919 Malignant neoplasm of unspecified site of unspecified female breast: Secondary | ICD-10-CM | POA: Diagnosis not present

## 2019-06-08 DIAGNOSIS — Z0000 Encounter for general adult medical examination without abnormal findings: Secondary | ICD-10-CM | POA: Diagnosis not present

## 2019-06-08 NOTE — Assessment & Plan Note (Signed)
Immunizations UTD. Mammogram and bone density scan UTD. Colon cancer screening due, she opts for Cologuard again. Order placed.  Discussed the importance of a healthy diet and regular exercise in order for weight loss, and to reduce the risk of any potential medical problems.  Virtual exam today stable. Labs reviewed.

## 2019-06-08 NOTE — Assessment & Plan Note (Signed)
Compliant to atorvastatin, LDL at goal. Continue current regimen.

## 2019-06-08 NOTE — Telephone Encounter (Signed)
Monica Villanueva, I met with patient virtually for CPE. She needs Cologuard. I placed the order from home so I'm not sure if the paper will print. FYI.

## 2019-06-08 NOTE — Assessment & Plan Note (Signed)
Worse than 6 months ago, seems to be diet related. Long discussion regarding the need to reduce A1C to prevent micro and macrovascular complications.   She declines medication treatment as she wants to work on lifestyle changes. Given her age I did agree to this.  She will schedule eye exam. Managed on statin and ARB. Pneumonia vaccination UTD.  Repeat A1C in 3 months. Follow up in 6 months.

## 2019-06-08 NOTE — Progress Notes (Signed)
Subjective:    Patient ID: Monica Villanueva, female    DOB: 10/16/1945, 74 y.o.   MRN: [id number]  HPI  Virtual Visit via Video Note  I connected with Monica Villanueva on 06/08/19 at  8:20 AM EST by a video enabled telemedicine application and verified that I am speaking with the correct person using two identifiers.  Location: Patient: Home Provider: Home   I discussed the limitations of evaluation and management by telemedicine and the availability of in person appointments. The patient expressed understanding and agreed to proceed.  History of Present Illness:  Ms. Monica Villanueva is a 74 year old female who presents today for annual physical.  -Influenza: Completed this season  -Shingles: Completed Shingrix series -Pneumonia: Completed Prevnar in 2019, Pneumovax in 2020  Mammogram: Completed in July 2020 Dexa: Completed in July 2020 Colonoscopy: Completed Cologuard in 2018, due again. Hep C Screen: negative   1) Type 2 Diabetes:   Current medications include: None  She is checking her blood glucose 0 times daily.  Last A1C: 6.5 in August 2020, 7.8 in February 2021 Last Eye Exam: She will schedule. Last Foot Exam: Due Pneumonia Vaccination: 2020 ACE/ARB: losartan Statin: atorvastatin   She admits to eating more unhealthy over the last several months  including more bread/starches. She is not as active as she was in the Summer and Fall. Plans on working on these measures.   2) Essential Hypertension: Currently managed on losartan 50 mg. Denies chest pain, headaches, dizziness.   BP Readings from Last 3 Encounters:  06/08/19 119/76  06/06/19 119/76  03/27/19 122/76   3) History of Breat Cancer: Following with oncology with last visit being in mid January 2021. Last mammogram in July 2020, due again this Summer. She is compliant to anastrozole 1 mg.    Observations/Objective:  Alert and oriented. Appears well, not sickly. No distress. Speaking in complete  sentences.   Assessment and Plan:  See problem based charting.  Follow Up Instructions:  Complete the cologuard kit once received.  It is important that you improve your diet. Please limit carbohydrates in the form of white bread, rice, pasta, sweets, fast food, fried food, sugary drinks, etc. Increase your consumption of fresh fruits and vegetables, whole grains, lean protein.  Ensure you are consuming 64 ounces of water daily.  Schedule a lab appointment for 3 months for diabetes check. Schedule a visit with me for 6 months for diabetes check.  It was a pleasure to see you today! Allie Bossier, NP-C    I discussed the assessment and treatment plan with the patient. The patient was provided an opportunity to ask questions and all were answered. The patient agreed with the plan and demonstrated an understanding of the instructions.   The patient was advised to call back or seek an in-person evaluation if the symptoms worsen or if the condition fails to improve as anticipated.     Pleas Koch, NP    Review of Systems  Eyes: Negative for visual disturbance.  Respiratory: Negative for shortness of breath.   Cardiovascular: Negative for chest pain.  Gastrointestinal: Negative for diarrhea and nausea.  Neurological: Negative for dizziness and headaches.  Psychiatric/Behavioral: The patient is not nervous/anxious.        Past Medical History:  Diagnosis Date  . Breast cancer ([place]) 2018   right breast  . Cancer ([place])   . GERD (gastroesophageal reflux disease)    OCC  . Keloid of skin    right axilla  .  Shingles 01/23/2019     Social History   Socioeconomic History  . Marital status: Widowed    Spouse name: Not on file  . Number of children: Not on file  . Years of education: Not on file  . Highest education level: Not on file  Occupational History  . Not on file  Tobacco Use  . Smoking status: Never Smoker  . Smokeless tobacco: Never Used  Substance and  Sexual Activity  . Alcohol use: Yes    Comment: rare use  . Drug use: No  . Sexual activity: Not Currently  Other Topics Concern  . Not on file  Social History Narrative   Widowed.   2 children, 4 grandchildren.   Retired. Previously worked at [employer].   Enjoys writing, playing on the key boards.   Social Determinants of Health   Financial Resource Strain: Low Risk   . Difficulty of Paying Living Expenses: Not hard at all  Food Insecurity: No Food Insecurity  . Worried About Charity fundraiser in the Last Year: Never true  . Ran Out of Food in the Last Year: Never true  Transportation Needs: No Transportation Needs  . Lack of Transportation (Medical): No  . Lack of Transportation (Non-Medical): No  Physical Activity: Sufficiently Active  . Days of Exercise per Week: 7 days  . Minutes of Exercise per Session: 30 min  Stress: No Stress Concern Present  . Feeling of Stress : Not at all  Social Connections:   . Frequency of Communication with Friends and Family: Not on file  . Frequency of Social Gatherings with Friends and Family: Not on file  . Attends Religious Services: Not on file  . Active Member of Clubs or Organizations: Not on file  . Attends Archivist Meetings: Not on file  . Marital Status: Not on file  Intimate Partner Violence: Not At Risk  . Fear of Current or Ex-Partner: No  . Emotionally Abused: No  . Physically Abused: No  . Sexually Abused: No    Past Surgical History:  Procedure Laterality Date  . ABDOMINAL HYSTERECTOMY  1996  . BREAST BIOPSY Right 07/15/2016   stereo bx of calcs LOQ-positive  . BREAST BIOPSY Right 07/15/2016   [place] bx of mass-positve  . BREAST CYST ASPIRATION Left 07/15/2016  . COLONOSCOPY    . MASTECTOMY Right 2018  . SENTINEL NODE BIOPSY Right 08/11/2016   Procedure: SENTINEL NODE BIOPSY;  Surgeon: Leonie Green, MD;  Location: [facility] ORS;  Service: General;  Laterality: Right;  . SIMPLE  MASTECTOMY WITH AXILLARY SENTINEL NODE BIOPSY Right 08/11/2016   Procedure: SIMPLE MASTECTOMY;  Surgeon: Leonie Green, MD;  Location: [facility] ORS;  Service: General;  Laterality: Right;    Family History  Problem Relation Age of Onset  . Hypertension Mother   . Hypertension Sister   . Breast cancer Sister     No Known Allergies  Current Outpatient Medications on File Prior to Visit  Medication Sig Dispense Refill  . ACCU-CHEK FASTCLIX LANCETS MISC USE TO CHECK BLOOD GLUCOSE UP TO 4 TIMES DAILY 300 each 1  . ACCU-CHEK GUIDE test strip USE TO CHECK BLOOD GLUCOSE UP TO 4 TIMES DAILY 100 each 5  . anastrozole (ARIMIDEX) 1 MG tablet TAKE 1 TABLET BY MOUTH EVERY DAY 90 tablet 1  . atorvastatin (LIPITOR) 20 MG tablet TAKE 1 TABLET BY MOUTH AT BEDTIME FOR CHOLESTEROL. 90 tablet 1  . blood glucose meter kit and supplies  KIT Dispense based on patient and insurance preference. Use up to four times daily as directed. (FOR ICD-9 250.00, 250.01). 1 each 0  . calcium carbonate (TUMS - DOSED IN MG ELEMENTAL CALCIUM) 500 MG chewable tablet Chew 1 tablet by mouth as needed for indigestion or heartburn.    . Calcium-Magnesium-Vitamin D (CALCIUM 1200+D3 PO) Take 2 tablets by mouth daily. Calcium 1200 units / Vit D 800 mg    . COLLAGEN PO Take 1 capsule by mouth daily.     Javier Docker Oil (OMEGA-3) 500 MG CAPS Take 1 capsule by mouth daily.    Marland Kitchen losartan (COZAAR) 50 MG tablet Take 1 tablet (50 mg total) by mouth daily. For blood pressure. 90 tablet 3  . Multiple Vitamins-Minerals (HAIR SKIN AND NAILS FORMULA) TABS Take 1 tablet by mouth daily.      No current facility-administered medications on file prior to visit.    BP 119/76    Objective:   Physical Exam  Constitutional: She is oriented to person, place, and time. She appears well-nourished.  Respiratory: Effort normal.  Neurological: She is alert and oriented to person, place, and time.  Psychiatric: She has a normal mood and affect.            Assessment & Plan:

## 2019-06-08 NOTE — Assessment & Plan Note (Signed)
Stable with home readings, compliant to losartan. Continue current regimen.  CMP reviewed.

## 2019-06-08 NOTE — Patient Instructions (Signed)
Complete the cologuard kit once received.  It is important that you improve your diet. Please limit carbohydrates in the form of white bread, rice, pasta, sweets, fast food, fried food, sugary drinks, etc. Increase your consumption of fresh fruits and vegetables, whole grains, lean protein.  Ensure you are consuming 64 ounces of water daily.  Schedule a lab appointment for 3 months for diabetes check. Schedule a visit with me for 6 months for diabetes check.  It was a pleasure to see you today! Allie Bossier, NP-C

## 2019-06-08 NOTE — Assessment & Plan Note (Signed)
Following with oncology, compliant to anastrozole. Mammogram UTD and due in July 2021.

## 2019-06-09 NOTE — Telephone Encounter (Signed)
Noted. Form sign and faxed.

## 2019-06-15 DIAGNOSIS — Z1212 Encounter for screening for malignant neoplasm of rectum: Secondary | ICD-10-CM | POA: Diagnosis not present

## 2019-06-15 DIAGNOSIS — Z1211 Encounter for screening for malignant neoplasm of colon: Secondary | ICD-10-CM | POA: Diagnosis not present

## 2019-06-20 LAB — COLOGUARD: Cologuard: NEGATIVE

## 2019-06-23 ENCOUNTER — Encounter: Payer: Self-pay | Admitting: Primary Care

## 2019-06-23 LAB — COLOGUARD: COLOGUARD: NEGATIVE

## 2019-08-24 ENCOUNTER — Other Ambulatory Visit: Payer: Self-pay | Admitting: Primary Care

## 2019-08-24 DIAGNOSIS — E119 Type 2 diabetes mellitus without complications: Secondary | ICD-10-CM

## 2019-09-05 ENCOUNTER — Other Ambulatory Visit: Payer: Self-pay

## 2019-09-05 ENCOUNTER — Other Ambulatory Visit (INDEPENDENT_AMBULATORY_CARE_PROVIDER_SITE_OTHER): Payer: Medicare HMO

## 2019-09-05 DIAGNOSIS — E119 Type 2 diabetes mellitus without complications: Secondary | ICD-10-CM | POA: Diagnosis not present

## 2019-09-05 LAB — POCT GLYCOSYLATED HEMOGLOBIN (HGB A1C): Hemoglobin A1C: 6 % — AB (ref 4.0–5.6)

## 2019-09-08 ENCOUNTER — Encounter: Payer: Self-pay | Admitting: *Deleted

## 2019-09-11 ENCOUNTER — Other Ambulatory Visit: Payer: Self-pay | Admitting: Oncology

## 2019-10-10 ENCOUNTER — Telehealth: Payer: Self-pay

## 2019-10-10 DIAGNOSIS — E119 Type 2 diabetes mellitus without complications: Secondary | ICD-10-CM

## 2019-10-10 NOTE — Telephone Encounter (Signed)
Last eye exam 2019... has not scheduled an eye exam.... pt would like a referral as the previous visit at [facility] was not in network for her insurance

## 2019-10-10 NOTE — Addendum Note (Signed)
Addended by: Pleas Koch on: 10/10/2019 03:55 PM   Modules accepted: Orders

## 2019-10-10 NOTE — Telephone Encounter (Signed)
Noted, referral placed.  

## 2019-11-06 ENCOUNTER — Other Ambulatory Visit: Payer: Self-pay | Admitting: *Deleted

## 2019-11-06 ENCOUNTER — Inpatient Hospital Stay: Payer: Medicare HMO | Attending: Oncology | Admitting: Oncology

## 2019-11-06 ENCOUNTER — Encounter: Payer: Self-pay | Admitting: Oncology

## 2019-11-06 DIAGNOSIS — Z79811 Long term (current) use of aromatase inhibitors: Secondary | ICD-10-CM | POA: Diagnosis not present

## 2019-11-06 DIAGNOSIS — Z08 Encounter for follow-up examination after completed treatment for malignant neoplasm: Secondary | ICD-10-CM

## 2019-11-06 DIAGNOSIS — Z5181 Encounter for therapeutic drug level monitoring: Secondary | ICD-10-CM | POA: Diagnosis not present

## 2019-11-06 DIAGNOSIS — Z853 Personal history of malignant neoplasm of breast: Secondary | ICD-10-CM

## 2019-11-06 NOTE — Progress Notes (Signed)
Patient called/ pre- screened for virtual appoinment today with oncologist. No concerns or complaints. 

## 2019-11-06 NOTE — Progress Notes (Signed)
I connected with Monica Villanueva on 11/06/19 at  1:15 PM EDT by video enabled telemedicine visit and verified that I am speaking with the correct person using two identifiers.   I discussed the limitations, risks, security and privacy concerns of performing an evaluation and management service by telemedicine and the availability of in-person appointments. I also discussed with the patient that there may be a patient responsible charge related to this service. The patient expressed understanding and agreed to proceed.  Other persons participating in the visit and their role in the encounter:  none  Patient's location:  home Provider's location:  Work  Diagnosis-2 foci of invasive breast cancer in the right breast Stage I pT1c pn1(mi)sn cM0 ER/PR positive and Her2 negatives/p mastectomy   Chief Complaint: Routine follow-up of breast cancer on Arimidex  History of present illness: Patient is a 74 year old female with history of invasive lobular carcinoma of the right breast status post right simple mastectomy and sentinel lymph node biopsy in April 2018. PT1CPN1 mi. MammaPrint score came back at low risk and she did not require adjuvant chemotherapy. She is currently on Arimidex which was started in May 2018.  She did not require adjuvant radiation treatment following mastectomy   Interval history patient reports tolerating Arimidex well along with calcium and vitamin D without any significant side effects.  Appetite and weight is stable.  Denies any new aches or pains anywhere   Review of Systems  Constitutional: Negative for chills, fever, malaise/fatigue and weight loss.  HENT: Negative for congestion, ear discharge and nosebleeds.   Eyes: Negative for blurred vision.  Respiratory: Negative for cough, hemoptysis, sputum production, shortness of breath and wheezing.   Cardiovascular: Negative for chest pain, palpitations, orthopnea and claudication.  Gastrointestinal: Negative for  abdominal pain, blood in stool, constipation, diarrhea, heartburn, melena, nausea and vomiting.  Genitourinary: Negative for dysuria, flank pain, frequency, hematuria and urgency.  Musculoskeletal: Negative for back pain, joint pain and myalgias.  Skin: Negative for rash.  Neurological: Negative for dizziness, tingling, focal weakness, seizures, weakness and headaches.  Endo/Heme/Allergies: Does not bruise/bleed easily.  Psychiatric/Behavioral: Negative for depression and suicidal ideas. The patient does not have insomnia.     No Known Allergies  Past Medical History:  Diagnosis Date  . Breast cancer ([place]) 2018   right breast  . Cancer ([place])   . GERD (gastroesophageal reflux disease)    OCC  . Keloid of skin    right axilla  . Shingles 01/23/2019    Past Surgical History:  Procedure Laterality Date  . ABDOMINAL HYSTERECTOMY  1996  . BREAST BIOPSY Right 07/15/2016   stereo bx of calcs LOQ-positive  . BREAST BIOPSY Right 07/15/2016   [place] bx of mass-positve  . BREAST CYST ASPIRATION Left 07/15/2016  . COLONOSCOPY    . MASTECTOMY Right 2018  . SENTINEL NODE BIOPSY Right 08/11/2016   Procedure: SENTINEL NODE BIOPSY;  Surgeon: Leonie Green, MD;  Location: [facility] ORS;  Service: General;  Laterality: Right;  . SIMPLE MASTECTOMY WITH AXILLARY SENTINEL NODE BIOPSY Right 08/11/2016   Procedure: SIMPLE MASTECTOMY;  Surgeon: Leonie Green, MD;  Location: [facility] ORS;  Service: General;  Laterality: Right;    Social History   Socioeconomic History  . Marital status: Widowed    Spouse name: Not on file  . Number of children: Not on file  . Years of education: Not on file  . Highest education level: Not on file  Occupational History  . Not on file  Tobacco Use  . Smoking status: Never Smoker  . Smokeless tobacco: Never Used  Vaping Use  . Vaping Use: Never used  Substance and Sexual Activity  . Alcohol use: Yes    Comment: rare use  . Drug use: No  . Sexual activity: Not  Currently  Other Topics Concern  . Not on file  Social History Narrative   Widowed.   2 children, 4 grandchildren.   Retired. Previously worked at [employer].   Enjoys writing, playing on the key boards.   Social Determinants of Health   Financial Resource Strain: Low Risk   . Difficulty of Paying Living Expenses: Not hard at all  Food Insecurity: No Food Insecurity  . Worried About Charity fundraiser in the Last Year: Never true  . Ran Out of Food in the Last Year: Never true  Transportation Needs: No Transportation Needs  . Lack of Transportation (Medical): No  . Lack of Transportation (Non-Medical): No  Physical Activity: Sufficiently Active  . Days of Exercise per Week: 7 days  . Minutes of Exercise per Session: 30 min  Stress: No Stress Concern Present  . Feeling of Stress : Not at all  Social Connections:   . Frequency of Communication with Friends and Family:   . Frequency of Social Gatherings with Friends and Family:   . Attends Religious Services:   . Active Member of Clubs or Organizations:   . Attends Archivist Meetings:   Marland Kitchen Marital Status:   Intimate Partner Violence: Not At Risk  . Fear of Current or Ex-Partner: No  . Emotionally Abused: No  . Physically Abused: No  . Sexually Abused: No    Family History  Problem Relation Age of Onset  . Hypertension Mother   . Hypertension Sister   . Breast cancer Sister      Current Outpatient Medications:  .  ACCU-CHEK FASTCLIX LANCETS MISC, USE TO CHECK BLOOD GLUCOSE UP TO 4 TIMES DAILY, Disp: 300 each, Rfl: 1 .  ACCU-CHEK GUIDE test strip, USE TO CHECK BLOOD GLUCOSE UP TO 4 TIMES DAILY, Disp: 100 each, Rfl: 5 .  anastrozole (ARIMIDEX) 1 MG tablet, TAKE 1 TABLET EVERY DAY, Disp: 90 tablet, Rfl: 1 .  atorvastatin (LIPITOR) 20 MG tablet, TAKE 1 TABLET BY MOUTH AT BEDTIME FOR CHOLESTEROL., Disp: 90 tablet, Rfl: 1 .  blood glucose meter kit and supplies KIT, Dispense based on patient and  insurance preference. Use up to four times daily as directed. (FOR ICD-9 250.00, 250.01)., Disp: 1 each, Rfl: 0 .  calcium carbonate (OSCAL) 1500 (600 Ca) MG TABS tablet, Take by mouth., Disp: , Rfl:  .  calcium carbonate (TUMS - DOSED IN MG ELEMENTAL CALCIUM) 500 MG chewable tablet, Chew 1 tablet by mouth as needed for indigestion or heartburn., Disp: , Rfl:  .  Calcium-Magnesium-Vitamin D (CALCIUM 1200+D3 PO), Take 2 tablets by mouth daily. Calcium 1200 units / Vit D 800 mg, Disp: , Rfl:  .  COLLAGEN PO, Take 1 capsule by mouth daily. , Disp: , Rfl:  .  Krill Oil (OMEGA-3) 500 MG CAPS, Take 1 capsule by mouth daily., Disp: , Rfl:  .  losartan (COZAAR) 50 MG tablet, Take 1 tablet (50 mg total) by mouth daily. For blood pressure., Disp: 90 tablet, Rfl: 3 .  Multiple Vitamins-Minerals (HAIR SKIN AND NAILS FORMULA) TABS, Take 1 tablet by mouth daily. , Disp: , Rfl:   No results found.  No images are attached to  the encounter.   CMP Latest Ref Rng & Units 06/05/2019  Glucose 70 - 99 mg/dL 210(H)  BUN 6 - 23 mg/dL 18  Creatinine 0.40 - 1.20 mg/dL 0.85  Sodium 135 - 145 mEq/L 137  Potassium 3.5 - 5.1 mEq/L 3.3(L)  Chloride 96 - 112 mEq/L 102  CO2 19 - 32 mEq/L 27  Calcium 8.4 - 10.5 mg/dL 9.2  Total Protein 6.0 - 8.3 g/dL 7.6  Total Bilirubin 0.2 - 1.2 mg/dL 0.5  Alkaline Phos 39 - 117 U/L 90  AST 0 - 37 U/L 13  ALT 0 - 35 U/L 9   CBC Latest Ref Rng & Units 06/05/2019  WBC 4.0 - 10.5 K/uL 8.0  Hemoglobin 12.0 - 15.0 g/dL 11.9(L)  Hematocrit 36 - 46 % 36.1  Platelets 150 - 400 K/uL 200.0     Observation/objective: Appears in no acute distress over video visit today.  Breathing is nonlabored  Assessment and plan: Patient is a 74 year old female with history of stage I invasive mammary carcinoma of the right breast ER/PR positive HER-2/neu negative s/p right mastectomy currently on Arimidex and this is a routine follow-up visit  Clinically patient is doing well with no concerning  symptoms of recurrence based on today's visit.  She is due for a mammogram this month which I will schedule.  She will continue to take Arimidex along with calcium and vitamin D.  Follow-up instructions: I will see her back in 6 months with CBC with differential and CMP in person  I discussed the assessment and treatment plan with the patient. The patient was provided an opportunity to ask questions and all were answered. The patient agreed with the plan and demonstrated an understanding of the instructions.   The patient was advised to call back or seek an in-person evaluation if the symptoms worsen or if the condition fails to improve as anticipated.    Visit Diagnosis: 1. Visit for monitoring Arimidex therapy   2. Encounter for follow-up surveillance of breast cancer     Dr. Randa Evens, MD, MPH [facility] at [facility] Tel- [phone] 11/06/2019 1:12 PM

## 2019-11-22 ENCOUNTER — Ambulatory Visit
Admission: RE | Admit: 2019-11-22 | Discharge: 2019-11-22 | Disposition: A | Discharge status: Home / Self Care | Payer: Medicare HMO | Source: Ambulatory Visit | Attending: Oncology | Admitting: Oncology

## 2019-11-22 DIAGNOSIS — Z1231 Encounter for screening mammogram for malignant neoplasm of breast: Secondary | ICD-10-CM | POA: Diagnosis not present

## 2019-11-22 DIAGNOSIS — Z08 Encounter for follow-up examination after completed treatment for malignant neoplasm: Secondary | ICD-10-CM

## 2019-11-29 DIAGNOSIS — E1136 Type 2 diabetes mellitus with diabetic cataract: Secondary | ICD-10-CM | POA: Diagnosis not present

## 2019-11-29 DIAGNOSIS — E119 Type 2 diabetes mellitus without complications: Secondary | ICD-10-CM | POA: Diagnosis not present

## 2019-11-29 LAB — HM DIABETES EYE EXAM

## 2019-12-06 ENCOUNTER — Ambulatory Visit (INDEPENDENT_AMBULATORY_CARE_PROVIDER_SITE_OTHER): Payer: Medicare HMO | Admitting: Primary Care

## 2019-12-06 ENCOUNTER — Other Ambulatory Visit: Payer: Self-pay

## 2019-12-06 ENCOUNTER — Encounter: Payer: Self-pay | Admitting: Primary Care

## 2019-12-06 VITALS — BP 130/84 | HR 60 | Temp 95.8°F | Ht 62.0 in | Wt 205.0 lb

## 2019-12-06 DIAGNOSIS — E119 Type 2 diabetes mellitus without complications: Secondary | ICD-10-CM

## 2019-12-06 LAB — POCT GLYCOSYLATED HEMOGLOBIN (HGB A1C): Hemoglobin A1C: 5.4 % (ref 4.0–5.6)

## 2019-12-06 NOTE — Progress Notes (Signed)
Subjective:    Patient ID: Monica Villanueva, female    DOB: March 03, 1946, 74 y.o.   MRN: [id number]  HPI  This visit occurred during the SARS-CoV-2 public health emergency.  Safety protocols were in place, including screening questions prior to the visit, additional usage of staff PPE, and extensive cleaning of exam room while observing appropriate contact time as indicated for disinfecting solutions.   Monica Villanueva is a 74 year old female with a history of hypertension, diabetes, breast cancer, hyperlipidemia who presents today for follow up of diabetes.  Current diabetes medications include: None  She is checking her blood glucose 0 times daily.   Last A1C: 6.0 in May 2021, 5.4 today Last Eye Exam: UTD Last Foot Exam: Due Pneumonia Vaccination: Completed in 2020 ACE/ARB: losartan  Statin: atorvastatin   BP Readings from Last 3 Encounters:  12/06/19 130/84  06/08/19 119/76  06/06/19 119/76   Wt Readings from Last 3 Encounters:  12/06/19 205 lb (93 kg)  06/06/19 112 lb (50.8 kg)  03/27/19 214 lb 4 oz (97.2 kg)     She endorses a healthy diet, has stopped eating "beef", stopped drinking sodas and juices. She's been eating more chicken and fish, both of which are baked.   Review of Systems  Eyes: Negative for visual disturbance.  Respiratory: Negative for shortness of breath.   Cardiovascular: Negative for chest pain.  Neurological: Negative for dizziness and numbness.       Past Medical History:  Diagnosis Date  . Breast cancer ([place]) 2018   right breast  . Cancer ([place])   . GERD (gastroesophageal reflux disease)    OCC  . Keloid of skin    right axilla  . Shingles 01/23/2019     Social History   Socioeconomic History  . Marital status: Widowed    Spouse name: Not on file  . Number of children: Not on file  . Years of education: Not on file  . Highest education level: Not on file  Occupational History  . Not on file  Tobacco Use  . Smoking status: Never Smoker    . Smokeless tobacco: Never Used  Vaping Use  . Vaping Use: Never used  Substance and Sexual Activity  . Alcohol use: Yes    Comment: rare use  . Drug use: No  . Sexual activity: Not Currently  Other Topics Concern  . Not on file  Social History Narrative   Widowed.   2 children, 4 grandchildren.   Retired. Previously worked at [employer].   Enjoys writing, playing on the key boards.   Social Determinants of Health   Financial Resource Strain: Low Risk   . Difficulty of Paying Living Expenses: Not hard at all  Food Insecurity: No Food Insecurity  . Worried About Charity fundraiser in the Last Year: Never true  . Ran Out of Food in the Last Year: Never true  Transportation Needs: No Transportation Needs  . Lack of Transportation (Medical): No  . Lack of Transportation (Non-Medical): No  Physical Activity: Sufficiently Active  . Days of Exercise per Week: 7 days  . Minutes of Exercise per Session: 30 min  Stress: No Stress Concern Present  . Feeling of Stress : Not at all  Social Connections:   . Frequency of Communication with Friends and Family:   . Frequency of Social Gatherings with Friends and Family:   . Attends Religious Services:   . Active Member of Clubs or Organizations:   .  Attends Archivist Meetings:   Marland Kitchen Marital Status:   Intimate Partner Violence: Not At Risk  . Fear of Current or Ex-Partner: No  . Emotionally Abused: No  . Physically Abused: No  . Sexually Abused: No    Past Surgical History:  Procedure Laterality Date  . ABDOMINAL HYSTERECTOMY  1996  . BREAST BIOPSY Right 07/15/2016   stereo bx of calcs LOQ-positive  . BREAST BIOPSY Right 07/15/2016   [place] bx of mass-positve  . BREAST CYST ASPIRATION Left 07/15/2016  . COLONOSCOPY    . MASTECTOMY Right 2018  . SENTINEL NODE BIOPSY Right 08/11/2016   Procedure: SENTINEL NODE BIOPSY;  Surgeon: Leonie Green, MD;  Location: [facility] ORS;  Service: General;  Laterality:  Right;  . SIMPLE MASTECTOMY WITH AXILLARY SENTINEL NODE BIOPSY Right 08/11/2016   Procedure: SIMPLE MASTECTOMY;  Surgeon: Leonie Green, MD;  Location: [facility] ORS;  Service: General;  Laterality: Right;    Family History  Problem Relation Age of Onset  . Hypertension Mother   . Hypertension Sister   . Breast cancer Sister     No Known Allergies  Current Outpatient Medications on File Prior to Visit  Medication Sig Dispense Refill  . anastrozole (ARIMIDEX) 1 MG tablet TAKE 1 TABLET EVERY DAY 90 tablet 1  . atorvastatin (LIPITOR) 20 MG tablet TAKE 1 TABLET BY MOUTH AT BEDTIME FOR CHOLESTEROL. 90 tablet 1  . calcium carbonate (OSCAL) 1500 (600 Ca) MG TABS tablet Take by mouth.    . calcium carbonate (TUMS - DOSED IN MG ELEMENTAL CALCIUM) 500 MG chewable tablet Chew 1 tablet by mouth as needed for indigestion or heartburn.    . Calcium-Magnesium-Vitamin D (CALCIUM 1200+D3 PO) Take 2 tablets by mouth daily. Calcium 1200 units / Vit D 800 mg    . COLLAGEN PO Take 1 capsule by mouth daily.     Javier Docker Oil (OMEGA-3) 500 MG CAPS Take 1 capsule by mouth daily.    Marland Kitchen losartan (COZAAR) 50 MG tablet Take 1 tablet (50 mg total) by mouth daily. For blood pressure. 90 tablet 3  . Multiple Vitamins-Minerals (HAIR SKIN AND NAILS FORMULA) TABS Take 1 tablet by mouth daily.     Marland Kitchen ACCU-CHEK FASTCLIX LANCETS MISC USE TO CHECK BLOOD GLUCOSE UP TO 4 TIMES DAILY (Patient not taking: Reported on 12/06/2019) 300 each 1  . ACCU-CHEK GUIDE test strip USE TO CHECK BLOOD GLUCOSE UP TO 4 TIMES DAILY (Patient not taking: Reported on 12/06/2019) 100 each 5  . blood glucose meter kit and supplies KIT Dispense based on patient and insurance preference. Use up to four times daily as directed. (FOR ICD-9 250.00, 250.01). (Patient not taking: Reported on 12/06/2019) 1 each 0   No current facility-administered medications on file prior to visit.    BP 130/84   Pulse 60   Temp (!) 95.8 F (35.4 C) (Temporal)   Ht $R'5\' 2"'tm$   (1.575 m)   Wt 205 lb (93 kg)   SpO2 98%   BMI 37.49 kg/m    Objective:   Physical Exam Cardiovascular:     Rate and Rhythm: Normal rate and regular rhythm.  Pulmonary:     Effort: Pulmonary effort is normal.     Breath sounds: Normal breath sounds.  Musculoskeletal:     Cervical back: Neck supple.  Skin:    General: Skin is warm and dry.  Psychiatric:        Mood and Affect: Mood normal.  Assessment & Plan:

## 2019-12-06 NOTE — Patient Instructions (Addendum)
Continue to work on a healthy diet.   Continue exercising. You should be getting 150 minutes of moderate intensity exercise weekly.  Schedule your physical with me and medicare visit with our nurse for February 2022.  It was a pleasure to see you today!

## 2019-12-06 NOTE — Assessment & Plan Note (Signed)
Very well controlled with A1C of 5.4 off meds! Commended her on dietary changes, encouraged regular exercise.  Foot exam today. Eye exam UTD. Pneumonia vaccination UTD. Managed on ARB and statin.  Follow up in 6 months.

## 2019-12-08 ENCOUNTER — Encounter: Payer: Self-pay | Admitting: Primary Care

## 2020-04-15 ENCOUNTER — Other Ambulatory Visit: Payer: Self-pay | Admitting: Oncology

## 2020-05-08 NOTE — Progress Notes (Signed)
Hematology/Oncology Consult note [facility]  Telephone:([phone] Fax:([phone]) [phone]  Patient Care Team: Pleas Koch, NP as PCP - General (Internal Medicine)   Name of the patient: Monica Villanueva  [id number]  [birth year]   Date of visit: 05/08/20  Diagnosis- 2 foci of invasive breast cancer in the right breast Stage I pT1c pn1(mi)sn cM0 ER/PR positive and Her2 negatives/p mastectomy  Chief complaint/ Reason for visit-routine follow-up of breast cancer  Heme/Onc history: Patient is a 75 year old female with history of invasive lobular carcinoma of the right breast status post right simple mastectomy and sentinel lymph node biopsy in April 2018. PT1CPN1 mi. MammaPrint score came back at low risk and she did not require adjuvant chemotherapy. She is currently on Arimidex which was started in May 2018.  She did not require adjuvant radiation treatment following mastectomy   Interval history-patient reports occasional pain along her right chest wall which happens infrequently.  Appetite and weight is stable.  Denies any new aches and pains anywhere else  ECOG PS- 1 Pain scale- 0 Opioid associated constipation- no  Review of systems- Review of Systems  Constitutional: Negative for chills, fever, malaise/fatigue and weight loss.  HENT: Negative for congestion, ear discharge and nosebleeds.   Eyes: Negative for blurred vision.  Respiratory: Negative for cough, hemoptysis, sputum production, shortness of breath and wheezing.   Cardiovascular: Negative for chest pain, palpitations, orthopnea and claudication.  Gastrointestinal: Negative for abdominal pain, blood in stool, constipation, diarrhea, heartburn, melena, nausea and vomiting.  Genitourinary: Negative for dysuria, flank pain, frequency, hematuria and urgency.  Musculoskeletal: Negative for back pain, joint pain and myalgias.  Skin: Negative for rash.  Neurological: Negative for dizziness,  tingling, focal weakness, seizures, weakness and headaches.  Endo/Heme/Allergies: Does not bruise/bleed easily.  Psychiatric/Behavioral: Negative for depression and suicidal ideas. The patient does not have insomnia.        No Known Allergies   Past Medical History:  Diagnosis Date  . Breast cancer ([place]) 2018   right breast  . Cancer ([place])   . GERD (gastroesophageal reflux disease)    OCC  . Keloid of skin    right axilla  . Shingles 01/23/2019     Past Surgical History:  Procedure Laterality Date  . ABDOMINAL HYSTERECTOMY  1996  . BREAST BIOPSY Right 07/15/2016   stereo bx of calcs LOQ-positive  . BREAST BIOPSY Right 07/15/2016   [place] bx of mass-positve  . BREAST CYST ASPIRATION Left 07/15/2016  . COLONOSCOPY    . MASTECTOMY Right 2018  . SENTINEL NODE BIOPSY Right 08/11/2016   Procedure: SENTINEL NODE BIOPSY;  Surgeon: Leonie Green, MD;  Location: [facility] ORS;  Service: General;  Laterality: Right;  . SIMPLE MASTECTOMY WITH AXILLARY SENTINEL NODE BIOPSY Right 08/11/2016   Procedure: SIMPLE MASTECTOMY;  Surgeon: Leonie Green, MD;  Location: [facility] ORS;  Service: General;  Laterality: Right;    Social History   Socioeconomic History  . Marital status: Widowed    Spouse name: Not on file  . Number of children: Not on file  . Years of education: Not on file  . Highest education level: Not on file  Occupational History  . Not on file  Tobacco Use  . Smoking status: Never Smoker  . Smokeless tobacco: Never Used  Vaping Use  . Vaping Use: Never used  Substance and Sexual Activity  . Alcohol use: Yes    Comment: rare use  . Drug use: No  . Sexual  activity: Not Currently  Other Topics Concern  . Not on file  Social History Narrative   Widowed.   2 children, 4 grandchildren.   Retired. Previously worked at [employer].   Enjoys writing, playing on the key boards.   Social Determinants of Health   Financial Resource Strain: Low Risk    . Difficulty of Paying Living Expenses: Not hard at all  Food Insecurity: No Food Insecurity  . Worried About Charity fundraiser in the Last Year: Never true  . Ran Out of Food in the Last Year: Never true  Transportation Needs: No Transportation Needs  . Lack of Transportation (Medical): No  . Lack of Transportation (Non-Medical): No  Physical Activity: Sufficiently Active  . Days of Exercise per Week: 7 days  . Minutes of Exercise per Session: 30 min  Stress: No Stress Concern Present  . Feeling of Stress : Not at all  Social Connections: Not on file  Intimate Partner Violence: Not At Risk  . Fear of Current or Ex-Partner: No  . Emotionally Abused: No  . Physically Abused: No  . Sexually Abused: No    Family History  Problem Relation Age of Onset  . Hypertension Mother   . Hypertension Sister   . Breast cancer Sister      Current Outpatient Medications:  .  ACCU-CHEK FASTCLIX LANCETS MISC, USE TO CHECK BLOOD GLUCOSE UP TO 4 TIMES DAILY (Patient not taking: Reported on 12/06/2019), Disp: 300 each, Rfl: 1 .  ACCU-CHEK GUIDE test strip, USE TO CHECK BLOOD GLUCOSE UP TO 4 TIMES DAILY (Patient not taking: Reported on 12/06/2019), Disp: 100 each, Rfl: 5 .  anastrozole (ARIMIDEX) 1 MG tablet, TAKE 1 TABLET EVERY DAY, Disp: 90 tablet, Rfl: 1 .  atorvastatin (LIPITOR) 20 MG tablet, TAKE 1 TABLET BY MOUTH AT BEDTIME FOR CHOLESTEROL., Disp: 90 tablet, Rfl: 1 .  blood glucose meter kit and supplies KIT, Dispense based on patient and insurance preference. Use up to four times daily as directed. (FOR ICD-9 250.00, 250.01). (Patient not taking: Reported on 12/06/2019), Disp: 1 each, Rfl: 0 .  calcium carbonate (OSCAL) 1500 (600 Ca) MG TABS tablet, Take by mouth., Disp: , Rfl:  .  calcium carbonate (TUMS - DOSED IN MG ELEMENTAL CALCIUM) 500 MG chewable tablet, Chew 1 tablet by mouth as needed for indigestion or heartburn., Disp: , Rfl:  .  Calcium-Magnesium-Vitamin D (CALCIUM 1200+D3 PO), Take  2 tablets by mouth daily. Calcium 1200 units / Vit D 800 mg, Disp: , Rfl:  .  COLLAGEN PO, Take 1 capsule by mouth daily. , Disp: , Rfl:  .  Krill Oil (OMEGA-3) 500 MG CAPS, Take 1 capsule by mouth daily., Disp: , Rfl:  .  losartan (COZAAR) 50 MG tablet, Take 1 tablet (50 mg total) by mouth daily. For blood pressure., Disp: 90 tablet, Rfl: 3 .  Multiple Vitamins-Minerals (HAIR SKIN AND NAILS FORMULA) TABS, Take 1 tablet by mouth daily. , Disp: , Rfl:   Physical exam:  Vitals:   05/09/20 1000  BP: 136/78  Pulse: (!) 58  Resp: 16  Temp: 97.6 F (36.4 C)  TempSrc: Tympanic  SpO2: 98%  Weight: 206 lb (93.4 kg)   Physical Exam Constitutional:      General: She is not in acute distress. Eyes:     Extraocular Movements: EOM normal.  Cardiovascular:     Rate and Rhythm: Normal rate and regular rhythm.     Heart sounds: Normal heart sounds.  Pulmonary:     Effort: Pulmonary effort is normal.     Breath sounds: Normal breath sounds.  Abdominal:     General: Bowel sounds are normal.     Palpations: Abdomen is soft.  Skin:    General: Skin is warm and dry.  Neurological:     Mental Status: She is alert and oriented to person, place, and time.   Breast exam: Patient is s/p right mastectomy without reconstruction and with a well-healed surgical scar.  No evidence of chest wall recurrence.  No palpable bilateral axillary adenopathy.  No palpable masses in the left breast  CMP Latest Ref Rng & Units 06/05/2019  Glucose 70 - 99 mg/dL 210(H)  BUN 6 - 23 mg/dL 18  Creatinine 0.40 - 1.20 mg/dL 0.85  Sodium 135 - 145 mEq/L 137  Potassium 3.5 - 5.1 mEq/L 3.3(L)  Chloride 96 - 112 mEq/L 102  CO2 19 - 32 mEq/L 27  Calcium 8.4 - 10.5 mg/dL 9.2  Total Protein 6.0 - 8.3 g/dL 7.6  Total Bilirubin 0.2 - 1.2 mg/dL 0.5  Alkaline Phos 39 - 117 U/L 90  AST 0 - 37 U/L 13  ALT 0 - 35 U/L 9   CBC Latest Ref Rng & Units 06/05/2019  WBC 4.0 - 10.5 K/uL 8.0  Hemoglobin 12.0 - 15.0 g/dL 11.9(L)   Hematocrit 36.0 - 46.0 % 36.1  Platelets 150.0 - 400.0 K/uL 200.0     Assessment and plan- Patient is a 75 y.o. female   with history of stage I invasive mammary carcinoma of the right breast ER/PR positive HER-2/neu negative s/p right mastectomy currently on Arimidex.  This is a routine follow-up visit for breast cancer  Clinically patient is doing well with no concerning signs and symptoms of recurrence based on today's exam.  Suspect occasional right mastectomy pain is neuropathic pain from prior surgery.  If the pain gets worse or remains persistent I will consider getting additional imaging at that time  Patient will continue taking Arimidex until 2023 along with calcium and vitamin D  I will see her back in 6 months for a video visit.  She will need a mammogram in August 2022 and a bone density scan in the next 3 to 4 months   Visit Diagnosis 1. Encounter for follow-up surveillance of breast cancer   2. Visit for monitoring Arimidex therapy      Dr. Randa Evens, MD, MPH [facility] [phone] 05/08/2020 12:03 PM

## 2020-05-09 ENCOUNTER — Encounter: Payer: Self-pay | Admitting: Oncology

## 2020-05-09 ENCOUNTER — Inpatient Hospital Stay: Payer: Medicare HMO | Attending: Oncology

## 2020-05-09 ENCOUNTER — Inpatient Hospital Stay (HOSPITAL_BASED_OUTPATIENT_CLINIC_OR_DEPARTMENT_OTHER): Payer: Medicare HMO | Admitting: Oncology

## 2020-05-09 VITALS — BP 136/78 | HR 58 | Temp 97.6°F | Resp 16 | Wt 206.0 lb

## 2020-05-09 DIAGNOSIS — Z170 Estrogen receptor positive status [ER+]: Secondary | ICD-10-CM | POA: Diagnosis not present

## 2020-05-09 DIAGNOSIS — Z5181 Encounter for therapeutic drug level monitoring: Secondary | ICD-10-CM

## 2020-05-09 DIAGNOSIS — Z90710 Acquired absence of both cervix and uterus: Secondary | ICD-10-CM | POA: Diagnosis not present

## 2020-05-09 DIAGNOSIS — Z853 Personal history of malignant neoplasm of breast: Secondary | ICD-10-CM

## 2020-05-09 DIAGNOSIS — Z803 Family history of malignant neoplasm of breast: Secondary | ICD-10-CM | POA: Diagnosis not present

## 2020-05-09 DIAGNOSIS — Z08 Encounter for follow-up examination after completed treatment for malignant neoplasm: Secondary | ICD-10-CM

## 2020-05-09 DIAGNOSIS — C50911 Malignant neoplasm of unspecified site of right female breast: Secondary | ICD-10-CM | POA: Insufficient documentation

## 2020-05-09 DIAGNOSIS — Z9011 Acquired absence of right breast and nipple: Secondary | ICD-10-CM | POA: Insufficient documentation

## 2020-05-09 DIAGNOSIS — Z79811 Long term (current) use of aromatase inhibitors: Secondary | ICD-10-CM

## 2020-05-09 LAB — CBC WITH DIFFERENTIAL/PLATELET
Abs Immature Granulocytes: 0.02 10*3/uL (ref 0.00–0.07)
Basophils Absolute: 0 10*3/uL (ref 0.0–0.1)
Basophils Relative: 1 %
Eosinophils Absolute: 0.1 10*3/uL (ref 0.0–0.5)
Eosinophils Relative: 1 %
HCT: 38 % (ref 36.0–46.0)
Hemoglobin: 12.7 g/dL (ref 12.0–15.0)
Immature Granulocytes: 0 %
Lymphocytes Relative: 40 %
Lymphs Abs: 3.2 10*3/uL (ref 0.7–4.0)
MCH: 31.2 pg (ref 26.0–34.0)
MCHC: 33.4 g/dL (ref 30.0–36.0)
MCV: 93.4 fL (ref 80.0–100.0)
Monocytes Absolute: 0.6 10*3/uL (ref 0.1–1.0)
Monocytes Relative: 7 %
Neutro Abs: 4.1 10*3/uL (ref 1.7–7.7)
Neutrophils Relative %: 51 %
Platelets: 188 10*3/uL (ref 150–400)
RBC: 4.07 MIL/uL (ref 3.87–5.11)
RDW: 11.5 % (ref 11.5–15.5)
WBC: 8.1 10*3/uL (ref 4.0–10.5)
nRBC: 0 % (ref 0.0–0.2)

## 2020-05-09 LAB — COMPREHENSIVE METABOLIC PANEL
ALT: 15 U/L (ref 0–44)
AST: 19 U/L (ref 15–41)
Albumin: 3.9 g/dL (ref 3.5–5.0)
Alkaline Phosphatase: 57 U/L (ref 38–126)
Anion gap: 10 (ref 5–15)
BUN: 12 mg/dL (ref 8–23)
CO2: 24 mmol/L (ref 22–32)
Calcium: 9.2 mg/dL (ref 8.9–10.3)
Chloride: 104 mmol/L (ref 98–111)
Creatinine, Ser: 0.72 mg/dL (ref 0.44–1.00)
GFR, Estimated: >60 mL/min (ref >60)
Glucose, Bld: 143 mg/dL — ABNORMAL HIGH (ref 70–99)
Potassium: 3.8 mmol/L (ref 3.5–5.1)
Sodium: 138 mmol/L (ref 135–145)
Total Bilirubin: 0.6 mg/dL (ref 0.3–1.2)
Total Protein: 7.8 g/dL (ref 6.5–8.1)

## 2020-05-23 ENCOUNTER — Other Ambulatory Visit: Payer: Self-pay | Admitting: Primary Care

## 2020-05-23 DIAGNOSIS — E119 Type 2 diabetes mellitus without complications: Secondary | ICD-10-CM

## 2020-05-23 DIAGNOSIS — I10 Essential (primary) hypertension: Secondary | ICD-10-CM

## 2020-05-23 DIAGNOSIS — E785 Hyperlipidemia, unspecified: Secondary | ICD-10-CM

## 2020-06-06 ENCOUNTER — Ambulatory Visit: Payer: Medicare HMO

## 2020-06-06 ENCOUNTER — Other Ambulatory Visit: Payer: Self-pay

## 2020-06-06 ENCOUNTER — Other Ambulatory Visit (INDEPENDENT_AMBULATORY_CARE_PROVIDER_SITE_OTHER): Payer: Medicare HMO

## 2020-06-06 DIAGNOSIS — E119 Type 2 diabetes mellitus without complications: Secondary | ICD-10-CM

## 2020-06-06 DIAGNOSIS — I10 Essential (primary) hypertension: Secondary | ICD-10-CM | POA: Diagnosis not present

## 2020-06-06 DIAGNOSIS — E785 Hyperlipidemia, unspecified: Secondary | ICD-10-CM

## 2020-06-06 LAB — COMPREHENSIVE METABOLIC PANEL
ALT: 14 U/L (ref 0–35)
AST: 19 U/L (ref 0–37)
Albumin: 4.1 g/dL (ref 3.5–5.2)
Alkaline Phosphatase: 55 U/L (ref 39–117)
BUN: 13 mg/dL (ref 6–23)
CO2: 30 mEq/L (ref 19–32)
Calcium: 9.8 mg/dL (ref 8.4–10.5)
Chloride: 105 mEq/L (ref 96–112)
Creatinine, Ser: 0.8 mg/dL (ref 0.40–1.20)
GFR: 72.48 mL/min (ref >60.00)
Glucose, Bld: 102 mg/dL — ABNORMAL HIGH (ref 70–99)
Potassium: 4.1 mEq/L (ref 3.5–5.1)
Sodium: 141 mEq/L (ref 135–145)
Total Bilirubin: 0.6 mg/dL (ref 0.2–1.2)
Total Protein: 7.7 g/dL (ref 6.0–8.3)

## 2020-06-06 LAB — LIPID PANEL
Cholesterol: 225 mg/dL — ABNORMAL HIGH (ref 0–200)
HDL: 55.9 mg/dL (ref >39.00)
LDL Cholesterol: 157 mg/dL — ABNORMAL HIGH (ref 0–99)
NonHDL: 168.77
Total CHOL/HDL Ratio: 4
Triglycerides: 59 mg/dL (ref 0.0–149.0)
VLDL: 11.8 mg/dL (ref 0.0–40.0)

## 2020-06-06 LAB — CBC
HCT: 37.7 % (ref 36.0–46.0)
Hemoglobin: 12.4 g/dL (ref 12.0–15.0)
MCHC: 33 g/dL (ref 30.0–36.0)
MCV: 93.9 fl (ref 78.0–100.0)
Platelets: 188 10*3/uL (ref 150.0–400.0)
RBC: 4.02 Mil/uL (ref 3.87–5.11)
RDW: 12.3 % (ref 11.5–15.5)
WBC: 7.1 10*3/uL (ref 4.0–10.5)

## 2020-06-06 LAB — HEMOGLOBIN A1C: Hgb A1c MFr Bld: 6.1 % (ref 4.6–6.5)

## 2020-06-07 ENCOUNTER — Telehealth: Payer: Self-pay

## 2020-06-07 ENCOUNTER — Other Ambulatory Visit: Payer: Self-pay

## 2020-06-07 ENCOUNTER — Ambulatory Visit: Payer: Medicare HMO

## 2020-06-07 NOTE — Telephone Encounter (Signed)
Attempted to complete AWV. Sent link for patient to complete virtual visit for AWV. Patient never connected. Called patient and left message on voicemail notifying patient that I was calling to complete her AWV and I would try her back in a few minutes. Called patient again and someone picked up the phone and hung up on me. Appointment was cancelled.

## 2020-06-11 ENCOUNTER — Other Ambulatory Visit: Payer: Self-pay

## 2020-06-11 ENCOUNTER — Encounter: Payer: Self-pay | Admitting: Primary Care

## 2020-06-11 ENCOUNTER — Ambulatory Visit (INDEPENDENT_AMBULATORY_CARE_PROVIDER_SITE_OTHER): Payer: Medicare HMO | Admitting: Primary Care

## 2020-06-11 VITALS — BP 136/74 | HR 82 | Temp 97.0°F | Ht 62.0 in | Wt 212.0 lb

## 2020-06-11 DIAGNOSIS — Z0000 Encounter for general adult medical examination without abnormal findings: Secondary | ICD-10-CM

## 2020-06-11 DIAGNOSIS — E119 Type 2 diabetes mellitus without complications: Secondary | ICD-10-CM

## 2020-06-11 DIAGNOSIS — E785 Hyperlipidemia, unspecified: Secondary | ICD-10-CM | POA: Diagnosis not present

## 2020-06-11 DIAGNOSIS — Z23 Encounter for immunization: Secondary | ICD-10-CM | POA: Diagnosis not present

## 2020-06-11 DIAGNOSIS — C50919 Malignant neoplasm of unspecified site of unspecified female breast: Secondary | ICD-10-CM

## 2020-06-11 DIAGNOSIS — I10 Essential (primary) hypertension: Secondary | ICD-10-CM

## 2020-06-11 MED ORDER — ATORVASTATIN CALCIUM 20 MG PO TABS
20.0000 mg | ORAL_TABLET | Freq: Every day | ORAL | 3 refills | Status: DC
Start: 2020-06-11 — End: 2021-05-09

## 2020-06-11 NOTE — Assessment & Plan Note (Signed)
Patient without compliant to atorvastatin 20 mg for months. Recent LDL of 157. Will resume and refills sent to pharmacy.   Repeat lipids next visit.

## 2020-06-11 NOTE — Assessment & Plan Note (Signed)
Overall controlled given her age, has not taken losartan in months as she ran out. Home readings are much better.  Given improved home readings, will remain off of losartan 50 mg. She will continue to monitor at home.

## 2020-06-11 NOTE — Assessment & Plan Note (Signed)
Influenza vaccine provided today, other vaccines UTD. Mammogram UTD. Bone density scan due, pending. Colon cancer screening UTD, due in 2024.  Discussed the importance of a healthy diet and regular exercise in order for weight loss, and to reduce the risk of any potential medical problems.  Exam today stable. Labs reviewed.

## 2020-06-11 NOTE — Assessment & Plan Note (Signed)
Recent A1C of 6.1 off medications which is well controlled off medications.  Patient also no longer on losartan, BP well managed. Urine microalbumin due next visit.   Eye exam UTD.   Foot exam UTD.  Follow up in 6 months.

## 2020-06-11 NOTE — Patient Instructions (Signed)
Continue exercising. You should be getting 150 minutes of moderate intensity exercise weekly.  Continue to work on a healthy diet. Ensure you are consuming 64 ounces of water daily.  Complete your bone density scan as scheduled.  Please schedule a follow up appointment in 6 months for diabetes check.  It was a pleasure to see you today!

## 2020-06-11 NOTE — Progress Notes (Signed)
Subjective:    Patient ID: Monica Villanueva, female    DOB: 1945/08/29, 75 y.o.   MRN: [id number]  HPI  This visit occurred during the SARS-CoV-2 public health emergency.  Safety protocols were in place, including screening questions prior to the visit, additional usage of staff PPE, and extensive cleaning of exam room while observing appropriate contact time as indicated for disinfecting solutions.   Monica Villanueva is a 75 year old female who presents today for complete physical.  Immunizations: -Influenza: Due today -Shingles: Completed Shingrix  -Pneumonia: Prevnar 2019, Pneumovax 2020 -Covid-19: Completed   Diet: She endorses a fair diet, trying to get back on track from the [place]  Exercise: Walking at home  Eye exam: Completes annually  Dental exam: Completes regularly   Mammogram: August 2021 Dexa: 2020, scheduled for 07/04/20 Colonoscopy: Cologuard 2021, negative Hep C Screen: Negative  BP Readings from Last 3 Encounters:  06/11/20 136/74  05/09/20 136/78  12/06/19 130/84   She checks her BP at home which runs 110's-120's/70's-80's. She denies headaches, dizziness, chest pain.    Review of Systems  Constitutional: Negative for unexpected weight change.  HENT: Negative for rhinorrhea.   Eyes: Negative for visual disturbance.  Respiratory: Negative for cough and shortness of breath.   Cardiovascular: Negative for chest pain.  Gastrointestinal: Negative for constipation and diarrhea.  Genitourinary: Negative for difficulty urinating.  Musculoskeletal: Negative for arthralgias and myalgias.  Skin: Negative for rash.  Allergic/Immunologic: Negative for environmental allergies.  Neurological: Negative for dizziness, numbness and headaches.  Psychiatric/Behavioral: The patient is not nervous/anxious.        Past Medical History:  Diagnosis Date  . Breast cancer ([place]) 2018   right breast  . Cancer ([place])   . GERD (gastroesophageal reflux disease)    OCC  . Keloid of  skin    right axilla  . Shingles 01/23/2019     Social History   Socioeconomic History  . Marital status: Widowed    Spouse name: Not on file  . Number of children: Not on file  . Years of education: Not on file  . Highest education level: Not on file  Occupational History  . Not on file  Tobacco Use  . Smoking status: Never Smoker  . Smokeless tobacco: Never Used  Vaping Use  . Vaping Use: Never used  Substance and Sexual Activity  . Alcohol use: Yes    Comment: rare use  . Drug use: No  . Sexual activity: Not Currently  Other Topics Concern  . Not on file  Social History Narrative   Widowed.   2 children, 4 grandchildren.   Retired. Previously worked at [employer].   Enjoys writing, playing on the key boards.   Social Determinants of Health   Financial Resource Strain: Not on file  Food Insecurity: Not on file  Transportation Needs: Not on file  Physical Activity: Not on file  Stress: Not on file  Social Connections: Not on file  Intimate Partner Violence: Not on file    Past Surgical History:  Procedure Laterality Date  . ABDOMINAL HYSTERECTOMY  1996  . BREAST BIOPSY Right 07/15/2016   stereo bx of calcs LOQ-positive  . BREAST BIOPSY Right 07/15/2016   [place] bx of mass-positve  . BREAST CYST ASPIRATION Left 07/15/2016  . COLONOSCOPY    . MASTECTOMY Right 2018  . SENTINEL NODE BIOPSY Right 08/11/2016   Procedure: SENTINEL NODE BIOPSY;  Surgeon: Leonie Green, MD;  Location: [facility] ORS;  Service: General;  Laterality: Right;  . SIMPLE MASTECTOMY WITH AXILLARY SENTINEL NODE BIOPSY Right 08/11/2016   Procedure: SIMPLE MASTECTOMY;  Surgeon: Leonie Green, MD;  Location: [facility] ORS;  Service: General;  Laterality: Right;    Family History  Problem Relation Age of Onset  . Hypertension Mother   . Hypertension Sister   . Breast cancer Sister     No Known Allergies  Current Outpatient Medications on File Prior to Visit  Medication  Sig Dispense Refill  . ACCU-CHEK FASTCLIX LANCETS MISC USE TO CHECK BLOOD GLUCOSE UP TO 4 TIMES DAILY 300 each 1  . ACCU-CHEK GUIDE test strip USE TO CHECK BLOOD GLUCOSE UP TO 4 TIMES DAILY 100 each 5  . anastrozole (ARIMIDEX) 1 MG tablet TAKE 1 TABLET EVERY DAY 90 tablet 1  . blood glucose meter kit and supplies KIT Dispense based on patient and insurance preference. Use up to four times daily as directed. (FOR ICD-9 250.00, 250.01). 1 each 0  . calcium carbonate (OSCAL) 1500 (600 Ca) MG TABS tablet Take by mouth.    . calcium carbonate (TUMS - DOSED IN MG ELEMENTAL CALCIUM) 500 MG chewable tablet Chew 1 tablet by mouth as needed for indigestion or heartburn.    . Calcium-Magnesium-Vitamin D (CALCIUM 1200+D3 PO) Take 2 tablets by mouth daily. Calcium 1200 units / Vit D 800 mg    . COLLAGEN PO Take 1 capsule by mouth daily.     Javier Docker Oil (OMEGA-3) 500 MG CAPS Take 1 capsule by mouth daily.    . Multiple Vitamins-Minerals (HAIR SKIN AND NAILS FORMULA) TABS Take 1 tablet by mouth daily.      No current facility-administered medications on file prior to visit.    BP 136/74   Pulse 82   Temp (!) 97 F (36.1 C) (Temporal)   Ht $R'5\' 2"'KM$  (1.575 m)   Wt 212 lb (96.2 kg)   SpO2 96%   BMI 38.78 kg/m    Objective:   Physical Exam Constitutional:      Appearance: She is well-nourished.  HENT:     Right Ear: Tympanic membrane and ear canal normal.     Left Ear: Tympanic membrane and ear canal normal.     Mouth/Throat:     Mouth: Oropharynx is clear and moist.  Eyes:     Extraocular Movements: EOM normal.     Pupils: Pupils are equal, round, and reactive to light.  Cardiovascular:     Rate and Rhythm: Normal rate and regular rhythm.  Pulmonary:     Effort: Pulmonary effort is normal.     Breath sounds: Normal breath sounds.  Abdominal:     General: Bowel sounds are normal.     Palpations: Abdomen is soft.     Tenderness: There is no abdominal tenderness.  Musculoskeletal:         General: Normal range of motion.     Cervical back: Neck supple.  Skin:    General: Skin is warm and dry.  Neurological:     Mental Status: She is alert and oriented to person, place, and time.     Cranial Nerves: No cranial nerve deficit.     Deep Tendon Reflexes:     Reflex Scores:      Patellar reflexes are 2+ on the right side and 2+ on the left side. Psychiatric:        Mood and Affect: Mood and affect and mood normal.  Assessment & Plan:

## 2020-06-11 NOTE — Assessment & Plan Note (Signed)
Following with oncology, compliant to anastrazole 1 mg daily. Mammogram UTD, bone density scan due and pending for next month.

## 2020-07-04 ENCOUNTER — Other Ambulatory Visit: Payer: Self-pay

## 2020-07-04 ENCOUNTER — Ambulatory Visit
Admission: RE | Admit: 2020-07-04 | Discharge: 2020-07-04 | Disposition: A | Discharge status: Home / Self Care | Payer: Medicare HMO | Source: Ambulatory Visit | Attending: Oncology | Admitting: Oncology

## 2020-07-04 DIAGNOSIS — Z79811 Long term (current) use of aromatase inhibitors: Secondary | ICD-10-CM | POA: Insufficient documentation

## 2020-07-04 DIAGNOSIS — Z5181 Encounter for therapeutic drug level monitoring: Secondary | ICD-10-CM | POA: Diagnosis not present

## 2020-07-04 DIAGNOSIS — Z853 Personal history of malignant neoplasm of breast: Secondary | ICD-10-CM | POA: Diagnosis not present

## 2020-07-04 DIAGNOSIS — Z08 Encounter for follow-up examination after completed treatment for malignant neoplasm: Secondary | ICD-10-CM | POA: Diagnosis not present

## 2020-07-04 DIAGNOSIS — Z780 Asymptomatic menopausal state: Secondary | ICD-10-CM | POA: Diagnosis not present

## 2020-08-05 ENCOUNTER — Telehealth: Payer: Self-pay

## 2020-08-05 DIAGNOSIS — L0291 Cutaneous abscess, unspecified: Secondary | ICD-10-CM | POA: Diagnosis not present

## 2020-08-05 NOTE — Telephone Encounter (Signed)
Noted, it looks like she's arrived at [facility]. Can we call to check on her tomorrow (04/19)?

## 2020-08-05 NOTE — Telephone Encounter (Signed)
[facility] [facility] - Client TELEPHONE ADVICE RECORD [facility] Patient Name: KIMBERLEA [place] [facility] Gender: Female DOB: 1945-08-11 Age: 75 Y 9 M 11 D Return Phone Number: [phone] (Primary), [phone] (Secondary) Address: City/ State/ Zip: [place] [address] Client [facility] - Client Client Site [place] - Day Physician Alma Friendly - NP Contact Type Call Who Is Calling Patient / Member / Family / Caregiver Call Type Triage / Clinical Relationship To Patient Self Return Phone Number [phone] (Primary) Chief Complaint Vaginal Bleeding Reason for Call Symptomatic / Request for [place] states she has bleeding around her rectum and vaginal area. It is making her sick and nauseous. Translation No Nurse Assessment Nurse: Windle Guard, RN, Olin Hauser Date/Time (Eastern Time): 08/05/2020 11:19:30 AM Confirm and document reason for call. If symptomatic, describe symptoms. ---Caller states she has bleeding from 2 cysts near her rectum and vaginal area, has nausea. States started as hard knots and were very painful since Monday. States bleeding is very heavy, unsure if blood could be vaginal but thinks from cysts. States pain and bleeding have decreased. Does the patient have any new or worsening symptoms? ---Yes Will a triage be completed? ---Yes Related visit to physician within the last 2 weeks? ---No Does the PT have any chronic conditions? (i.e. diabetes, asthma, this includes High risk factors for pregnancy, etc.) ---Yes List chronic conditions. ---HTN, Possible Diabetes Is this a behavioral health or substance abuse call? ---No Guidelines Guideline Title Affirmed Question Affirmed Notes Nurse Date/Time (Eastern Time) Boil (Skin Abscess) 2 or more boils Conner, RN, Olin Hauser 08/05/2020 11:24:44 AM Disp. Time Eilene Ghazi Time) Disposition Final User 08/05/2020  11:27:22 AM See PCP within 24 Hours Yes Conner, RN, Olin Hauser PLEASE NOTE: All timestamps contained within this report are represented as [place] Standard Time. CONFIDENTIALTY NOTICE: This fax transmission is intended only for the addressee. It contains information that is legally privileged, confidential or otherwise protected from use or disclosure. If you are not the intended recipient, you are strictly prohibited from reviewing, disclosing, copying using or disseminating any of this information or taking any action in reliance on or regarding this information. If you have received this fax in error, please notify us immediately by telephone so that we can arrange for its return to [place]. Phone: [phone], Toll-Free: [phone], Fax: [phone] Page: 2 of 2 Call Id: [id number] [place] Disagree/Comply Comply Caller Understands Yes PreDisposition Did not know what to do Care Advice Given Per Guideline SEE PCP WITHIN 24 HOURS: TREATMENT - GENERAL: * Do not squeeze a boil or area of skin infection. Reason: This can push bacteria deeper into the skin. * Avoid touching or scratching the area. * Wash the area once a day with soap and water. * Keep your hands clean. Wash them with soap and water. TREATMENT - APPLY ANTIBIOTIC OINTMENT: * Apply an over-thecounter antibiotic ointment (e.g., Bacitracin) to the area of redness. * Do this three times daily. CARE ADVICE per Boil (Skin Abscess) (Adult) guideline. Referrals GO TO FACILITY UNDECIDED

## 2020-08-05 NOTE — Telephone Encounter (Signed)
Per chart review tab pt has gone to [place]

## 2020-08-06 NOTE — Telephone Encounter (Signed)
Noted and glad to hear.

## 2020-08-06 NOTE — Telephone Encounter (Signed)
Called patient she is feeling much better wanted to thank you for calling and checking on her.

## 2020-08-23 DIAGNOSIS — H1013 Acute atopic conjunctivitis, bilateral: Secondary | ICD-10-CM | POA: Diagnosis not present

## 2020-08-23 DIAGNOSIS — H01115 Allergic dermatitis of left lower eyelid: Secondary | ICD-10-CM | POA: Diagnosis not present

## 2020-08-26 DIAGNOSIS — H1013 Acute atopic conjunctivitis, bilateral: Secondary | ICD-10-CM | POA: Diagnosis not present

## 2020-08-26 DIAGNOSIS — H01115 Allergic dermatitis of left lower eyelid: Secondary | ICD-10-CM | POA: Diagnosis not present

## 2020-09-23 ENCOUNTER — Other Ambulatory Visit: Payer: Self-pay | Admitting: Oncology

## 2020-09-27 ENCOUNTER — Telehealth: Payer: Self-pay | Admitting: *Deleted

## 2020-09-27 NOTE — Telephone Encounter (Signed)
We don't have rapid tests yet do we?  When does she leave for a trip?  Any symptoms?  Are we allowed to swab her for this purpose? I have no problem with it.

## 2020-09-27 NOTE — Telephone Encounter (Signed)
PLEASE NOTE: All timestamps contained within this report are represented as [place] Standard Time. CONFIDENTIALTY NOTICE: This fax transmission is intended only for the addressee. It contains information that is legally privileged, confidential or otherwise protected from use or disclosure. If you are not the intended recipient, you are strictly prohibited from reviewing, disclosing, copying using or disseminating any of this information or taking any action in reliance on or regarding this information. If you have received this fax in error, please notify us immediately by telephone so that we can arrange for its return to [place]. Phone: [phone], Toll-Free: [phone], Fax: [phone] Page: 1 of 1 Call Id: [id number] [place] [facility] - Client Nonclinical Telephone Record  [facility] Client [place] [facility] - Client Client Site [place] Physician Alma Friendly - NP Contact Type Call Who Is Calling Patient / Member / Family / Caregiver Caller Name [place] Phone Number [phone] Patient Name Monica Villanueva Patient DOB March 30, 1946 Call Type Message Only Information Provided Reason for Call Request for General Office Information Initial Comment Caller states she wants to know if they give the rapid Covid test for taking a trip. Disp. Time Disposition Final User 09/27/2020 7:31:04 AM General Information Provided Yes Baruch Goldmann Call Closed By: Baruch Goldmann Transaction Date/Time: 09/27/2020 7:29:11 AM (ET)

## 2020-09-30 NOTE — Telephone Encounter (Signed)
Called no answer no vm. 

## 2020-10-03 NOTE — Telephone Encounter (Signed)
Left message to return call to our office.  

## 2020-10-07 NOTE — Telephone Encounter (Signed)
Left message to return call to our office.  

## 2020-10-07 NOTE — Telephone Encounter (Signed)
Called patient states that she needs rapid test done and we do not have here. She had no further questions at this time.

## 2020-10-17 DIAGNOSIS — Z20822 Contact with and (suspected) exposure to covid-19: Secondary | ICD-10-CM | POA: Diagnosis not present

## 2020-10-23 ENCOUNTER — Telehealth: Payer: Self-pay | Admitting: Primary Care

## 2020-10-23 NOTE — Telephone Encounter (Signed)
LVM for pt to rtn my call to schedule AWV with [facility].  

## 2020-10-29 ENCOUNTER — Telehealth: Payer: Self-pay | Admitting: Oncology

## 2020-10-29 NOTE — Telephone Encounter (Signed)
Left VM with patient with updated appointment information. Mailing AVS also.

## 2020-10-29 NOTE — Telephone Encounter (Signed)
Spoke with patient to reschedule MyChart visit. Patient agreeable to updated appt on 8/8. Sending appt reminder in the mail.

## 2020-11-05 ENCOUNTER — Inpatient Hospital Stay: Payer: Medicare HMO | Admitting: Oncology

## 2020-11-22 ENCOUNTER — Ambulatory Visit
Admission: RE | Admit: 2020-11-22 | Discharge: 2020-11-22 | Disposition: A | Discharge status: Home / Self Care | Payer: Medicare HMO | Source: Ambulatory Visit | Attending: Oncology | Admitting: Oncology

## 2020-11-22 ENCOUNTER — Other Ambulatory Visit: Payer: Self-pay

## 2020-11-22 DIAGNOSIS — Z1231 Encounter for screening mammogram for malignant neoplasm of breast: Secondary | ICD-10-CM | POA: Insufficient documentation

## 2020-11-22 DIAGNOSIS — Z08 Encounter for follow-up examination after completed treatment for malignant neoplasm: Secondary | ICD-10-CM | POA: Insufficient documentation

## 2020-11-22 DIAGNOSIS — Z853 Personal history of malignant neoplasm of breast: Secondary | ICD-10-CM | POA: Insufficient documentation

## 2020-11-22 DIAGNOSIS — Z79811 Long term (current) use of aromatase inhibitors: Secondary | ICD-10-CM | POA: Insufficient documentation

## 2020-11-22 DIAGNOSIS — Z5181 Encounter for therapeutic drug level monitoring: Secondary | ICD-10-CM | POA: Diagnosis not present

## 2020-11-25 ENCOUNTER — Encounter: Payer: Self-pay | Admitting: Oncology

## 2020-11-25 ENCOUNTER — Inpatient Hospital Stay: Payer: Medicare HMO | Attending: Oncology | Admitting: Oncology

## 2020-11-25 DIAGNOSIS — Z853 Personal history of malignant neoplasm of breast: Secondary | ICD-10-CM

## 2020-11-25 DIAGNOSIS — Z5181 Encounter for therapeutic drug level monitoring: Secondary | ICD-10-CM | POA: Diagnosis not present

## 2020-11-25 DIAGNOSIS — Z08 Encounter for follow-up examination after completed treatment for malignant neoplasm: Secondary | ICD-10-CM

## 2020-11-25 DIAGNOSIS — Z79811 Long term (current) use of aromatase inhibitors: Secondary | ICD-10-CM | POA: Diagnosis not present

## 2020-11-27 NOTE — Progress Notes (Signed)
I connected with Monica Villanueva on 11/27/20 at  2:45 PM EDT by video enabled telemedicine visit and verified that I am speaking with the correct person using two identifiers.   I discussed the limitations, risks, security and privacy concerns of performing an evaluation and management service by telemedicine and the availability of in-person appointments. I also discussed with the patient that there may be a patient responsible charge related to this service. The patient expressed understanding and agreed to proceed.  Other persons participating in the visit and their role in the encounter:  none  Patient's location:  home Provider's location:  work  Risk analyst Complaint: Routine follow-up of breast cancer  History of present illness: Patient is a 75 year old female with history of invasive lobular carcinoma of the right breast status post right simple mastectomy and sentinel lymph node biopsy in April 2018.  PT1CPN1 mi.  MammaPrint score came back at low risk and she did not require adjuvant chemotherapy.  She is currently on Arimidex which was started in May 2018.  She did not require adjuvant radiation treatment following mastectomy      Interval history patient is doing well on Arimidex along with calcium and vitamin D.  She reports no significant side effects.  She remains active and walks over a mile per day.  No changes in her appetite or weight.   Review of Systems  Constitutional:  Negative for chills, fever, malaise/fatigue and weight loss.  HENT:  Negative for congestion, ear discharge and nosebleeds.   Eyes:  Negative for blurred vision.  Respiratory:  Negative for cough, hemoptysis, sputum production, shortness of breath and wheezing.   Cardiovascular:  Negative for chest pain, palpitations, orthopnea and claudication.  Gastrointestinal:  Negative for abdominal pain, blood in stool, constipation, diarrhea, heartburn, melena, nausea and vomiting.  Genitourinary:  Negative for dysuria,  flank pain, frequency, hematuria and urgency.  Musculoskeletal:  Negative for back pain, joint pain and myalgias.  Skin:  Negative for rash.  Neurological:  Negative for dizziness, tingling, focal weakness, seizures, weakness and headaches.  Endo/Heme/Allergies:  Does not bruise/bleed easily.  Psychiatric/Behavioral:  Negative for depression and suicidal ideas. The patient does not have insomnia.    No Known Allergies  Past Medical History:  Diagnosis Date   Breast cancer ([place]) 2018   right breast   Cancer ([place])    GERD (gastroesophageal reflux disease)    OCC   Keloid of skin    right axilla   Malignant neoplasm of upper-outer quadrant of right breast in female, estrogen receptor positive ([place]) 08/27/2016   Shingles 01/23/2019    Past Surgical History:  Procedure Laterality Date   ABDOMINAL HYSTERECTOMY  1996   BREAST BIOPSY Right 07/15/2016   stereo bx of calcs LOQ-positive   BREAST BIOPSY Right 07/15/2016   [place] bx of mass-positve   BREAST CYST ASPIRATION Left 07/15/2016   COLONOSCOPY     MASTECTOMY Right 2018   SENTINEL NODE BIOPSY Right 08/11/2016   Procedure: SENTINEL NODE BIOPSY;  Surgeon: Leonie Green, MD;  Location: [facility] ORS;  Service: General;  Laterality: Right;   SIMPLE MASTECTOMY WITH AXILLARY SENTINEL NODE BIOPSY Right 08/11/2016   Procedure: SIMPLE MASTECTOMY;  Surgeon: Leonie Green, MD;  Location: [facility] ORS;  Service: General;  Laterality: Right;    Social History   Socioeconomic History   Marital status: Widowed    Spouse name: Not on file   Number of children: Not on file   Years of education: Not on file  Highest education level: Not on file  Occupational History   Not on file  Tobacco Use   Smoking status: Never   Smokeless tobacco: Never  Vaping Use   Vaping Use: Never used  Substance and Sexual Activity   Alcohol use: Yes    Comment: rare use   Drug use: No   Sexual activity: Not Currently  Other Topics Concern   Not on file   Social History Narrative   Widowed.   2 children, 4 grandchildren.   Retired. Previously worked at [employer].   Enjoys writing, playing on the key boards.   Social Determinants of Health   Financial Resource Strain: Not on file  Food Insecurity: Not on file  Transportation Needs: Not on file  Physical Activity: Not on file  Stress: Not on file  Social Connections: Not on file  Intimate Partner Violence: Not on file    Family History  Problem Relation Age of Onset   Hypertension Mother    Hypertension Sister    Breast cancer Sister      Current Outpatient Medications:    anastrozole (ARIMIDEX) 1 MG tablet, TAKE 1 TABLET EVERY DAY, Disp: 90 tablet, Rfl: 1   atorvastatin (LIPITOR) 20 MG tablet, Take 1 tablet (20 mg total) by mouth daily. For cholesterol., Disp: 90 tablet, Rfl: 3   calcium carbonate (OSCAL) 1500 (600 Ca) MG TABS tablet, Take by mouth., Disp: , Rfl:    calcium carbonate (TUMS - DOSED IN MG ELEMENTAL CALCIUM) 500 MG chewable tablet, Chew 1 tablet by mouth as needed for indigestion or heartburn., Disp: , Rfl:    Calcium-Magnesium-Vitamin D (CALCIUM 1200+D3 PO), Take 2 tablets by mouth daily. Calcium 1200 units / Vit D 800 mg, Disp: , Rfl:    COLLAGEN PO, Take 1 capsule by mouth daily. , Disp: , Rfl:    Krill Oil (OMEGA-3) 500 MG CAPS, Take 1 capsule by mouth daily., Disp: , Rfl:    Multiple Vitamins-Minerals (HAIR SKIN AND NAILS FORMULA) TABS, Take 1 tablet by mouth daily. , Disp: , Rfl:    ACCU-CHEK FASTCLIX LANCETS MISC, USE TO CHECK BLOOD GLUCOSE UP TO 4 TIMES DAILY (Patient not taking: Reported on 11/25/2020), Disp: 300 each, Rfl: 1   ACCU-CHEK GUIDE test strip, USE TO CHECK BLOOD GLUCOSE UP TO 4 TIMES DAILY (Patient not taking: Reported on 11/25/2020), Disp: 100 each, Rfl: 5   blood glucose meter kit and supplies KIT, Dispense based on patient and insurance preference. Use up to four times daily as directed. (FOR ICD-9 250.00, 250.01). (Patient not  taking: Reported on 11/25/2020), Disp: 1 each, Rfl: 0  MM 3D SCREEN BREAST UNI LEFT  Result Date: 11/25/2020 CLINICAL DATA:  Screening. EXAM: DIGITAL SCREENING UNILATERAL LEFT MAMMOGRAM WITH CAD AND TOMOSYNTHESIS TECHNIQUE: Left screening digital craniocaudal and mediolateral oblique mammograms were obtained. Left screening digital breast tomosynthesis was performed. The images were evaluated with computer-aided detection. COMPARISON:  Previous exam(s). ACR Breast Density Category b: There are scattered areas of fibroglandular density. FINDINGS: The patient has had a right mastectomy. There are no findings suspicious for malignancy. IMPRESSION: No mammographic evidence of malignancy. A result letter of this screening mammogram will be mailed directly to the patient. RECOMMENDATION: Screening mammogram in one year.  (Code:[id number]) BI-RADS CATEGORY  1: Negative. Electronically Signed   By: Lillia Mountain M.D.   On: 11/25/2020 10:34   No images are attached to the encounter.   CMP Latest Ref Rng & Units 06/06/2020  Glucose 70 -  99 mg/dL 102(H)  BUN 6 - 23 mg/dL 13  Creatinine 0.40 - 1.20 mg/dL 0.80  Sodium 135 - 145 mEq/L 141  Potassium 3.5 - 5.1 mEq/L 4.1  Chloride 96 - 112 mEq/L 105  CO2 19 - 32 mEq/L 30  Calcium 8.4 - 10.5 mg/dL 9.8  Total Protein 6.0 - 8.3 g/dL 7.7  Total Bilirubin 0.2 - 1.2 mg/dL 0.6  Alkaline Phos 39 - 117 U/L 55  AST 0 - 37 U/L 19  ALT 0 - 35 U/L 14   CBC Latest Ref Rng & Units 06/06/2020  WBC 4.0 - 10.5 K/uL 7.1  Hemoglobin 12.0 - 15.0 g/dL 12.4  Hematocrit 36.0 - 46.0 % 37.7  Platelets 150.0 - 400.0 K/uL 188.0     Assessment and plan: Patient is a 75 year old female with history of stage I invasive mammary carcinoma of the right breast ER/PR positive HER2 negative s/p right mastectomy currently on Arimidex.  This is a routine follow-up visit  Overall patient seems to be tolerating Arimidex well without any significant side effects.  Reports no breast concerns.   Recent left breast mammogram from 11/22/2020 did not reveal any evidence of malignancy.  She also had a bone density scan in March 2022 Which showed stable osteopenia with a T score of -1.3 at the right femur neck overall stable as compared to bone density scan in 2020.  Follow-up instructions: I will see her back in 6 months in person for an in person breast exam  I discussed the assessment and treatment plan with the patient. The patient was provided an opportunity to ask questions and all were answered. The patient agreed with the plan and demonstrated an understanding of the instructions.   The patient was advised to call back or seek an in-person evaluation if the symptoms worsen or if the condition fails to improve as anticipated.    Visit Diagnosis: 1. Encounter for follow-up surveillance of breast cancer   2. Visit for monitoring Arimidex therapy     Dr. Randa Evens, MD, MPH [facility] Tel- [phone] 11/27/2020 7:23 AM

## 2020-12-02 DIAGNOSIS — E1136 Type 2 diabetes mellitus with diabetic cataract: Secondary | ICD-10-CM | POA: Diagnosis not present

## 2020-12-02 DIAGNOSIS — E119 Type 2 diabetes mellitus without complications: Secondary | ICD-10-CM | POA: Diagnosis not present

## 2020-12-02 DIAGNOSIS — Z0100 Encounter for examination of eyes and vision without abnormal findings: Secondary | ICD-10-CM | POA: Diagnosis not present

## 2020-12-02 LAB — HM DIABETES EYE EXAM

## 2020-12-05 ENCOUNTER — Encounter: Payer: Self-pay | Admitting: Primary Care

## 2020-12-10 ENCOUNTER — Ambulatory Visit (INDEPENDENT_AMBULATORY_CARE_PROVIDER_SITE_OTHER): Payer: Medicare HMO | Admitting: Primary Care

## 2020-12-10 ENCOUNTER — Other Ambulatory Visit: Payer: Self-pay

## 2020-12-10 ENCOUNTER — Encounter: Payer: Self-pay | Admitting: Primary Care

## 2020-12-10 VITALS — BP 134/78 | HR 73 | Temp 97.6°F | Ht 62.0 in | Wt 216.0 lb

## 2020-12-10 DIAGNOSIS — E785 Hyperlipidemia, unspecified: Secondary | ICD-10-CM

## 2020-12-10 DIAGNOSIS — E119 Type 2 diabetes mellitus without complications: Secondary | ICD-10-CM | POA: Diagnosis not present

## 2020-12-10 LAB — POCT GLYCOSYLATED HEMOGLOBIN (HGB A1C): Hemoglobin A1C: 8.7 % — AB (ref 4.0–5.6)

## 2020-12-10 LAB — LIPID PANEL
Cholesterol: 153 mg/dL (ref 0–200)
HDL: 48.2 mg/dL (ref >39.00)
LDL Cholesterol: 93 mg/dL (ref 0–99)
NonHDL: 105.27
Total CHOL/HDL Ratio: 3
Triglycerides: 59 mg/dL (ref 0.0–149.0)
VLDL: 11.8 mg/dL (ref 0.0–40.0)

## 2020-12-10 LAB — MICROALBUMIN / CREATININE URINE RATIO
Creatinine,U: 156 mg/dL
Microalb Creat Ratio: 0.8 mg/g (ref 0.0–30.0)
Microalb, Ur: 1.3 mg/dL (ref 0.0–1.9)

## 2020-12-10 NOTE — Patient Instructions (Signed)
Stop by the lab prior to leaving today. I will notify you of your results once received.   Schedule a lab appointment for 3 months to repeat your diabetes test.  Please schedule a physical to meet with me in 6 months.   It was a pleasure to see you today!  Diabetes Mellitus and Nutrition, Adult When you have diabetes, or diabetes mellitus, it is very important to have healthy eating habits because your blood sugar (glucose) levels are greatly affected by what you eat and drink. Eating healthy foods in the right amounts, at about the same times every day, can help you: Control your blood glucose. Lower your risk of heart disease. Improve your blood pressure. Reach or maintain a healthy weight. What can affect my meal plan? Every person with diabetes is different, and each person has different needs for a meal plan. Your health care provider may recommend that you work with a dietitian to make a meal plan that is best for you. Your meal plan may vary depending on factors such as: The calories you need. The medicines you take. Your weight. Your blood glucose, blood pressure, and cholesterol levels. Your activity level. Other health conditions you have, such as heart or kidney disease. How do carbohydrates affect me? Carbohydrates, also called carbs, affect your blood glucose level more than any other type of food. Eating carbs naturally raises the amount of glucose in your blood. Carb counting is a method for keeping track of how many carbs you eat. Counting carbs is important to keep your blood glucose at a healthy level,especially if you use insulin or take certain oral diabetes medicines. It is important to know how many carbs you can safely have in each meal. This is different for every person. Your dietitian can help you calculate how manycarbs you should have at each meal and for each snack. How does alcohol affect me? Alcohol can cause a sudden decrease in blood glucose (hypoglycemia),  especially if you use insulin or take certain oral diabetes medicines. Hypoglycemia can be a life-threatening condition. Symptoms of hypoglycemia, such as sleepiness, dizziness, and confusion, are similar to symptoms of having too much alcohol. Do not drink alcohol if: Your health care provider tells you not to drink. You are pregnant, may be pregnant, or are planning to become pregnant. If you drink alcohol: Do not drink on an empty stomach. Limit how much you use to: 0-1 drink a day for women. 0-2 drinks a day for men. Be aware of how much alcohol is in your drink. In the [place] one drink equals one 12 oz bottle of beer (355 mL), one 5 oz glass of wine (148 mL), or one 1 oz glass of hard liquor (44 mL). Keep yourself hydrated with water, diet soda, or unsweetened iced tea. Keep in mind that regular soda, juice, and other mixers may contain a lot of sugar and must be counted as carbs. What are tips for following this plan?  Reading food labels Start by checking the serving size on the "Nutrition Facts" label of packaged foods and drinks. The amount of calories, carbs, fats, and other nutrients listed on the label is based on one serving of the item. Many items contain more than one serving per package. Check the total grams (g) of carbs in one serving. You can calculate the number of servings of carbs in one serving by dividing the total carbs by 15. For example, if a food has 30 g of total carbs per  serving, it would be equal to 2 servings of carbs. Check the number of grams (g) of saturated fats and trans fats in one serving. Choose foods that have a low amount or none of these fats. Check the number of milligrams (mg) of salt (sodium) in one serving. Most people should limit total sodium intake to less than 2,300 mg per day. Always check the nutrition information of foods labeled as "low-fat" or "nonfat." These foods may be higher in added sugar or refined carbs and should be avoided. Talk to  your dietitian to identify your daily goals for nutrients listed on the label. Shopping Avoid buying canned, pre-made, or processed foods. These foods tend to be high in fat, sodium, and added sugar. Shop around the outside edge of the grocery store. This is where you will most often find fresh fruits and vegetables, bulk grains, fresh meats, and fresh dairy. Cooking Use low-heat cooking methods, such as baking, instead of high-heat cooking methods like deep frying. Cook using healthy oils, such as olive, canola, or sunflower oil. Avoid cooking with butter, cream, or high-fat meats. Meal planning Eat meals and snacks regularly, preferably at the same times every day. Avoid going long periods of time without eating. Eat foods that are high in fiber, such as fresh fruits, vegetables, beans, and whole grains. Talk with your dietitian about how many servings of carbs you can eat at each meal. Eat 4-6 oz (112-168 g) of lean protein each day, such as lean meat, chicken, fish, eggs, or tofu. One ounce (oz) of lean protein is equal to: 1 oz (28 g) of meat, chicken, or fish. 1 egg.  cup (62 g) of tofu. Eat some foods each day that contain healthy fats, such as avocado, nuts, seeds, and fish. What foods should I eat? Fruits Berries. Apples. Oranges. Peaches. Apricots. Plums. Grapes. Mango. Papaya.Pomegranate. Kiwi. Cherries. Vegetables Lettuce. Spinach. Leafy greens, including kale, chard, collard greens, and mustard greens. Beets. Cauliflower. Cabbage. Broccoli. Carrots. Green beans.Tomatoes. Peppers. Onions. Cucumbers. Brussels sprouts. Grains Whole grains, such as whole-wheat or whole-grain bread, crackers, tortillas,cereal, and pasta. Unsweetened oatmeal. Quinoa. Bramel or wild rice. Meats and other proteins Seafood. Poultry without skin. Lean cuts of poultry and beef. Tofu. Nuts. Seeds. Dairy Low-fat or fat-free dairy products such as milk, yogurt, and cheese. The items listed above may not be  a complete list of foods and beverages you can eat. Contact a dietitian for more information. What foods should I avoid? Fruits Fruits canned with syrup. Vegetables Canned vegetables. Frozen vegetables with butter or cream sauce. Grains Refined white flour and flour products such as bread, pasta, snack foods, andcereals. Avoid all processed foods. Meats and other proteins Fatty cuts of meat. Poultry with skin. Breaded or fried meats. Processed meat.Avoid saturated fats. Dairy Full-fat yogurt, cheese, or milk. Beverages Sweetened drinks, such as soda or iced tea. The items listed above may not be a complete list of foods and beverages you should avoid. Contact a dietitian for more information. Questions to ask a health care provider Do I need to meet with a diabetes educator? Do I need to meet with a dietitian? What number can I call if I have questions? When are the best times to check my blood glucose? Where to find more information: American Diabetes Association: diabetes.org Academy of Nutrition and Dietetics: www.eatright.[employer] of Diabetes and Digestive and Kidney Diseases: [employer] Association of Diabetes Care and Education Specialists: www.diabeteseducator.org Summary It is important to have healthy eating habits because  your blood sugar (glucose) levels are greatly affected by what you eat and drink. A healthy meal plan will help you control your blood glucose and maintain a healthy lifestyle. Your health care provider may recommend that you work with a dietitian to make a meal plan that is best for you. Keep in mind that carbohydrates (carbs) and alcohol have immediate effects on your blood glucose levels. It is important to count carbs and to use alcohol carefully. This information is not intended to replace advice given to you by your health care provider. Make sure you discuss any questions you have with your healthcare provider. Document Revised:  03/14/2019 Document Reviewed: 03/14/2019 Elsevier Patient Education  2021 [employer].

## 2020-12-10 NOTE — Assessment & Plan Note (Signed)
Uncontrolled with significant increase to 8.7! She did go on a cruise and has been careless with her diet. She is motivated to work on lifestyle changes.  Discussed options, she declines medication at this time, will work on lifestyle changes. Will repeat A1C in 3 months, if above 8 then plan to initiate low dose treatment.  Urine microalbumin due and pending. Foot exam today. Pneumonia vaccine UTD.  Repeat A1C in 3 months. Follow up in 6 months.

## 2020-12-10 NOTE — Assessment & Plan Note (Signed)
Compliant to atorvastatin 20 mg, repeat lipid panel pending.  LDL goal of <100.

## 2020-12-10 NOTE — Progress Notes (Signed)
Subjective:    Patient ID: Monica Villanueva, female    DOB: May 11, 1945, 75 y.o.   MRN: [id number]  HPI  Monica Villanueva is a very pleasant 75 y.o. female with a history of hypertension, type 2 diabetes, breast cancer, hyperlipidemia who presents today for follow up of diabetes and repeat lipid panel.  Current medications include: None.   She is checking her blood glucose 0 times daily.   Last A1C: 6.1 in February 2022, 8.7 today Last Eye Exam: UTD Last Foot Exam: Due Pneumonia Vaccination: 2020 Urine Microalbumin: Due Statin: atorvastatin 20 mg   Dietary changes since last visit: Increased ice cream consumption about one month ago, has stopped. She did take a week long cruise in July for her birthday.    Exercise: She is walking daily.      Review of Systems  Eyes:  Negative for visual disturbance.  Respiratory:  Negative for shortness of breath.   Cardiovascular:  Negative for chest pain.  Neurological:  Negative for dizziness and numbness.        Past Medical History:  Diagnosis Date   Breast cancer ([place]) 2018   right breast   Cancer ([place])    GERD (gastroesophageal reflux disease)    OCC   Keloid of skin    right axilla   Malignant neoplasm of upper-outer quadrant of right breast in female, estrogen receptor positive ([place]) 08/27/2016   Shingles 01/23/2019    Social History   Socioeconomic History   Marital status: Widowed    Spouse name: Not on file   Number of children: Not on file   Years of education: Not on file   Highest education level: Not on file  Occupational History   Not on file  Tobacco Use   Smoking status: Never   Smokeless tobacco: Never  Vaping Use   Vaping Use: Never used  Substance and Sexual Activity   Alcohol use: Yes    Comment: rare use   Drug use: No   Sexual activity: Not Currently  Other Topics Concern   Not on file  Social History Narrative   Widowed.   2 children, 4 grandchildren.   Retired. Previously worked at  [employer].   Enjoys writing, playing on the key boards.   Social Determinants of Health   Financial Resource Strain: Not on file  Food Insecurity: Not on file  Transportation Needs: Not on file  Physical Activity: Not on file  Stress: Not on file  Social Connections: Not on file  Intimate Partner Violence: Not on file    Past Surgical History:  Procedure Laterality Date   ABDOMINAL HYSTERECTOMY  1996   BREAST BIOPSY Right 07/15/2016   stereo bx of calcs LOQ-positive   BREAST BIOPSY Right 07/15/2016   [place] bx of mass-positve   BREAST CYST ASPIRATION Left 07/15/2016   COLONOSCOPY     MASTECTOMY Right 2018   SENTINEL NODE BIOPSY Right 08/11/2016   Procedure: SENTINEL NODE BIOPSY;  Surgeon: Leonie Green, MD;  Location: [facility] ORS;  Service: General;  Laterality: Right;   SIMPLE MASTECTOMY WITH AXILLARY SENTINEL NODE BIOPSY Right 08/11/2016   Procedure: SIMPLE MASTECTOMY;  Surgeon: Leonie Green, MD;  Location: [facility] ORS;  Service: General;  Laterality: Right;    Family History  Problem Relation Age of Onset   Hypertension Mother    Hypertension Sister    Breast cancer Sister     No Known Allergies  Current Outpatient Medications on File Prior to  Visit  Medication Sig Dispense Refill   anastrozole (ARIMIDEX) 1 MG tablet TAKE 1 TABLET EVERY DAY 90 tablet 1   atorvastatin (LIPITOR) 20 MG tablet Take 1 tablet (20 mg total) by mouth daily. For cholesterol. 90 tablet 3   calcium carbonate (OSCAL) 1500 (600 Ca) MG TABS tablet Take by mouth.     calcium carbonate (TUMS - DOSED IN MG ELEMENTAL CALCIUM) 500 MG chewable tablet Chew 1 tablet by mouth as needed for indigestion or heartburn.     Calcium-Magnesium-Vitamin D (CALCIUM 1200+D3 PO) Take 2 tablets by mouth daily. Calcium 1200 units / Vit D 800 mg     COLLAGEN PO Take 1 capsule by mouth daily.      Krill Oil (OMEGA-3) 500 MG CAPS Take 1 capsule by mouth daily.     Multiple Vitamins-Minerals (HAIR SKIN  AND NAILS FORMULA) TABS Take 1 tablet by mouth daily.      ACCU-CHEK FASTCLIX LANCETS MISC USE TO CHECK BLOOD GLUCOSE UP TO 4 TIMES DAILY (Patient not taking: Reported on 12/10/2020) 300 each 1   ACCU-CHEK GUIDE test strip USE TO CHECK BLOOD GLUCOSE UP TO 4 TIMES DAILY (Patient not taking: Reported on 12/10/2020) 100 each 5   blood glucose meter kit and supplies KIT Dispense based on patient and insurance preference. Use up to four times daily as directed. (FOR ICD-9 250.00, 250.01). (Patient not taking: No sig reported) 1 each 0   No current facility-administered medications on file prior to visit.    BP 134/78   Pulse 73   Temp 97.6 F (36.4 C) (Temporal)   Ht 5' 2"  (1.575 m)   Wt 216 lb (98 kg)   SpO2 95%   BMI 39.51 kg/m  Objective:   Physical Exam Cardiovascular:     Rate and Rhythm: Normal rate and regular rhythm.  Pulmonary:     Effort: Pulmonary effort is normal.     Breath sounds: Normal breath sounds.  Musculoskeletal:     Cervical back: Neck supple.  Skin:    General: Skin is warm and dry.  Psychiatric:        Mood and Affect: Mood normal.          Assessment & Plan:      This visit occurred during the SARS-CoV-2 public health emergency.  Safety protocols were in place, including screening questions prior to the visit, additional usage of staff PPE, and extensive cleaning of exam room while observing appropriate contact time as indicated for disinfecting solutions.

## 2021-01-05 ENCOUNTER — Ambulatory Visit (INDEPENDENT_AMBULATORY_CARE_PROVIDER_SITE_OTHER): Payer: Medicare HMO

## 2021-01-05 DIAGNOSIS — Z0000 Encounter for general adult medical examination without abnormal findings: Secondary | ICD-10-CM | POA: Diagnosis not present

## 2021-01-05 NOTE — Progress Notes (Signed)
 Subjective:   Monica Villanueva is a 75 y.o. female who presents for Medicare Annual (Subsequent) preventive examination.  I connected with  Abbrielle Webb on 01/05/21 by a audio enabled telemedicine application and verified that I am speaking with the correct person using two identifiers.   I discussed the limitations of evaluation and management by telemedicine. The patient expressed understanding and agreed to proceed. Location of patient: Home Location of provider: Office Persona are participant in visit Monica Villanueva (Patient) and Shena Rahe, RMA.  Review of Systems    Defer to PCP       Objective:    There were no vitals filed for this visit. There is no height or weight on file to calculate BMI.  Advanced Directives 11/25/2020 06/06/2019 05/09/2019 11/03/2018 05/30/2018 05/05/2018 11/04/2017  Does Patient Have a Medical Advance Directive? No No No No No No No  Would patient like information on creating a medical advance directive? - No - Patient declined No - Patient declined No - Patient declined No - Patient declined No - Patient declined Yes (MAU/Ambulatory/Procedural Areas - Information given)    Current Medications (verified) Outpatient Encounter Medications as of 01/05/2021  Medication Sig   ACCU-CHEK FASTCLIX LANCETS MISC USE TO CHECK BLOOD GLUCOSE UP TO 4 TIMES DAILY (Patient not taking: Reported on 12/10/2020)   ACCU-CHEK GUIDE test strip USE TO CHECK BLOOD GLUCOSE UP TO 4 TIMES DAILY (Patient not taking: Reported on 12/10/2020)   anastrozole (ARIMIDEX) 1 MG tablet TAKE 1 TABLET EVERY DAY   atorvastatin (LIPITOR) 20 MG tablet Take 1 tablet (20 mg total) by mouth daily. For cholesterol.   blood glucose meter kit and supplies KIT Dispense based on patient and insurance preference. Use up to four times daily as directed. (FOR ICD-9 250.00, 250.01). (Patient not taking: No sig reported)   calcium carbonate (OSCAL) 1500 (600 Ca) MG TABS tablet Take by mouth.   calcium carbonate (TUMS - DOSED  IN MG ELEMENTAL CALCIUM) 500 MG chewable tablet Chew 1 tablet by mouth as needed for indigestion or heartburn.   Calcium-Magnesium-Vitamin D (CALCIUM 1200+D3 PO) Take 2 tablets by mouth daily. Calcium 1200 units / Vit D 800 mg   COLLAGEN PO Take 1 capsule by mouth daily.    Krill Oil (OMEGA-3) 500 MG CAPS Take 1 capsule by mouth daily.   Multiple Vitamins-Minerals (HAIR SKIN AND NAILS FORMULA) TABS Take 1 tablet by mouth daily.    No facility-administered encounter medications on file as of 01/05/2021.    Allergies (verified) Patient has no known allergies.   History: Past Medical History:  Diagnosis Date   Breast cancer (HCC) 2018   right breast   Cancer (HCC)    GERD (gastroesophageal reflux disease)    OCC   Keloid of skin    right axilla   Malignant neoplasm of upper-outer quadrant of right breast in female, estrogen receptor positive (HCC) 08/27/2016   Shingles 01/23/2019   Past Surgical History:  Procedure Laterality Date   ABDOMINAL HYSTERECTOMY  1996   BREAST BIOPSY Right 07/15/2016   stereo bx of calcs LOQ-positive   BREAST BIOPSY Right 07/15/2016   us bx of mass-positve   BREAST CYST ASPIRATION Left 07/15/2016   COLONOSCOPY     MASTECTOMY Right 2018   SENTINEL NODE BIOPSY Right 08/11/2016   Procedure: SENTINEL NODE BIOPSY;  Surgeon: Jarvis Wilton Smith, MD;  Location: [facility] ORS;  Service: General;  Laterality: Right;   SIMPLE MASTECTOMY WITH AXILLARY SENTINEL NODE BIOPSY Right 08/11/2016     Procedure: SIMPLE MASTECTOMY;  Surgeon: Leonie Green, MD;  Location: [facility] ORS;  Service: General;  Laterality: Right;   Family History  Problem Relation Age of Onset   Hypertension Mother    Hypertension Sister    Breast cancer Sister    Social History   Socioeconomic History   Marital status: Widowed    Spouse name: Not on file   Number of children: Not on file   Years of education: Not on file   Highest education level: Not on file  Occupational History   Not on  file  Tobacco Use   Smoking status: Never   Smokeless tobacco: Never  Vaping Use   Vaping Use: Never used  Substance and Sexual Activity   Alcohol use: Yes    Comment: rare use   Drug use: No   Sexual activity: Not Currently  Other Topics Concern   Not on file  Social History Narrative   Widowed.   2 children, 4 grandchildren.   Retired. Previously worked at [employer].   Enjoys writing, playing on the key boards.   Social Determinants of Health   Financial Resource Strain: Not on file  Food Insecurity: Not on file  Transportation Needs: Not on file  Physical Activity: Not on file  Stress: Not on file  Social Connections: Not on file    Tobacco Counseling Counseling given: Not Answered   Clinical Intake:                 Diabetic?NO         Activities of Daily Living In your present state of health, do you have any difficulty performing the following activities: 06/11/2020  Hearing? N  Vision? N  Difficulty concentrating or making decisions? N  Walking or climbing stairs? N  Dressing or bathing? N  Doing errands, shopping? N  Some recent data might be hidden    Patient Care Team: Pleas Koch, NP as PCP - General (Internal Medicine)  Indicate any recent Medical Services you may have received from other than [facility] providers in the past year (date may be approximate).     Assessment:   This is a routine wellness examination for Monica Villanueva.  Hearing/Vision screen No results found.  Dietary issues and exercise activities discussed:     Goals Addressed   None    Depression Screen PHQ 2/9 Scores 06/11/2020 06/06/2019 05/30/2018 05/14/2017 05/13/2016  PHQ - 2 Score 0 0 0 0 0  PHQ- 9 Score 2 0 0 0 -    Fall Risk Fall Risk  06/11/2020 06/06/2019 05/30/2018 05/14/2017  Falls in the past year? 0 0 0 No  Number falls in past yr: 0 0 - -  Injury with Fall? 0 0 - -  Risk for fall due to : - Medication side effect - -  Follow up -  Falls evaluation completed;Falls prevention discussed - -    FALL RISK PREVENTION PERTAINING TO THE HOME:  Any stairs in or around the home? Yes  If so, are there any without handrails? Yes  Home free of loose throw rugs in walkways, pet beds, electrical cords, etc? No  Adequate lighting in your home to reduce risk of falls? Yes   ASSISTIVE DEVICES UTILIZED TO PREVENT FALLS:  Life alert? No  Use of a cane, walker or w/c? No  Grab bars in the bathroom? No  Shower chair or bench in shower? Yes  Elevated toilet seat or a handicapped toilet? No  TIMED UP AND GO:  Was the test performed? No .      Cognitive Function: MMSE - Mini Mental State Exam 06/06/2019 05/30/2018 05/14/2017  Orientation to time 5 5 5  Orientation to Place 5 5 5  Registration 3 3 3  Attention/ Calculation 5 0 0  Recall 3 3 3  Language- name 2 objects - 0 0  Language- repeat 1 1 1  Language- follow 3 step command - 3 3  Language- read & follow direction - 0 0  Write a sentence - 0 0  Copy design - 0 0  Total score - 20 20        Immunizations Immunization History  Administered Date(s) Administered   Fluad Quad(high Dose 65+) 12/21/2018, 06/11/2020   Influenza,inj,Quad PF,6+ Mos 05/13/2016, 05/14/2017, 05/30/2018   PFIZER(Purple Top)SARS-COV-2 Vaccination 06/14/2019, 07/12/2019, 03/11/2020   Pneumococcal Conjugate-13 05/14/2017   Pneumococcal Polysaccharide-23 05/30/2018   Zoster Recombinat (Shingrix) 06/02/2018, 12/21/2018    TDAP status: Due, Education has been provided regarding the importance of this vaccine. Advised may receive this vaccine at local pharmacy or Health Dept. Aware to provide a copy of the vaccination record if obtained from local pharmacy or Health Dept. Verbalized acceptance and understanding.  Flu Vaccine status: Due, Education has been provided regarding the importance of this vaccine. Advised may receive this vaccine at local pharmacy or Health Dept. Aware to provide a copy  of the vaccination record if obtained from local pharmacy or Health Dept. Verbalized acceptance and understanding.  Pneumococcal vaccine status: Due, Education has been provided regarding the importance of this vaccine. Advised may receive this vaccine at local pharmacy or Health Dept. Aware to provide a copy of the vaccination record if obtained from local pharmacy or Health Dept. Verbalized acceptance and understanding.  Covid-19 vaccine status: Information provided on how to obtain vaccines.   Qualifies for Shingles Vaccine? Yes   Zostavax completed Yes   Shingrix Completed?: No.    Education has been provided regarding the importance of this vaccine. Patient has been advised to call insurance company to determine out of pocket expense if they have not yet received this vaccine. Advised may also receive vaccine at local pharmacy or Health Dept. Verbalized acceptance and understanding.  Screening Tests Health Maintenance  Topic Date Due   TETANUS/TDAP  Never done   COVID-19 Vaccine (4 - Booster for Pfizer series) 06/03/2020   INFLUENZA VACCINE  11/18/2020   HEMOGLOBIN A1C  06/12/2021   OPHTHALMOLOGY EXAM  12/02/2021   FOOT EXAM  12/10/2021   URINE MICROALBUMIN  12/10/2021   Fecal DNA (Cologuard)  06/20/2022   DEXA SCAN  Completed   Hepatitis C Screening  Completed   Zoster Vaccines- Shingrix  Completed   HPV VACCINES  Aged Out    Health Maintenance  Health Maintenance Due  Topic Date Due   TETANUS/TDAP  Never done   COVID-19 Vaccine (4 - Booster for Pfizer series) 06/03/2020   INFLUENZA VACCINE  11/18/2020    Colorectal cancer screening: Type of screening: Colonoscopy. Completed NO. Repeat every   years  Mammogram status: Completed 1 years. Repeat every year  Bone Density status: Completed  . Results reflect: Bone density results: NORMAL. Repeat every   years.  Lung Cancer Screening: (Low Dose CT Chest recommended if Age 55-80 years, 30 pack-year currently smoking OR have  quit w/in 15years.) does qualify.   Lung Cancer Screening Referral: NO  Additional Screening:  Hepatitis C Screening: does qualify; Completed Yes  Vision Screening:   Recommended annual ophthalmology exams for early detection of glaucoma and other disorders of the eye. Is the patient up to date with their annual eye exam?  Yes  Who is the provider or what is the name of the office in which the patient attends annual eye exams? 1 month ago If pt is not established with a provider, would they like to be referred to a provider to establish care? No .   Dental Screening: Recommended annual dental exams for proper oral hygiene  Community Resource Referral / Chronic Care Management: CRR required this visit?  No   CCM required this visit?  No      Plan:     I have personally reviewed and noted the following in the patient's chart:   Medical and social history Use of alcohol, tobacco or illicit drugs  Current medications and supplements including opioid prescriptions.  Functional ability and status Nutritional status Physical activity Advanced directives List of other physicians Hospitalizations, surgeries, and ER visits in previous 12 months Vitals Screenings to include cognitive, depression, and falls Referrals and appointments  In addition, I have reviewed and discussed with patient certain preventive protocols, quality metrics, and best practice recommendations. A written personalized care plan for preventive services as well as general preventive health recommendations were provided to patient.     Shena Tippens, RMA   01/05/2021   Nurse Notes: Non face to Face 60 minute visit.   Ms. Cantave , Thank you for taking time to come for your Medicare Wellness Visit. I appreciate your ongoing commitment to your health goals. Please review the following plan we discussed and let me know if I can assist you in the future.   These are the goals we discussed:  Goals      Increase  physical activity     Starting 05/30/2018, I will continue to exercise for at least 60 minutes daily.      Patient Stated     06/06/2019, I will continue to exercise everyday for about 30 minutes.         This is a list of the screening recommended for you and due dates:  Health Maintenance  Topic Date Due   Tetanus Vaccine  Never done   COVID-19 Vaccine (4 - Booster for Pfizer series) 06/03/2020   Flu Shot  11/18/2020   Hemoglobin A1C  06/12/2021   Eye exam for diabetics  12/02/2021   Complete foot exam   12/10/2021   Urine Protein Check  12/10/2021   Cologuard (Stool DNA test)  06/20/2022   DEXA scan (bone density measurement)  Completed   Hepatitis C Screening: USPSTF Recommendation to screen - Ages 18-79 yo.  Completed   Zoster (Shingles) Vaccine  Completed   HPV Vaccine  Aged Out         

## 2021-01-05 NOTE — Patient Instructions (Signed)
Health Maintenance, Female Adopting a healthy lifestyle and getting preventive care are important in promoting health and wellness. Ask your health care provider about: The right schedule for you to have regular tests and exams. Things you can do on your own to prevent diseases and keep yourself healthy. What should I know about diet, weight, and exercise? Eat a healthy diet  Eat a diet that includes plenty of vegetables, fruits, low-fat dairy products, and lean protein. Do not eat a lot of foods that are high in solid fats, added sugars, or sodium. Maintain a healthy weight Body mass index (BMI) is used to identify weight problems. It estimates body fat based on height and weight. Your health care provider can help determine your BMI and help you achieve or maintain a healthy weight. Get regular exercise Get regular exercise. This is one of the most important things you can do for your health. Most adults should: Exercise for at least 150 minutes each week. The exercise should increase your heart rate and make you sweat (moderate-intensity exercise). Do strengthening exercises at least twice a week. This is in addition to the moderate-intensity exercise. Spend less time sitting. Even light physical activity can be beneficial. Watch cholesterol and blood lipids Have your blood tested for lipids and cholesterol at 75 years of age, then have this test every 5 years. Have your cholesterol levels checked more often if: Your lipid or cholesterol levels are high. You are older than 75 years of age. You are at high risk for heart disease. What should I know about cancer screening? Depending on your health history and family history, you may need to have cancer screening at various ages. This may include screening for: Breast cancer. Cervical cancer. Colorectal cancer. Skin cancer. Lung cancer. What should I know about heart disease, diabetes, and high blood pressure? Blood pressure and heart  disease High blood pressure causes heart disease and increases the risk of stroke. This is more likely to develop in people who have high blood pressure readings, are of African descent, or are overweight. Have your blood pressure checked: Every 3-5 years if you are 18-39 years of age. Every year if you are 40 years old or older. Diabetes Have regular diabetes screenings. This checks your fasting blood sugar level. Have the screening done: Once every three years after age 40 if you are at a normal weight and have a low risk for diabetes. More often and at a younger age if you are overweight or have a high risk for diabetes. What should I know about preventing infection? Hepatitis B If you have a higher risk for hepatitis B, you should be screened for this virus. Talk with your health care provider to find out if you are at risk for hepatitis B infection. Hepatitis C Testing is recommended for: Everyone born from 1945 through 1965. Anyone with known risk factors for hepatitis C. Sexually transmitted infections (STIs) Get screened for STIs, including gonorrhea and chlamydia, if: You are sexually active and are younger than 75 years of age. You are older than 75 years of age and your health care provider tells you that you are at risk for this type of infection. Your sexual activity has changed since you were last screened, and you are at increased risk for chlamydia or gonorrhea. Ask your health care provider if you are at risk. Ask your health care provider about whether you are at high risk for HIV. Your health care provider may recommend a prescription medicine   to help prevent HIV infection. If you choose to take medicine to prevent HIV, you should first get tested for HIV. You should then be tested every 3 months for as long as you are taking the medicine. Pregnancy If you are about to stop having your period (premenopausal) and you may become pregnant, seek counseling before you get  pregnant. Take 400 to 800 micrograms (mcg) of folic acid every day if you become pregnant. Ask for birth control (contraception) if you want to prevent pregnancy. Osteoporosis and menopause Osteoporosis is a disease in which the bones lose minerals and strength with aging. This can result in bone fractures. If you are 65 years old or older, or if you are at risk for osteoporosis and fractures, ask your health care provider if you should: Be screened for bone loss. Take a calcium or vitamin D supplement to lower your risk of fractures. Be given hormone replacement therapy (HRT) to treat symptoms of menopause. Follow these instructions at home: Lifestyle Do not use any products that contain nicotine or tobacco, such as cigarettes, e-cigarettes, and chewing tobacco. If you need help quitting, ask your health care provider. Do not use street drugs. Do not share needles. Ask your health care provider for help if you need support or information about quitting drugs. Alcohol use Do not drink alcohol if: Your health care provider tells you not to drink. You are pregnant, may be pregnant, or are planning to become pregnant. If you drink alcohol: Limit how much you use to 0-1 drink a day. Limit intake if you are breastfeeding. Be aware of how much alcohol is in your drink. In the [place] one drink equals one 12 oz bottle of beer (355 mL), one 5 oz glass of wine (148 mL), or one 1 oz glass of hard liquor (44 mL). General instructions Schedule regular health, dental, and eye exams. Stay current with your vaccines. Tell your health care provider if: You often feel depressed. You have ever been abused or do not feel safe at home. Summary Adopting a healthy lifestyle and getting preventive care are important in promoting health and wellness. Follow your health care provider's instructions about healthy diet, exercising, and getting tested or screened for diseases. Follow your health care provider's  instructions on monitoring your cholesterol and blood pressure. This information is not intended to replace advice given to you by your health care provider. Make sure you discuss any questions you have with your health care provider. Document Revised: 06/14/2020 Document Reviewed: 03/30/2018 Elsevier Patient Education  2022 [employer]  

## 2021-01-07 NOTE — Progress Notes (Signed)
I reviewed health advisor's note, was available for consultation, and agree with documentation and plan.  

## 2021-02-26 ENCOUNTER — Other Ambulatory Visit: Payer: Self-pay | Admitting: Primary Care

## 2021-02-26 DIAGNOSIS — E119 Type 2 diabetes mellitus without complications: Secondary | ICD-10-CM

## 2021-03-04 ENCOUNTER — Other Ambulatory Visit: Payer: Self-pay

## 2021-03-04 DIAGNOSIS — E119 Type 2 diabetes mellitus without complications: Secondary | ICD-10-CM

## 2021-03-04 NOTE — Telephone Encounter (Signed)
Fax received from [facility] requesting refill on testing supplies.   Marland KitchenLAST APPOINTMENT DATE: 12/10/2020   NEXT APPOINTMENT DATE: 06/12/2021

## 2021-03-05 MED ORDER — ACCU-CHEK FASTCLIX LANCETS MISC: 2 refills | Status: AC

## 2021-03-05 MED ORDER — ACCU-CHEK GUIDE VI STRP
ORAL_STRIP | 2 refills | Status: DC
Start: 2021-03-05 — End: 2021-09-29

## 2021-03-12 ENCOUNTER — Other Ambulatory Visit: Payer: Medicare HMO

## 2021-04-08 ENCOUNTER — Other Ambulatory Visit: Payer: Self-pay | Admitting: Primary Care

## 2021-04-08 DIAGNOSIS — E119 Type 2 diabetes mellitus without complications: Secondary | ICD-10-CM

## 2021-04-16 NOTE — Telephone Encounter (Signed)
This encounter was created in error - please disregard.

## 2021-04-18 ENCOUNTER — Telehealth: Payer: Self-pay | Admitting: *Deleted

## 2021-04-18 DIAGNOSIS — E119 Type 2 diabetes mellitus without complications: Secondary | ICD-10-CM

## 2021-04-18 NOTE — Telephone Encounter (Signed)
Thank you for that information! Changed order to A1C draw. Will keep lab appt.

## 2021-04-18 NOTE — Telephone Encounter (Signed)
Pt has an upcoming lab appt for a POCT A1C only, which are not performed by the lab. Please reschedule for a nurse visit.  Thanks

## 2021-04-22 ENCOUNTER — Other Ambulatory Visit: Payer: Medicare HMO

## 2021-04-25 ENCOUNTER — Other Ambulatory Visit: Payer: Medicare HMO

## 2021-05-01 ENCOUNTER — Other Ambulatory Visit: Payer: Self-pay

## 2021-05-01 ENCOUNTER — Other Ambulatory Visit (INDEPENDENT_AMBULATORY_CARE_PROVIDER_SITE_OTHER): Payer: Medicare HMO

## 2021-05-01 DIAGNOSIS — E119 Type 2 diabetes mellitus without complications: Secondary | ICD-10-CM | POA: Diagnosis not present

## 2021-05-02 LAB — HEMOGLOBIN A1C
Hgb A1c MFr Bld: 10.9 % of total Hgb — ABNORMAL HIGH (ref <5.7)
Mean Plasma Glucose: 266 mg/dL
eAG (mmol/L): 14.7 mmol/L

## 2021-05-07 ENCOUNTER — Other Ambulatory Visit: Payer: Self-pay | Admitting: Primary Care

## 2021-05-07 DIAGNOSIS — E785 Hyperlipidemia, unspecified: Secondary | ICD-10-CM

## 2021-05-08 ENCOUNTER — Telehealth (INDEPENDENT_AMBULATORY_CARE_PROVIDER_SITE_OTHER): Payer: Medicare HMO | Admitting: Primary Care

## 2021-05-08 ENCOUNTER — Other Ambulatory Visit: Payer: Self-pay

## 2021-05-08 VITALS — BP 131/81 | HR 61 | Wt 210.0 lb

## 2021-05-08 DIAGNOSIS — E1165 Type 2 diabetes mellitus with hyperglycemia: Secondary | ICD-10-CM | POA: Diagnosis not present

## 2021-05-08 MED ORDER — METFORMIN HCL ER 500 MG PO TB24: 500.0000 mg | ORAL_TABLET | Freq: Every day | ORAL | 1 refills | Status: DC

## 2021-05-08 NOTE — Progress Notes (Signed)
Patient ID: Monica Villanueva, female    DOB: 03/18/1946, 76 y.o.   MRN: [id number]  Virtual visit completed through [employer], a video enabled telemedicine application. Due to national recommendations of social distancing due to COVID-19, a virtual visit is felt to be most appropriate for this patient at this time. Reviewed limitations, risks, security and privacy concerns of performing a virtual visit and the availability of in person appointments. I also reviewed that there may be a patient responsible charge related to this service. The patient agreed to proceed.   Patient location: home Provider location: [place] at [facility], office Persons participating in this virtual visit: patient, provider   If any vitals were documented, they were collected by patient at home unless specified below.    We attempted to connect via video but she was unable to enable her camera. We conducted her visit via telephone which lasted 11 min.  BP 131/81    Pulse 61    Wt 210 lb (95.3 kg)    BMI 38.41 kg/m    CC: Follow Up for Diabetes. Subjective:   HPI: Monica Villanueva is a 76 y.o. female with a history of type 2 diabetes, hypertension, presenting on 05/08/2021 for Follow-up (DM )  Recent A1C of 10.9, increased from 8.7 in August 2022. During her visit in August she declined diabetes medication as she wanted to work on her diet.   She endorses a poor diet over the holidays including sweets. She does not check her blood sugars, is supposed to be getting her lancets soon from her mail order pharmacy.   A few days ago she's changed her diet by drinking more water, eating vegetables and fish, is limiting starchy food and sweets. She is walking everyday.   She denies polyuria, polydipsia, blurred vision, numbness to her feet. She has never been treated for diabetes.       Relevant past medical, surgical, family and social history reviewed and updated as indicated. Interim medical history since our last  visit reviewed. Allergies and medications reviewed and updated. Outpatient Medications Prior to Visit  Medication Sig Dispense Refill   Accu-Chek FastClix Lancets MISC USE TO CHECK BLOOD GLUCOSE UP TO 4 TIMES DAILY 300 each 2   Alcohol Swabs (DROPSAFE ALCOHOL PREP) 70 % PADS Apply topically.     anastrozole (ARIMIDEX) 1 MG tablet TAKE 1 TABLET EVERY DAY 90 tablet 1   atorvastatin (LIPITOR) 20 MG tablet Take 1 tablet (20 mg total) by mouth daily. For cholesterol. 90 tablet 3   calcium carbonate (OSCAL) 1500 (600 Ca) MG TABS tablet Take by mouth.     calcium carbonate (TUMS - DOSED IN MG ELEMENTAL CALCIUM) 500 MG chewable tablet Chew 1 tablet by mouth as needed for indigestion or heartburn.     Calcium-Magnesium-Vitamin D (CALCIUM 1200+D3 PO) Take 2 tablets by mouth daily. Calcium 1200 units / Vit D 800 mg     COLLAGEN PO Take 1 capsule by mouth daily.      glucose blood (ACCU-CHEK GUIDE) test strip USE TO CHECK BLOOD GLUCOSE UP TO 4 TIMES DAILY 300 each 2   Krill Oil (OMEGA-3) 500 MG CAPS Take 1 capsule by mouth daily.     Multiple Vitamins-Minerals (HAIR SKIN AND NAILS FORMULA) TABS Take 1 tablet by mouth daily.      OVER THE COUNTER MEDICATION Take 2 capsules by mouth daily at 12 noon. Cholesterol care     blood glucose meter kit and supplies KIT Dispense based on  patient and insurance preference. Use up to four times daily as directed. (FOR ICD-9 250.00, 250.01). (Patient not taking: Reported on 05/08/2021) 1 each 0   No facility-administered medications prior to visit.     Per HPI unless specifically indicated in ROS section below Review of Systems  Eyes:  Negative for visual disturbance.  Endocrine: Negative for polydipsia, polyphagia and polyuria.  Neurological:  Negative for dizziness and numbness.  Objective:  BP 131/81    Pulse 61    Wt 210 lb (95.3 kg)    BMI 38.41 kg/m   Wt Readings from Last 3 Encounters:  05/08/21 210 lb (95.3 kg)  12/10/20 216 lb (98 kg)  06/11/20 212 lb  (96.2 kg)       Physical exam: General: Alert and oriented x 3, no distress, does not appear sickly  Pulmonary: Speaks in complete sentences without increased work of breathing, no cough during visit.  Psychiatric: Normal mood, thought content, and behavior.     Results for orders placed or performed in visit on 05/01/21  Hemoglobin A1c  Result Value Ref Range   Hgb A1c MFr Bld 10.9 (H) <5.7 % of total Hgb   Mean Plasma Glucose 266 mg/dL   eAG (mmol/L) 14.7 mmol/L   Assessment & Plan:   Problem List Items Addressed This Visit       Endocrine   Type 2 diabetes mellitus with hyperglycemia ([place]) - Primary    Uncontrolled with increase in A1C to 10.9!  She is now back on track with her diet. She is exercising.   Strongly advised we intervene with medication treatment, she agrees. Rx for Metformin XR 500 mg sent to pharmacy to take once daily.   We will see her back next month with her glucose logs.  She will start checking glucose 2 times daily. Discussed correct times to check.      Relevant Medications   metFORMIN (GLUCOPHAGE XR) 500 MG 24 hr tablet     Meds ordered this encounter  Medications   metFORMIN (GLUCOPHAGE XR) 500 MG 24 hr tablet    Sig: Take 1 tablet (500 mg total) by mouth daily with breakfast. For diabetes.    Dispense:  90 tablet    Refill:  1    Order Specific Question:   Supervising Provider    Answer:   BEDSOLE, AMY E [[id number]]   No orders of the defined types were placed in this encounter.   I discussed the assessment and treatment plan with the patient. The patient was provided an opportunity to ask questions and all were answered. The patient agreed with the plan and demonstrated an understanding of the instructions. The patient was advised to call back or seek an in-person evaluation if the symptoms worsen or if the condition fails to improve as anticipated.  Follow up plan:  Start metformin XR 500 mg for diabetes. Take 1 tablet by mouth once  daily with breakfast.  Continue to work on your diet.  Continue exercising.  Check your blood sugars 2 times daily.  See you next month!  Pleas Koch, NP

## 2021-05-08 NOTE — Assessment & Plan Note (Signed)
Uncontrolled with increase in A1C to 10.9!  She is now back on track with her diet. She is exercising.   Strongly advised we intervene with medication treatment, she agrees. Rx for Metformin XR 500 mg sent to pharmacy to take once daily.   We will see her back next month with her glucose logs.  She will start checking glucose 2 times daily. Discussed correct times to check.

## 2021-05-08 NOTE — Patient Instructions (Signed)
Start metformin XR 500 mg for diabetes. Take 1 tablet by mouth once daily with breakfast.  Continue to work on your diet.  Continue exercising.  Check your blood sugars 2 times daily.  See you next month!

## 2021-05-08 NOTE — Telephone Encounter (Signed)
Had virtual visit today. Ok refill?

## 2021-05-30 ENCOUNTER — Inpatient Hospital Stay: Payer: Medicare HMO | Attending: Oncology | Admitting: Oncology

## 2021-05-30 ENCOUNTER — Other Ambulatory Visit: Payer: Self-pay

## 2021-05-30 VITALS — BP 145/73 | HR 63 | Temp 98.5°F | Resp 16 | Wt 214.8 lb

## 2021-05-30 DIAGNOSIS — C50411 Malignant neoplasm of upper-outer quadrant of right female breast: Secondary | ICD-10-CM | POA: Diagnosis not present

## 2021-05-30 DIAGNOSIS — Z8249 Family history of ischemic heart disease and other diseases of the circulatory system: Secondary | ICD-10-CM | POA: Insufficient documentation

## 2021-05-30 DIAGNOSIS — Z79811 Long term (current) use of aromatase inhibitors: Secondary | ICD-10-CM | POA: Diagnosis not present

## 2021-05-30 DIAGNOSIS — Z853 Personal history of malignant neoplasm of breast: Secondary | ICD-10-CM | POA: Diagnosis not present

## 2021-05-30 DIAGNOSIS — Z08 Encounter for follow-up examination after completed treatment for malignant neoplasm: Secondary | ICD-10-CM

## 2021-05-30 DIAGNOSIS — Z79899 Other long term (current) drug therapy: Secondary | ICD-10-CM | POA: Diagnosis not present

## 2021-05-30 DIAGNOSIS — Z803 Family history of malignant neoplasm of breast: Secondary | ICD-10-CM | POA: Insufficient documentation

## 2021-05-30 DIAGNOSIS — Z170 Estrogen receptor positive status [ER+]: Secondary | ICD-10-CM | POA: Diagnosis not present

## 2021-05-30 DIAGNOSIS — Z9011 Acquired absence of right breast and nipple: Secondary | ICD-10-CM | POA: Insufficient documentation

## 2021-05-30 DIAGNOSIS — Z5181 Encounter for therapeutic drug level monitoring: Secondary | ICD-10-CM

## 2021-05-30 NOTE — Progress Notes (Signed)
Pt in for 6 month follow up, reports she has not taken her arimidex for a month. Gets 3 month supply and hasn't called to request refill.

## 2021-06-01 ENCOUNTER — Encounter: Payer: Self-pay | Admitting: Oncology

## 2021-06-01 NOTE — Progress Notes (Signed)
Hematology/Oncology Consult note [facility]  Telephone:([phone] [phone]  Patient Care Team: Pleas Koch, NP as PCP - General (Internal Medicine)   Name of the patient: Monica Villanueva  [id number]  18-Jul-1945   Date of visit: 06/01/21  Diagnosis- 2 foci of invasive breast cancer in the right breast Stage I pT1c pn1(mi)sn cM0 ER/PR positive and Her2 negative s/p right mastectomy   Chief complaint/ Reason for visit-routine follow-up of breast cancer  Heme/Onc history: Patient is a 76 year old female with history of invasive lobular carcinoma of the right breast status post right simple mastectomy and sentinel lymph node biopsy in April 2018.  PT1CPN1 mi.  MammaPrint score came back at low risk and she did not require adjuvant chemotherapy.  She is currently on Arimidex which was started in May 2018.  She did not require adjuvant radiation treatment following mastectomy  Interval history-patient did not ask for refill of her Arimidex and ran out of it about a month ago.  Otherwise doing well and denies any breast concerns.  Appetite and weight have remained stable.  Denies any new aches and pains anywhere.  ECOG PS- 1 Pain scale- 0   Review of systems- Review of Systems  Constitutional:  Negative for chills, fever and weight loss.  HENT:  Negative for congestion, ear discharge and nosebleeds.   Eyes:  Negative for blurred vision.  Respiratory:  Negative for cough, hemoptysis, sputum production, shortness of breath and wheezing.   Cardiovascular:  Negative for chest pain, palpitations, orthopnea and claudication.  Gastrointestinal:  Negative for abdominal pain, blood in stool, constipation, diarrhea, heartburn, melena, nausea and vomiting.  Genitourinary:  Negative for dysuria, flank pain, frequency, hematuria and urgency.  Musculoskeletal:  Negative for back pain, joint pain and myalgias.  Skin:  Negative for rash.  Neurological:  Negative  for dizziness, tingling, focal weakness, seizures, weakness and headaches.  Endo/Heme/Allergies:  Does not bruise/bleed easily.  Psychiatric/Behavioral:  Negative for depression and suicidal ideas. The patient does not have insomnia.       No Known Allergies   Past Medical History:  Diagnosis Date   Breast cancer ([place]) 2018   right breast   Cancer ([place])    GERD (gastroesophageal reflux disease)    OCC   Keloid of skin    right axilla   Malignant neoplasm of upper-outer quadrant of right breast in female, estrogen receptor positive ([place]) 08/27/2016   Shingles 01/23/2019     Past Surgical History:  Procedure Laterality Date   ABDOMINAL HYSTERECTOMY  1996   BREAST BIOPSY Right 07/15/2016   stereo bx of calcs LOQ-positive   BREAST BIOPSY Right 07/15/2016   [place] bx of mass-positve   BREAST CYST ASPIRATION Left 07/15/2016   COLONOSCOPY     MASTECTOMY Right 2018   SENTINEL NODE BIOPSY Right 08/11/2016   Procedure: SENTINEL NODE BIOPSY;  Surgeon: Leonie Green, MD;  Location: [facility] ORS;  Service: General;  Laterality: Right;   SIMPLE MASTECTOMY WITH AXILLARY SENTINEL NODE BIOPSY Right 08/11/2016   Procedure: SIMPLE MASTECTOMY;  Surgeon: Leonie Green, MD;  Location: [facility] ORS;  Service: General;  Laterality: Right;    Social History   Socioeconomic History   Marital status: Widowed    Spouse name: Not on file   Number of children: Not on file   Years of education: Not on file   Highest education level: Not on file  Occupational History   Not on file  Tobacco Use  Smoking status: Never   Smokeless tobacco: Never  Vaping Use   Vaping Use: Never used  Substance and Sexual Activity   Alcohol use: Yes    Comment: rare use   Drug use: No   Sexual activity: Not Currently  Other Topics Concern   Not on file  Social History Narrative   Widowed.   2 children, 4 grandchildren.   Retired. Previously worked at [employer].   Enjoys writing, playing on  the key boards.   Social Determinants of Health   Financial Resource Strain: Low Risk    Difficulty of Paying Living Expenses: Not hard at all  Food Insecurity: No Food Insecurity   Worried About Charity fundraiser in the Last Year: Never true   [place] in the Last Year: Never true  Transportation Needs: No Transportation Needs   Lack of Transportation (Medical): No   Lack of Transportation (Non-Medical): No  Physical Activity: Sufficiently Active   Days of Exercise per Week: 3 days   Minutes of Exercise per Session: 50 min  Stress: No Stress Concern Present   Feeling of Stress : Only a little  Social Connections: Moderately Isolated   Frequency of Communication with Friends and Family: More than three times a week   Frequency of Social Gatherings with Friends and Family: Twice a week   Attends Religious Services: Never   Marine scientist or Organizations: Yes   Attends Archivist Meetings: Never   Marital Status: Widowed  Human resources officer Violence: Not At Risk   Fear of Current or Ex-Partner: No   Emotionally Abused: No   Physically Abused: No   Sexually Abused: No    Family History  Problem Relation Age of Onset   Hypertension Mother    Hypertension Sister    Breast cancer Sister      Current Outpatient Medications:    Accu-Chek FastClix Lancets MISC, USE TO CHECK BLOOD GLUCOSE UP TO 4 TIMES DAILY, Disp: 300 each, Rfl: 2   Alcohol Swabs (DROPSAFE ALCOHOL PREP) 70 % PADS, Apply topically., Disp: , Rfl:    atorvastatin (LIPITOR) 20 MG tablet, Take 1 tablet (20 mg total) by mouth daily. For cholesterol. Office visit required for further refills., Disp: 90 tablet, Rfl: 0   blood glucose meter kit and supplies KIT, Dispense based on patient and insurance preference. Use up to four times daily as directed. (FOR ICD-9 250.00, 250.01)., Disp: 1 each, Rfl: 0   calcium carbonate (TUMS - DOSED IN MG ELEMENTAL CALCIUM) 500 MG chewable tablet, Chew 1 tablet  by mouth as needed for indigestion or heartburn., Disp: , Rfl:    Calcium-Magnesium-Vitamin D (CALCIUM 1200+D3 PO), Take 2 tablets by mouth daily. Calcium 1200 units / Vit D 800 mg, Disp: , Rfl:    COLLAGEN PO, Take 1 capsule by mouth daily. , Disp: , Rfl:    glucose blood (ACCU-CHEK GUIDE) test strip, USE TO CHECK BLOOD GLUCOSE UP TO 4 TIMES DAILY, Disp: 300 each, Rfl: 2   Krill Oil (OMEGA-3) 500 MG CAPS, Take 1 capsule by mouth daily., Disp: , Rfl:    metFORMIN (GLUCOPHAGE XR) 500 MG 24 hr tablet, Take 1 tablet (500 mg total) by mouth daily with breakfast. For diabetes., Disp: 90 tablet, Rfl: 1   Multiple Vitamins-Minerals (HAIR SKIN AND NAILS FORMULA) TABS, Take 1 tablet by mouth daily. , Disp: , Rfl:    OVER THE COUNTER MEDICATION, Take 2 capsules by mouth daily at  12 noon. Cholesterol care, Disp: , Rfl:    anastrozole (ARIMIDEX) 1 MG tablet, TAKE 1 TABLET EVERY DAY (Patient not taking: Reported on 05/30/2021), Disp: 90 tablet, Rfl: 1  Physical exam:  Vitals:   05/30/21 1312  BP: (!) 145/73  Pulse: 63  Resp: 16  Temp: 98.5 F (36.9 C)  TempSrc: Tympanic  SpO2: 98%  Weight: 214 lb 12.8 oz (97.4 kg)   Physical Exam Constitutional:      General: She is not in acute distress. Cardiovascular:     Rate and Rhythm: Normal rate and regular rhythm.     Heart sounds: Normal heart sounds.  Pulmonary:     Effort: Pulmonary effort is normal.     Breath sounds: Normal breath sounds.  Skin:    General: Skin is warm and dry.  Neurological:     Mental Status: She is alert and oriented to person, place, and time.  Patient is s/p right mastectomy with a well-healed surgical scar.  No evidence of chest wall recurrence.  No palpable right axillary adenopathy.No palpable masses in the left breast and no palpable left axillary adenopathy.  CMP Latest Ref Rng & Units 06/06/2020  Glucose 70 - 99 mg/dL 102(H)  BUN 6 - 23 mg/dL 13  Creatinine 0.40 - 1.20 mg/dL 0.80  Sodium 135 - 145 mEq/L 141   Potassium 3.5 - 5.1 mEq/L 4.1  Chloride 96 - 112 mEq/L 105  CO2 19 - 32 mEq/L 30  Calcium 8.4 - 10.5 mg/dL 9.8  Total Protein 6.0 - 8.3 g/dL 7.7  Total Bilirubin 0.2 - 1.2 mg/dL 0.6  Alkaline Phos 39 - 117 U/L 55  AST 0 - 37 U/L 19  ALT 0 - 35 U/L 14   CBC Latest Ref Rng & Units 06/06/2020  WBC 4.0 - 10.5 K/uL 7.1  Hemoglobin 12.0 - 15.0 g/dL 12.4  Hematocrit 36.0 - 46.0 % 37.7  Platelets 150.0 - 400.0 K/uL 188.0      Assessment and plan- Patient is a 76 y.o. female  with history of stage I invasive mammary carcinoma of the right breast ER/PR positive HER-2/neu negative s/p right mastectomy currently on Arimidex.  She is here for routine follow-up visit for breast cancer  Clinically patient is doing well with no concerning signs and symptoms of recurrence based on today's exam.  Patient will complete 5 years of Arimidex in May 2023 and given that she has stage I breast cancer she does not require to take it longer than that.  Patient is due for left breast mammogram in August 2023 which we will schedule.  I will see her back in 6 months no labs   Visit Diagnosis 1. Encounter for follow-up surveillance of breast cancer      Dr. Randa Evens, MD, MPH [facility] at [facility] [phone] 06/01/2021 10:34 AM

## 2021-06-12 ENCOUNTER — Ambulatory Visit (INDEPENDENT_AMBULATORY_CARE_PROVIDER_SITE_OTHER): Payer: Medicare HMO | Admitting: Primary Care

## 2021-06-12 ENCOUNTER — Encounter: Payer: Self-pay | Admitting: Primary Care

## 2021-06-12 ENCOUNTER — Other Ambulatory Visit: Payer: Self-pay

## 2021-06-12 VITALS — BP 142/82 | HR 72 | Temp 98.5°F | Ht 61.0 in | Wt 208.0 lb

## 2021-06-12 DIAGNOSIS — E785 Hyperlipidemia, unspecified: Secondary | ICD-10-CM

## 2021-06-12 DIAGNOSIS — I10 Essential (primary) hypertension: Secondary | ICD-10-CM | POA: Diagnosis not present

## 2021-06-12 DIAGNOSIS — Z853 Personal history of malignant neoplasm of breast: Secondary | ICD-10-CM | POA: Diagnosis not present

## 2021-06-12 DIAGNOSIS — Z0000 Encounter for general adult medical examination without abnormal findings: Secondary | ICD-10-CM

## 2021-06-12 DIAGNOSIS — E1165 Type 2 diabetes mellitus with hyperglycemia: Secondary | ICD-10-CM

## 2021-06-12 LAB — COMPREHENSIVE METABOLIC PANEL
ALT: 16 U/L (ref 0–35)
AST: 21 U/L (ref 0–37)
Albumin: 4.1 g/dL (ref 3.5–5.2)
Alkaline Phosphatase: 75 U/L (ref 39–117)
BUN: 11 mg/dL (ref 6–23)
CO2: 26 mEq/L (ref 19–32)
Calcium: 9.5 mg/dL (ref 8.4–10.5)
Chloride: 106 mEq/L (ref 96–112)
Creatinine, Ser: 0.77 mg/dL (ref 0.40–1.20)
GFR: 75.35 mL/min (ref >60.00)
Glucose, Bld: 165 mg/dL — ABNORMAL HIGH (ref 70–99)
Potassium: 3.7 mEq/L (ref 3.5–5.1)
Sodium: 141 mEq/L (ref 135–145)
Total Bilirubin: 0.6 mg/dL (ref 0.2–1.2)
Total Protein: 7.9 g/dL (ref 6.0–8.3)

## 2021-06-12 MED ORDER — GLIPIZIDE ER 5 MG PO TB24: 5.0000 mg | ORAL_TABLET | Freq: Every day | ORAL | 1 refills | Status: DC

## 2021-06-12 NOTE — Assessment & Plan Note (Signed)
Above goal today.   We will discontinue metformin as patient believes elevated blood pressure readings are secondary to metformin.  We will plan to see her back in mid April for follow-up of diabetes and blood pressure check  Continue off medications for now.

## 2021-06-12 NOTE — Progress Notes (Signed)
Subjective:    Patient ID: Monica Villanueva, female    DOB: [birth year], 76 y.o.   MRN: [id number]  HPI  Tanae Petrosky is a very pleasant 76 y.o. female who presents today for complete physical and follow up of chronic conditions including type 2 diabetes.  A1c of 10.9 from January 2023, increased from 8.7.  We discussed this via phone visit and she agreed to occasion initiation with metformin XR 500 mg daily.  Today she endorses compliant to metformin as prescribed.  She denies GI upset, diarrhea. She has noticed fluctuations in her BP and headaches since starting metformin. She is working on her diet, cutting back on carbs and sweets, increasing fruit and vegetables. She is walking several times weekly.   She is checking glucose readings every AM fasting which run 120's-130's.   Immunizations: -Influenza: Due today  -Covid-19: 3 vaccine -Shingles: Completed Shingrix -Pneumonia: Prevnar 49 in 2019, Pneumovax in 2020   Diet: [place], improved since last visit  Exercise: Regular exercise, walking, chair exercises   Eye exam: Completes annually  Dental exam: Completes semi-annually   Mammogram: Completed in August 2022, scheduled for August 2023 Dexa: Completed in March 2022 Colonoscopy: Completed Cologuard in 2021, negative   BP Readings from Last 3 Encounters:  06/12/21 (!) 142/82  05/30/21 (!) 145/73  05/08/21 131/81   Wt Readings from Last 3 Encounters:  06/12/21 208 lb (94.3 kg)  05/30/21 214 lb 12.8 oz (97.4 kg)  05/08/21 210 lb (95.3 kg)      Review of Systems  Constitutional:  Negative for unexpected weight change.  HENT:  Negative for rhinorrhea.   Eyes:  Negative for visual disturbance.  Respiratory:  Negative for cough and shortness of breath.   Cardiovascular:  Negative for chest pain.  Gastrointestinal:  Negative for constipation and diarrhea.  Genitourinary:  Negative for difficulty urinating.  Musculoskeletal:  Negative for arthralgias and myalgias.  Skin:   Negative for rash.  Allergic/Immunologic: Negative for environmental allergies.  Neurological:  Positive for dizziness and headaches.  Psychiatric/Behavioral:  The patient is not nervous/anxious.         Past Medical History:  Diagnosis Date   Breast cancer ([place]) 2018   right breast   Cancer ([place])    GERD (gastroesophageal reflux disease)    OCC   Keloid of skin    right axilla   Malignant neoplasm of upper-outer quadrant of right breast in female, estrogen receptor positive ([place]) 08/27/2016   Shingles 01/23/2019    Social History   Socioeconomic History   Marital status: Widowed    Spouse name: Not on file   Number of children: Not on file   Years of education: Not on file   Highest education level: Not on file  Occupational History   Not on file  Tobacco Use   Smoking status: Never   Smokeless tobacco: Never  Vaping Use   Vaping Use: Never used  Substance and Sexual Activity   Alcohol use: Yes    Comment: rare use   Drug use: No   Sexual activity: Not Currently  Other Topics Concern   Not on file  Social History Narrative   Widowed.   2 children, 4 grandchildren.   Retired. Previously worked at [employer].   Enjoys writing, playing on the key boards.   Social Determinants of Health   Financial Resource Strain: Low Risk    Difficulty of Paying Living Expenses: Not hard at all  Food Insecurity: No  Food Insecurity   Worried About Charity fundraiser in the Last Year: Never true   Ran Out of Food in the Last Year: Never true  Transportation Needs: No Transportation Needs   Lack of Transportation (Medical): No   Lack of Transportation (Non-Medical): No  Physical Activity: Sufficiently Active   Days of Exercise per Week: 3 days   Minutes of Exercise per Session: 50 min  Stress: No Stress Concern Present   Feeling of Stress : Only a little  Social Connections: Moderately Isolated   Frequency of Communication with Friends and Family: More  than three times a week   Frequency of Social Gatherings with Friends and Family: Twice a week   Attends Religious Services: Never   Marine scientist or Organizations: Yes   Attends Archivist Meetings: Never   Marital Status: Widowed  Human resources officer Violence: Not At Risk   Fear of Current or Ex-Partner: No   Emotionally Abused: No   Physically Abused: No   Sexually Abused: No    Past Surgical History:  Procedure Laterality Date   ABDOMINAL HYSTERECTOMY  1996   BREAST BIOPSY Right 07/15/2016   stereo bx of calcs LOQ-positive   BREAST BIOPSY Right 07/15/2016   [place] bx of mass-positve   BREAST CYST ASPIRATION Left 07/15/2016   COLONOSCOPY     MASTECTOMY Right 2018   SENTINEL NODE BIOPSY Right 08/11/2016   Procedure: SENTINEL NODE BIOPSY;  Surgeon: Leonie Green, MD;  Location: [facility] ORS;  Service: General;  Laterality: Right;   SIMPLE MASTECTOMY WITH AXILLARY SENTINEL NODE BIOPSY Right 08/11/2016   Procedure: SIMPLE MASTECTOMY;  Surgeon: Leonie Green, MD;  Location: [facility] ORS;  Service: General;  Laterality: Right;    Family History  Problem Relation Age of Onset   Hypertension Mother    Hypertension Sister    Breast cancer Sister    Breast cancer Sister     No Known Allergies  Current Outpatient Medications on File Prior to Visit  Medication Sig Dispense Refill   Accu-Chek FastClix Lancets MISC USE TO CHECK BLOOD GLUCOSE UP TO 4 TIMES DAILY 300 each 2   Alcohol Swabs (DROPSAFE ALCOHOL PREP) 70 % PADS Apply topically.     anastrozole (ARIMIDEX) 1 MG tablet TAKE 1 TABLET EVERY DAY 90 tablet 1   atorvastatin (LIPITOR) 20 MG tablet Take 1 tablet (20 mg total) by mouth daily. For cholesterol. Office visit required for further refills. 90 tablet 0   calcium carbonate (TUMS - DOSED IN MG ELEMENTAL CALCIUM) 500 MG chewable tablet Chew 1 tablet by mouth as needed for indigestion or heartburn.     Calcium-Magnesium-Vitamin D (CALCIUM 1200+D3 PO) Take 2  tablets by mouth daily. Calcium 1200 units / Vit D 800 mg     COLLAGEN PO Take 1 capsule by mouth daily.      glucose blood (ACCU-CHEK GUIDE) test strip USE TO CHECK BLOOD GLUCOSE UP TO 4 TIMES DAILY 300 each 2   Krill Oil (OMEGA-3) 500 MG CAPS Take 1 capsule by mouth daily.     Multiple Vitamins-Minerals (HAIR SKIN AND NAILS FORMULA) TABS Take 1 tablet by mouth daily.      OVER THE COUNTER MEDICATION Take 2 capsules by mouth daily at 12 noon. Cholesterol care     No current facility-administered medications on file prior to visit.    BP (!) 142/82    Pulse 72    Temp 98.5 F (36.9 C) (Temporal)    Ht  5\' 1"  (1.549 m)    Wt 208 lb (94.3 kg)    SpO2 94%    BMI 39.30 kg/m  Objective:   Physical Exam HENT:     Right Ear: Tympanic membrane and ear canal normal.     Left Ear: Tympanic membrane and ear canal normal.     Nose: Nose normal.  Eyes:     Conjunctiva/sclera: Conjunctivae normal.     Pupils: Pupils are equal, round, and reactive to light.  Neck:     Thyroid: No thyromegaly.  Cardiovascular:     Rate and Rhythm: Normal rate and regular rhythm.     Heart sounds: No murmur heard. Pulmonary:     Effort: Pulmonary effort is normal.     Breath sounds: Normal breath sounds. No rales.  Abdominal:     General: Bowel sounds are normal.     Palpations: Abdomen is soft.     Tenderness: There is no abdominal tenderness.  Musculoskeletal:        General: Normal range of motion.     Cervical back: Neck supple.  Lymphadenopathy:     Cervical: No cervical adenopathy.  Skin:    General: Skin is warm and dry.     Findings: No rash.  Neurological:     Mental Status: She is alert and oriented to person, place, and time.     Cranial Nerves: No cranial nerve deficit.     Deep Tendon Reflexes: Reflexes are normal and symmetric.  Psychiatric:        Mood and Affect: Mood normal.          Assessment & Plan:      This visit occurred during the SARS-CoV-2 public health emergency.   Safety protocols were in place, including screening questions prior to the visit, additional usage of staff PPE, and extensive cleaning of exam room while observing appropriate contact time as indicated for disinfecting solutions.

## 2021-06-12 NOTE — Assessment & Plan Note (Signed)
Following with oncology.  Continue Arimidex 1 mg daily. Mammogram UTD.

## 2021-06-12 NOTE — Assessment & Plan Note (Signed)
Influenza vaccine updated today.  Other vaccines up-to-date.  Mammogram up-to-date. Density scan up-to-date. Colon cancer screening up-to-date with Cologuard.  Encouraged healthy diet and regular exercise, commended her on lifestyle changes thus far.  Exam today stable. Labs pending and also reviewed.

## 2021-06-12 NOTE — Assessment & Plan Note (Signed)
Controlled.  Continue atorvastatin 20 mg daily. 

## 2021-06-12 NOTE — Patient Instructions (Signed)
Start glipizide XL 5 mg once daily with breakfast for diabetes.  Stop metformin for diabetes.  Stop by the lab prior to leaving today. I will notify you of your results once received.   It was a pleasure to see you today!  Preventive Care 8 Years and Older, Female Preventive care refers to lifestyle choices and visits with your health care provider that can promote health and wellness. Preventive care visits are also called wellness exams. What can I expect for my preventive care visit? Counseling Your health care provider may ask you questions about your: Medical history, including: Past medical problems. Family medical history. Pregnancy and menstrual history. History of falls. Current health, including: Memory and ability to understand (cognition). Emotional well-being. Home life and relationship well-being. Sexual activity and sexual health. Lifestyle, including: Alcohol, nicotine or tobacco, and drug use. Access to firearms. Diet, exercise, and sleep habits. Work and work Statistician. Sunscreen use. Safety issues such as seatbelt and bike helmet use. Physical exam Your health care provider will check your: Height and weight. These may be used to calculate your BMI (body mass index). BMI is a measurement that tells if you are at a healthy weight. Waist circumference. This measures the distance around your waistline. This measurement also tells if you are at a healthy weight and may help predict your risk of certain diseases, such as type 2 diabetes and high blood pressure. Heart rate and blood pressure. Body temperature. Skin for abnormal spots. What immunizations do I need? Vaccines are usually given at various ages, according to a schedule. Your health care provider will recommend vaccines for you based on your age, medical history, and lifestyle or other factors, such as travel or where you work. What tests do I need? Screening Your health care provider may recommend  screening tests for certain conditions. This may include: Lipid and cholesterol levels. Hepatitis C test. Hepatitis B test. HIV (human immunodeficiency virus) test. STI (sexually transmitted infection) testing, if you are at risk. Lung cancer screening. Colorectal cancer screening. Diabetes screening. This is done by checking your blood sugar (glucose) after you have not eaten for a while (fasting). Mammogram. Talk with your health care provider about how often you should have regular mammograms. BRCA-related cancer screening. This may be done if you have a family history of breast, ovarian, tubal, or peritoneal cancers. Bone density scan. This is done to screen for osteoporosis. Talk with your health care provider about your test results, treatment options, and if necessary, the need for more tests. Follow these instructions at home: Eating and drinking  Eat a diet that includes fresh fruits and vegetables, whole grains, lean protein, and low-fat dairy products. Limit your intake of foods with high amounts of sugar, saturated fats, and salt. Take vitamin and mineral supplements as recommended by your health care provider. Do not drink alcohol if your health care provider tells you not to drink. If you drink alcohol: Limit how much you have to 0-1 drink a day. Know how much alcohol is in your drink. In the [place] one drink equals one 12 oz bottle of beer (355 mL), one 5 oz glass of wine (148 mL), or one 1 oz glass of hard liquor (44 mL). Lifestyle Brush your teeth every morning and night with fluoride toothpaste. Floss one time each day. Exercise for at least 30 minutes 5 or more days each week. Do not use any products that contain nicotine or tobacco. These products include cigarettes, chewing tobacco, and vaping devices,  such as e-cigarettes. If you need help quitting, ask your health care provider. Do not use drugs. If you are sexually active, practice safe sex. Use a condom or other  form of protection in order to prevent STIs. Take aspirin only as told by your health care provider. Make sure that you understand how much to take and what form to take. Work with your health care provider to find out whether it is safe and beneficial for you to take aspirin daily. Ask your health care provider if you need to take a cholesterol-lowering medicine (statin). Find healthy ways to manage stress, such as: Meditation, yoga, or listening to music. Journaling. Talking to a trusted person. Spending time with friends and family. Minimize exposure to UV radiation to reduce your risk of skin cancer. Safety Always wear your seat belt while driving or riding in a vehicle. Do not drive: If you have been drinking alcohol. Do not ride with someone who has been drinking. When you are tired or distracted. While texting. If you have been using any mind-altering substances or drugs. Wear a helmet and other protective equipment during sports activities. If you have firearms in your house, make sure you follow all gun safety procedures. What's next? Visit your health care provider once a year for an annual wellness visit. Ask your health care provider how often you should have your eyes and teeth checked. Stay up to date on all vaccines. This information is not intended to replace advice given to you by your health care provider. Make sure you discuss any questions you have with your health care provider. Document Revised: 10/02/2020 Document Reviewed: 10/02/2020 Elsevier Patient Education  [place].

## 2021-06-12 NOTE — Assessment & Plan Note (Signed)
Morning fasting readings appear improved, but also discussed for her to start checking glucose readings at different times of the day.  We discussed appropriate times for checking glucose readings  Discontinue metformin XR 500 mg. Start glipizide XL 5 mg daily.  New prescription sent to pharmacy.  Eye exam up-to-date Managed on statin. Urine microalbumin up-to-date. Foot exam up-to-date.  Follow-up in mid April 2023.

## 2021-08-05 ENCOUNTER — Encounter: Payer: Self-pay | Admitting: Primary Care

## 2021-08-05 ENCOUNTER — Ambulatory Visit (INDEPENDENT_AMBULATORY_CARE_PROVIDER_SITE_OTHER): Payer: Medicare HMO | Admitting: Primary Care

## 2021-08-05 VITALS — BP 142/88 | HR 65 | Temp 98.6°F | Ht 61.0 in | Wt 208.0 lb

## 2021-08-05 DIAGNOSIS — I10 Essential (primary) hypertension: Secondary | ICD-10-CM | POA: Diagnosis not present

## 2021-08-05 DIAGNOSIS — E1165 Type 2 diabetes mellitus with hyperglycemia: Secondary | ICD-10-CM

## 2021-08-05 LAB — POCT GLYCOSYLATED HEMOGLOBIN (HGB A1C): Hemoglobin A1C: 5.8 % — AB (ref 4.0–5.6)

## 2021-08-05 NOTE — Assessment & Plan Note (Signed)
Elevated readings over the last few months.  Unclear if this correlates with glipizide; however, will discontinue glipizide given excellent A1c result today and have her monitor blood pressure at home. ?

## 2021-08-05 NOTE — Assessment & Plan Note (Signed)
Significant improvement with A1c today of 5.8!! ?Commended her on her success! ? ?She would like to discontinue Glipizide XL 5 mg which is reasonable given her A1c level today and her age.  We did discuss the absolute need to continue to work on healthy lifestyle changes.  She agrees. ? ?Stop glipizide XL 5 mg daily. ?Repeat A1c in 3 months. ?

## 2021-08-05 NOTE — Patient Instructions (Signed)
Stop Glipizide XL 5 mg today for diabetes.  ? ?Keep up the great work with your diet! ? ?Schedule a lab appointment in 3 months for diabetes check. ? ?It was a pleasure to see you today! ? ? ?

## 2021-08-05 NOTE — Progress Notes (Signed)
? ?Subjective:  ? ? Patient ID: Monica Villanueva, female    DOB: 04/10/1946, 76 y.o.   MRN: [id number] ? ?HPI ? ?Monica Villanueva is a very pleasant 76 y.o. female with a history of type 2 diabetes, hypertension, breast cancer, hyperlipidemia who presents today for follow-up of diabetes. ? ?Current medications include: Glipizide XL 5 mg daily. She has noticed  ? ?She is checking her blood glucose 1 times daily and is getting readings of 70's-90's, some low 100's.  ? ?She denies feeling jittery, weakness, dizziness.  ? ?Last A1C: 10.9 in January 2023, 5.8 today  ?Last Eye Exam: UTD ?Last Foot Exam: UTD ?Pneumonia Vaccination: 2020 ?Urine Microalbumin: UTD ?Statin: atorvastatin  ? ?Dietary changes since last visit: She's increased protein, water intake. She has cut back on friend foods.  ? ? ?Exercise: She's been walking.  ? ?BP Readings from Last 3 Encounters:  ?08/05/21 (!) 142/88  ?06/12/21 (!) 142/82  ?05/30/21 (!) 145/73  ? ?She has noticed increases in her BP since starting Glipizide.  ? ?Review of Systems  ?Eyes:  Negative for visual disturbance.  ?Respiratory:  Negative for shortness of breath.   ?Cardiovascular:  Negative for chest pain.  ?Neurological:  Negative for dizziness and numbness.  ? ?   ? ? ?Past Medical History:  ?Diagnosis Date  ? Breast cancer ([place]) 2018  ? right breast  ? Cancer ([place])   ? GERD (gastroesophageal reflux disease)   ? OCC  ? Keloid of skin   ? right axilla  ? Malignant neoplasm of upper-outer quadrant of right breast in female, estrogen receptor positive ([place]) 08/27/2016  ? Shingles 01/23/2019  ? ? ?Social History  ? ?Socioeconomic History  ? Marital status: Widowed  ?  Spouse name: Not on file  ? Number of children: Not on file  ? Years of education: Not on file  ? Highest education level: Not on file  ?Occupational History  ? Not on file  ?Tobacco Use  ? Smoking status: Never  ? Smokeless tobacco: Never  ?Vaping Use  ? Vaping Use: Never used  ?Substance and Sexual Activity  ? Alcohol use:  Yes  ?  Comment: rare use  ? Drug use: No  ? Sexual activity: Not Currently  ?Other Topics Concern  ? Not on file  ?Social History Narrative  ? Widowed.  ? 2 children, 4 grandchildren.  ? Retired. Previously worked at [employer].  ? Enjoys writing, playing on the key boards.  ? ?Social Determinants of Health  ? ?Financial Resource Strain: Low Risk   ? Difficulty of Paying Living Expenses: Not hard at all  ?Food Insecurity: No Food Insecurity  ? Worried About Charity fundraiser in the Last Year: Never true  ? Ran Out of Food in the Last Year: Never true  ?Transportation Needs: No Transportation Needs  ? Lack of Transportation (Medical): No  ? Lack of Transportation (Non-Medical): No  ?Physical Activity: Sufficiently Active  ? Days of Exercise per Week: 3 days  ? Minutes of Exercise per Session: 50 min  ?Stress: No Stress Concern Present  ? Feeling of Stress : Only a little  ?Social Connections: Moderately Isolated  ? Frequency of Communication with Friends and Family: More than three times a week  ? Frequency of Social Gatherings with Friends and Family: Twice a week  ? Attends Religious Services: Never  ? Active Member of Clubs or Organizations: Yes  ? Attends Archivist Meetings: Never  ? Marital  Status: Widowed  ?Intimate Partner Violence: Not At Risk  ? Fear of Current or Ex-Partner: No  ? Emotionally Abused: No  ? Physically Abused: No  ? Sexually Abused: No  ? ? ?Past Surgical History:  ?Procedure Laterality Date  ? ABDOMINAL HYSTERECTOMY  1996  ? BREAST BIOPSY Right 07/15/2016  ? stereo bx of calcs LOQ-positive  ? BREAST BIOPSY Right 07/15/2016  ? [place] bx of mass-positve  ? BREAST CYST ASPIRATION Left 07/15/2016  ? COLONOSCOPY    ? MASTECTOMY Right 2018  ? SENTINEL NODE BIOPSY Right 08/11/2016  ? Procedure: SENTINEL NODE BIOPSY;  Surgeon: Leonie Green, MD;  Location: [facility] ORS;  Service: General;  Laterality: Right;  ? SIMPLE MASTECTOMY WITH AXILLARY SENTINEL NODE BIOPSY Right  08/11/2016  ? Procedure: SIMPLE MASTECTOMY;  Surgeon: Leonie Green, MD;  Location: [facility] ORS;  Service: General;  Laterality: Right;  ? ? ?Family History  ?Problem Relation Age of Onset  ? Hypertension Mother   ? Hypertension Sister   ? Breast cancer Sister   ? Breast cancer Sister   ? ? ?No Known Allergies ? ?Current Outpatient Medications on File Prior to Visit  ?Medication Sig Dispense Refill  ? Accu-Chek FastClix Lancets MISC USE TO CHECK BLOOD GLUCOSE UP TO 4 TIMES DAILY 300 each 2  ? Alcohol Swabs (DROPSAFE ALCOHOL PREP) 70 % PADS Apply topically.    ? anastrozole (ARIMIDEX) 1 MG tablet TAKE 1 TABLET EVERY DAY 90 tablet 1  ? atorvastatin (LIPITOR) 20 MG tablet Take 1 tablet (20 mg total) by mouth daily. For cholesterol. Office visit required for further refills. 90 tablet 0  ? calcium carbonate (TUMS - DOSED IN MG ELEMENTAL CALCIUM) 500 MG chewable tablet Chew 1 tablet by mouth as needed for indigestion or heartburn.    ? Calcium-Magnesium-Vitamin D (CALCIUM 1200+D3 PO) Take 2 tablets by mouth daily. Calcium 1200 units / Vit D 800 mg    ? COLLAGEN PO Take 1 capsule by mouth daily.     ? glucose blood (ACCU-CHEK GUIDE) test strip USE TO CHECK BLOOD GLUCOSE UP TO 4 TIMES DAILY 300 each 2  ? Krill Oil (OMEGA-3) 500 MG CAPS Take 1 capsule by mouth daily.    ? Multiple Vitamins-Minerals (HAIR SKIN AND NAILS FORMULA) TABS Take 1 tablet by mouth daily.     ? OVER THE COUNTER MEDICATION Take 2 capsules by mouth daily at 12 noon. Cholesterol care    ? ?No current facility-administered medications on file prior to visit.  ? ? ?BP (!) 142/88   Pulse 65   Temp 98.6 ?F (37 ?C) (Oral)   Ht '5\' 1"'$  (1.549 m)   Wt 208 lb (94.3 kg)   SpO2 99%   BMI 39.30 kg/m?  ?Objective:  ? Physical Exam ?Cardiovascular:  ?   Rate and Rhythm: Normal rate and regular rhythm.  ?Pulmonary:  ?   Effort: Pulmonary effort is normal.  ?   Breath sounds: Normal breath sounds.  ?Musculoskeletal:  ?   Cervical back: Neck supple.  ?Skin: ?    General: Skin is warm and dry.  ? ? ? ? ? ?   ?Assessment & Plan:  ? ? ? ? ?This visit occurred during the SARS-CoV-2 public health emergency.  Safety protocols were in place, including screening questions prior to the visit, additional usage of staff PPE, and extensive cleaning of exam room while observing appropriate contact time as indicated for disinfecting solutions.  ?

## 2021-09-29 ENCOUNTER — Other Ambulatory Visit: Payer: Self-pay | Admitting: Primary Care

## 2021-09-29 DIAGNOSIS — E119 Type 2 diabetes mellitus without complications: Secondary | ICD-10-CM

## 2021-10-20 ENCOUNTER — Other Ambulatory Visit: Payer: Self-pay | Admitting: Primary Care

## 2021-10-20 DIAGNOSIS — E1165 Type 2 diabetes mellitus with hyperglycemia: Secondary | ICD-10-CM

## 2021-10-22 ENCOUNTER — Other Ambulatory Visit: Payer: Self-pay | Admitting: Primary Care

## 2021-10-22 DIAGNOSIS — E785 Hyperlipidemia, unspecified: Secondary | ICD-10-CM

## 2021-10-22 DIAGNOSIS — E1165 Type 2 diabetes mellitus with hyperglycemia: Secondary | ICD-10-CM

## 2021-11-04 ENCOUNTER — Other Ambulatory Visit (INDEPENDENT_AMBULATORY_CARE_PROVIDER_SITE_OTHER): Payer: Medicare HMO

## 2021-11-04 DIAGNOSIS — E1165 Type 2 diabetes mellitus with hyperglycemia: Secondary | ICD-10-CM

## 2021-11-04 DIAGNOSIS — E785 Hyperlipidemia, unspecified: Secondary | ICD-10-CM | POA: Diagnosis not present

## 2021-11-04 LAB — LIPID PANEL
Cholesterol: 167 mg/dL (ref 0–200)
HDL: 54.5 mg/dL (ref >39.00)
LDL Cholesterol: 99 mg/dL (ref 0–99)
NonHDL: 112.24
Total CHOL/HDL Ratio: 3
Triglycerides: 67 mg/dL (ref 0.0–149.0)
VLDL: 13.4 mg/dL (ref 0.0–40.0)

## 2021-11-04 LAB — HEMOGLOBIN A1C: Hgb A1c MFr Bld: 5.8 % (ref 4.6–6.5)

## 2021-11-24 ENCOUNTER — Ambulatory Visit
Admission: RE | Admit: 2021-11-24 | Discharge: 2021-11-24 | Disposition: A | Discharge status: Home / Self Care | Payer: Medicare HMO | Source: Ambulatory Visit | Attending: Oncology | Admitting: Oncology

## 2021-11-24 DIAGNOSIS — Z1231 Encounter for screening mammogram for malignant neoplasm of breast: Secondary | ICD-10-CM | POA: Diagnosis not present

## 2021-11-24 DIAGNOSIS — Z08 Encounter for follow-up examination after completed treatment for malignant neoplasm: Secondary | ICD-10-CM | POA: Insufficient documentation

## 2021-11-24 DIAGNOSIS — Z853 Personal history of malignant neoplasm of breast: Secondary | ICD-10-CM | POA: Insufficient documentation

## 2021-12-04 DIAGNOSIS — Z0100 Encounter for examination of eyes and vision without abnormal findings: Secondary | ICD-10-CM | POA: Diagnosis not present

## 2021-12-04 DIAGNOSIS — E119 Type 2 diabetes mellitus without complications: Secondary | ICD-10-CM | POA: Diagnosis not present

## 2021-12-04 LAB — HM DIABETES EYE EXAM

## 2021-12-10 ENCOUNTER — Inpatient Hospital Stay: Payer: Medicare HMO | Attending: Oncology | Admitting: Oncology

## 2021-12-10 ENCOUNTER — Encounter: Payer: Self-pay | Admitting: Oncology

## 2021-12-10 DIAGNOSIS — Z853 Personal history of malignant neoplasm of breast: Secondary | ICD-10-CM | POA: Diagnosis not present

## 2021-12-10 DIAGNOSIS — Z08 Encounter for follow-up examination after completed treatment for malignant neoplasm: Secondary | ICD-10-CM | POA: Diagnosis not present

## 2021-12-11 NOTE — Progress Notes (Signed)
I connected with Monica Villanueva on 12/11/21 at  2:15 PM EDT by video enabled telemedicine visit and verified that I am speaking with the correct person using two identifiers.   I discussed the limitations, risks, security and privacy concerns of performing an evaluation and management service by telemedicine and the availability of in-person appointments. I also discussed with the patient that there may be a patient responsible charge related to this service. The patient expressed understanding and agreed to proceed.  Other persons participating in the visit and their role in the encounter:  none  Patient's location:  home Provider's location:  work  Risk analyst Complaint: Routine follow-up of breast cancer  History of present illness: Patient is a 75 year old female with history of invasive lobular carcinoma of the right breast status post right simple mastectomy and sentinel lymph node biopsy in April 2018.  PT1CPN1 mi.  MammaPrint score came back at low risk and she did not require adjuvant chemotherapy.  Patient completed 5 years of adjuvant Arimidex in May 2023.  She did not require adjuvant radiation treatment following mastectomy  Interval history patient is doing well and denies any specificComplaints at this time.  She has completed 5 years of endocrine therapy.  Her recent mammogram from August 2023 was unremarkable.   Review of Systems  Constitutional:  Negative for chills, fever, malaise/fatigue and weight loss.  HENT:  Negative for congestion, ear discharge and nosebleeds.   Eyes:  Negative for blurred vision.  Respiratory:  Negative for cough, hemoptysis, sputum production, shortness of breath and wheezing.   Cardiovascular:  Negative for chest pain, palpitations, orthopnea and claudication.  Gastrointestinal:  Negative for abdominal pain, blood in stool, constipation, diarrhea, heartburn, melena, nausea and vomiting.  Genitourinary:  Negative for dysuria, flank pain, frequency,  hematuria and urgency.  Musculoskeletal:  Negative for back pain, joint pain and myalgias.  Skin:  Negative for rash.  Neurological:  Negative for dizziness, tingling, focal weakness, seizures, weakness and headaches.  Endo/Heme/Allergies:  Does not bruise/bleed easily.  Psychiatric/Behavioral:  Negative for depression and suicidal ideas. The patient does not have insomnia.     No Known Allergies  Past Medical History:  Diagnosis Date   Breast cancer ([place]) 2018   right breast   Cancer ([place])    GERD (gastroesophageal reflux disease)    OCC   Keloid of skin    right axilla   Malignant neoplasm of upper-outer quadrant of right breast in female, estrogen receptor positive ([place]) 08/27/2016   Shingles 01/23/2019    Past Surgical History:  Procedure Laterality Date   ABDOMINAL HYSTERECTOMY  1996   BREAST BIOPSY Right 07/15/2016   stereo bx of calcs LOQ-positive   BREAST BIOPSY Right 07/15/2016   [place] bx of mass-positve   BREAST CYST ASPIRATION Left 07/15/2016   COLONOSCOPY     MASTECTOMY Right 2018   SENTINEL NODE BIOPSY Right 08/11/2016   Procedure: SENTINEL NODE BIOPSY;  Surgeon: Leonie Green, MD;  Location: [facility] ORS;  Service: General;  Laterality: Right;   SIMPLE MASTECTOMY WITH AXILLARY SENTINEL NODE BIOPSY Right 08/11/2016   Procedure: SIMPLE MASTECTOMY;  Surgeon: Leonie Green, MD;  Location: [facility] ORS;  Service: General;  Laterality: Right;    Social History   Socioeconomic History   Marital status: Widowed    Spouse name: Not on file   Number of children: Not on file   Years of education: Not on file   Highest education level: Not on file  Occupational History  Not on file  Tobacco Use   Smoking status: Never   Smokeless tobacco: Never  Vaping Use   Vaping Use: Never used  Substance and Sexual Activity   Alcohol use: Yes    Comment: rare use   Drug use: No   Sexual activity: Not Currently  Other Topics Concern   Not on file  Social History  Narrative   Widowed.   2 children, 4 grandchildren.   Retired. Previously worked at [employer].   Enjoys writing, playing on the key boards.   Social Determinants of Health   Financial Resource Strain: Low Risk  (01/05/2021)   Overall Financial Resource Strain (CARDIA)    Difficulty of Paying Living Expenses: Not hard at all  Food Insecurity: No Food Insecurity (01/05/2021)   Hunger Vital Sign    Worried About Running Out of Food in the Last Year: Never true    Ran Out of Food in the Last Year: Never true  Transportation Needs: No Transportation Needs (01/05/2021)   PRAPARE - Hydrologist (Medical): No    Lack of Transportation (Non-Medical): No  Physical Activity: Sufficiently Active (01/05/2021)   Exercise Vital Sign    Days of Exercise per Week: 3 days    Minutes of Exercise per Session: 50 min  Stress: No Stress Concern Present (01/05/2021)   [place]    Feeling of Stress : Only a little  Social Connections: Moderately Isolated (01/05/2021)   Social Connection and Isolation Panel [NHANES]    Frequency of Communication with Friends and Family: More than three times a week    Frequency of Social Gatherings with Friends and Family: Twice a week    Attends Religious Services: Never    Marine scientist or Organizations: Yes    Attends Archivist Meetings: Never    Marital Status: Widowed  Intimate Partner Violence: Not At Risk (01/05/2021)   Humiliation, Afraid, Rape, and Kick questionnaire    Fear of Current or Ex-Partner: No    Emotionally Abused: No    Physically Abused: No    Sexually Abused: No    Family History  Problem Relation Age of Onset   Hypertension Mother    Hypertension Sister    Breast cancer Sister    Breast cancer Sister      Current Outpatient Medications:    Accu-Chek FastClix Lancets MISC, USE TO CHECK BLOOD GLUCOSE UP TO 4  TIMES DAILY, Disp: 300 each, Rfl: 2   Alcohol Swabs (DROPSAFE ALCOHOL PREP) 70 % PADS, Apply topically., Disp: , Rfl:    atorvastatin (LIPITOR) 20 MG tablet, Take 1 tablet (20 mg total) by mouth daily. for cholesterol., Disp: 90 tablet, Rfl: 1   calcium carbonate (TUMS - DOSED IN MG ELEMENTAL CALCIUM) 500 MG chewable tablet, Chew 1 tablet by mouth as needed for indigestion or heartburn., Disp: , Rfl:    Calcium-Magnesium-Vitamin D (CALCIUM 1200+D3 PO), Take 2 tablets by mouth daily. Calcium 1200 units / Vit D 800 mg, Disp: , Rfl:    COLLAGEN PO, Take 1 capsule by mouth daily. , Disp: , Rfl:    glucose blood (ACCU-CHEK GUIDE) test strip, USE TO CHECK BLOOD GLUCOSE UP TO 4 TIMES DAILY, Disp: 350 strip, Rfl: 1   Krill Oil (OMEGA-3) 500 MG CAPS, Take 1 capsule by mouth daily., Disp: , Rfl:    Multiple Vitamins-Minerals (HAIR SKIN AND NAILS FORMULA) TABS, Take 1 tablet  by mouth daily. , Disp: , Rfl:    OVER THE COUNTER MEDICATION, Take 2 capsules by mouth daily at 12 noon. Cholesterol care, Disp: , Rfl:    anastrozole (ARIMIDEX) 1 MG tablet, TAKE 1 TABLET EVERY DAY (Patient not taking: Reported on 12/10/2021), Disp: 90 tablet, Rfl: 1   glipiZIDE (GLUCOTROL XL) 5 MG 24 hr tablet, Take 5 mg by mouth daily. (Patient not taking: Reported on 12/10/2021), Disp: , Rfl:   MM 3D SCREEN BREAST UNI LEFT  Result Date: 11/25/2021 CLINICAL DATA:  Screening. EXAM: DIGITAL SCREENING UNILATERAL LEFT MAMMOGRAM WITH CAD AND TOMOSYNTHESIS TECHNIQUE: Left screening digital craniocaudal and mediolateral oblique mammograms were obtained. Left screening digital breast tomosynthesis was performed. The images were evaluated with computer-aided detection. COMPARISON:  Previous exam(s). ACR Breast Density Category b: There are scattered areas of fibroglandular density. FINDINGS: The patient has had a right mastectomy. There are no findings suspicious for malignancy. IMPRESSION: No mammographic evidence of malignancy. A result letter of  this screening mammogram will be mailed directly to the patient. RECOMMENDATION: Screening mammogram in one year.  (Code:SM-L-26M) BI-RADS CATEGORY  1: Negative. Electronically Signed   By: Kristopher Oppenheim M.D.   On: 11/25/2021 11:15    No images are attached to the encounter.      Latest Ref Rng & Units 06/12/2021    9:14 AM  CMP  Glucose 70 - 99 mg/dL 165   BUN 6 - 23 mg/dL 11   Creatinine 0.40 - 1.20 mg/dL 0.77   Sodium 135 - 145 mEq/L 141   Potassium 3.5 - 5.1 mEq/L 3.7   Chloride 96 - 112 mEq/L 106   CO2 19 - 32 mEq/L 26   Calcium 8.4 - 10.5 mg/dL 9.5   Total Protein 6.0 - 8.3 g/dL 7.9   Total Bilirubin 0.2 - 1.2 mg/dL 0.6   Alkaline Phos 39 - 117 U/L 75   AST 0 - 37 U/L 21   ALT 0 - 35 U/L 16       Latest Ref Rng & Units 06/06/2020    7:35 AM  CBC  WBC 4.0 - 10.5 K/uL 7.1   Hemoglobin 12.0 - 15.0 g/dL 12.4   Hematocrit 36.0 - 46.0 % 37.7   Platelets 150.0 - 400.0 K/uL 188.0      Observation/objective: Appears in no acute distress over video visit today.  Breathing is nonlabored  Assessment and plan: Patient is a 76 year old female with stage I invasive mammary carcinoma of the right breast ER/PR positive HER2 negative s/p right mastectomy and 5 years of Arimidex.  This is a routine follow-up visit  Recent mammogram from this month was normal.  Patient has completed 5 years of endocrine therapy in May 2023.  At this time patient can continue to follow-up with her primary care provider for yearly breast exams and yearly mammograms.  No follow-up needed with oncology and she can be referred to [place] in the future if questions or concerns arise  Follow-up instructions: No follow-up needed  I discussed the assessment and treatment plan with the patient. The patient was provided an opportunity to ask questions and all were answered. The patient agreed with the plan and demonstrated an understanding of the instructions.   The patient was advised to call back or seek an in-person  evaluation if the symptoms worsen or if the condition fails to improve as anticipated.  I provided 12 minutes of face-to-face video visit time during this encounter, and > 50% was spent counseling  as documented under my assessment & plan.  Visit Diagnosis: 1. Encounter for follow-up surveillance of breast cancer     Dr. Randa Evens, MD, MPH [facility] at [facility] Tel- [phone] 12/11/2021 10:54 AM

## 2021-12-18 ENCOUNTER — Encounter: Payer: Self-pay | Admitting: Primary Care

## 2022-01-06 ENCOUNTER — Ambulatory Visit: Payer: Medicare HMO

## 2022-01-09 ENCOUNTER — Ambulatory Visit (INDEPENDENT_AMBULATORY_CARE_PROVIDER_SITE_OTHER): Payer: Medicare HMO

## 2022-01-09 ENCOUNTER — Telehealth: Payer: Self-pay

## 2022-01-09 VITALS — BP 137/81 | Wt 204.0 lb

## 2022-01-09 DIAGNOSIS — Z853 Personal history of malignant neoplasm of breast: Secondary | ICD-10-CM

## 2022-01-09 DIAGNOSIS — Z0000 Encounter for general adult medical examination without abnormal findings: Secondary | ICD-10-CM | POA: Diagnosis not present

## 2022-01-09 NOTE — Telephone Encounter (Signed)
Needs rx for r breast prosthesis

## 2022-01-09 NOTE — Telephone Encounter (Signed)
Do you know how to obtain this? Do I need to write a paper script?

## 2022-01-09 NOTE — Progress Notes (Signed)
Subjective:   Marynell Bies is a 76 y.o. female who presents for Medicare Annual (Subsequent) preventive examination.  I connected with  [name] on 01/09/22 by a audio enabled telemedicine application and verified that I am speaking with the correct person using two identifiers.  Patient Location: Home  Provider Location: Other:  [place]  I discussed the limitations of evaluation and management by telemedicine. The patient expressed understanding and agreed to proceed.   Review of Systems     Cardiac Risk Factors include: advanced age (>38mn, >>65women);diabetes mellitus;dyslipidemia;hypertension;obesity (BMI >30kg/m2)     Objective:    Today's Vitals   01/09/22 1131  BP: 137/81  Weight: 204 lb (92.5 kg)   Body mass index is 38.55 kg/m.     01/09/2022   11:40 AM 12/10/2021    1:38 PM 05/30/2021    1:04 PM 11/25/2020    2:38 PM 06/06/2019   11:12 AM 05/09/2019    9:03 AM 11/03/2018    2:16 PM  Advanced Directives  Does Patient Have a Medical Advance Directive? No No No No No No No  Would patient like information on creating a medical advance directive? No - Patient declined No - Patient declined   No - Patient declined No - Patient declined No - Patient declined    Current Medications (verified) Outpatient Encounter Medications as of 01/09/2022  Medication Sig   Accu-Chek FastClix Lancets MISC USE TO CHECK BLOOD GLUCOSE UP TO 4 TIMES DAILY   Alcohol Swabs (DROPSAFE ALCOHOL PREP) 70 % PADS Apply topically.   atorvastatin (LIPITOR) 20 MG tablet Take 1 tablet (20 mg total) by mouth daily. for cholesterol.   calcium carbonate (TUMS - DOSED IN MG ELEMENTAL CALCIUM) 500 MG chewable tablet Chew 1 tablet by mouth as needed for indigestion or heartburn.   Calcium-Magnesium-Vitamin D (CALCIUM 1200+D3 PO) Take 2 tablets by mouth daily. Calcium 1200 units / Vit D 800 mg   COLLAGEN PO Take 1 capsule by mouth daily.    glucose blood (ACCU-CHEK GUIDE) test strip USE TO  CHECK BLOOD GLUCOSE UP TO 4 TIMES DAILY   Krill Oil (OMEGA-3) 500 MG CAPS Take 1 capsule by mouth daily.   Multiple Vitamins-Minerals (HAIR SKIN AND NAILS FORMULA) TABS Take 1 tablet by mouth daily.    OVER THE COUNTER MEDICATION Take 2 capsules by mouth daily at 12 noon. Cholesterol care   [DISCONTINUED] anastrozole (ARIMIDEX) 1 MG tablet TAKE 1 TABLET EVERY DAY (Patient not taking: Reported on 12/10/2021)   [DISCONTINUED] glipiZIDE (GLUCOTROL XL) 5 MG 24 hr tablet Take 5 mg by mouth daily. (Patient not taking: Reported on 12/10/2021)   No facility-administered encounter medications on file as of 01/09/2022.    Allergies (verified) Patient has no known allergies.   History: Past Medical History:  Diagnosis Date   [facility]   right breast   Can[facility] ([facility]    GERD (gastroesophageal reflux disease)    OCC   Keloid of skin    right axilla   Malignant neoplasm of upper-outer quadrant of right breast in female, estrogen receptor positive ([facility] 08/27/2016   Shingles 01/23/2019   Past Surgical History:  Procedure Laterality Date   ABDOMINAL HYSTERECTOMY  1996   BREAST BIOPSY Right 07/15/2016   stereo bx of calcs LOQ-positive   BREAST BIOPSY Right 07/15/2016   uKoreabx of mass-positve   BREAST CYST ASPIRATION Left 07/15/2016   COLONOSCOPY     MASTECTOMY Right 2018   SENTINEL  NODE BIOPSY Right 08/11/2016   Procedure: SENTINEL NODE BIOPSY;  Surgeon: Leonie Green, MD;  Location: [facility] ORS;  Service: General;  Laterality: Right;   SIMPLE MASTECTOMY WITH AXILLARY SENTINEL NODE BIOPSY Right 08/11/2016   Procedure: SIMPLE MASTECTOMY;  Surgeon: Leonie Green, MD;  Location: [facility] ORS;  Service: General;  Laterality: Right;   Family History  Problem Relation Age of Onset   Hypertension Mother    Hypertension Sister    Breast cancer Sister    Breast cancer Sister    Social History   Socioeconomic History   Marital status: Widowed    Spouse name: Not on file   Number  of children: 2   Years of education: Not on file   Highest education level: Not on file  Occupational History   Occupation: retired  Tobacco Use   Smoking status: Never   Smokeless tobacco: Never  Vaping Use   Vaping Use: Never used  Substance and Sexual Activity   Alcohol use: Yes    Comment: rare use   Drug use: No   Sexual activity: Not Currently  Other Topics Concern   Not on file  Social History Narrative   Widowed.   2 children, 4 grandchildren.   Retired. Previously worked at [employer].   Enjoys writing, playing on the key boards.   Daughter and son in law live with her   Social Determinants of Health   Financial Resource Strain: Low Risk  (01/09/2022)   Overall Financial Resource Strain (CARDIA)    Difficulty of Paying Living Expenses: Not hard at all  Food Insecurity: No Food Insecurity (01/09/2022)   Hunger Vital Sign    Worried About Running Out of Food in the Last Year: Never true    Ran Out of Food in the Last Year: Never true  Transportation Needs: No Transportation Needs (01/09/2022)   PRAPARE - Hydrologist (Medical): No    Lack of Transportation (Non-Medical): No  Physical Activity: Sufficiently Active (01/09/2022)   Exercise Vital Sign    Days of Exercise per Week: 7 days    Minutes of Exercise per Session: 40 min  Stress: No Stress Concern Present (01/09/2022)   [place]    Feeling of Stress : Not at all  Social Connections: Socially Isolated (01/09/2022)   Social Connection and Isolation Panel [NHANES]    Frequency of Communication with Friends and Family: More than three times a week    Frequency of Social Gatherings with Friends and Family: More than three times a week    Attends Religious Services: Never    Marine scientist or Organizations: No    Attends Archivist Meetings: Never    Marital Status: Widowed     Tobacco Counseling Counseling given: Not Answered   Clinical Intake:  Pre-visit preparation completed: Yes  Pain : No/denies pain     BMI - recorded: 38.55 Nutritional Status: BMI > 30  Obese Nutritional Risks: None Diabetes: Yes CBG done?: No Did pt. bring in CBG monitor from home?: No  How often do you need to have someone help you when you read instructions, pamphlets, or other written materials from your doctor or pharmacy?: 1 - Never  Diabetic? Nutrition Risk Assessment:  Has the patient had any N/V/D within the last 2 months?  No  Does the patient have any non-healing wounds?  No  Has the patient had any  unintentional weight loss or weight gain?  No   Diabetes:  Is the patient diabetic?  Yes  If diabetic, was a CBG obtained today?  No  Did the patient bring in their glucometer from home?  No  How often do you monitor your CBG's? Once daily fasting - 138 this am per patient.   Financial Strains and Diabetes Management:  Are you having any financial strains with the device, your supplies or your medication? No .  Does the patient want to be seen by Chronic Care Management for management of their diabetes?  No  Would the patient like to be referred to a Nutritionist or for Diabetic Management?  No   Diabetic Exams:  Diabetic Eye Exam: Completed 12/04/2021 Diabetic Foot Exam: Completed 12/10/2020   Interpreter Needed?: No  Information entered by :: Tonnie Stillman, LPN   Activities of Daily Living    01/09/2022   11:41 AM 06/12/2021    8:45 AM  In your present state of health, do you have any difficulty performing the following activities:  Hearing? 0 0  Vision? 0 0  Difficulty concentrating or making decisions? 0 0  Walking or climbing stairs? 0 0  Dressing or bathing? 0 0  Doing errands, shopping? 0 0  Preparing Food and eating ? N   Using the Toilet? N   In the past six months, have you accidently leaked urine? N   Do you have problems with loss of  bowel control? N   Managing your Medications? N   Managing your Finances? N   Housekeeping or managing your Housekeeping? N     Patient Care Team: Pleas Koch, NP as PCP - General (Internal Medicine) Leandrew Koyanagi, MD as Referring Physician (Ophthalmology)  Indicate any recent Medical Services you may have received from other than [facility] providers in the past year (date may be approximate).     Assessment:   This is a routine wellness examination for [place].  Hearing/Vision screen Hearing Screening - Comments:: Denies hearing difficulties   Vision Screening - Comments:: Wears rx glasses - up to date with routine eye exams with [facility]  Dietary issues and exercise activities discussed: Current Exercise Habits: Home exercise routine, Type of exercise: walking;stretching, Time (Minutes): 40, Frequency (Times/Week): 7, Weekly Exercise (Minutes/Week): 280, Intensity: Mild, Exercise limited by: None identified   Goals Addressed             This Visit's Progress    Patient Stated   On track    01/09/22 - I will continue to exercise everyday for about 30 minutes; continue to travel       Depression Screen    01/09/2022   11:39 AM 06/12/2021    8:45 AM 01/05/2021   10:12 AM 01/05/2021   10:06 AM 06/11/2020    7:33 AM 06/06/2019   11:14 AM 05/30/2018   10:58 AM  PHQ 2/9 Scores  PHQ - 2 Score 0 0 0 0 0 0 0  PHQ- 9 Score  0   2 0 0    Fall Risk    01/09/2022   11:34 AM 06/12/2021    8:46 AM 01/05/2021   10:12 AM 06/11/2020    7:35 AM 06/06/2019   11:13 AM  Fall Risk   Falls in the past year? 0 1 0 0 0  Number falls in past yr: 0 0 0 0 0  Injury with Fall? 0 0 0 0 0  Risk for fall due to : No Fall  Risks  No Fall Risks  Medication side effect  Follow up Falls prevention discussed Falls evaluation completed   Falls evaluation completed;Falls prevention discussed    FALL RISK PREVENTION PERTAINING TO THE HOME:  Any stairs in or around the home? Yes  If so, are  there any without handrails? No  Home free of loose throw rugs in walkways, pet beds, electrical cords, etc? Yes  Adequate lighting in your home to reduce risk of falls? Yes   ASSISTIVE DEVICES UTILIZED TO PREVENT FALLS:  Life alert? No  Use of a cane, walker or w/c? No  Grab bars in the bathroom? Yes  Shower chair or bench in shower? No  Elevated toilet seat or a handicapped toilet? No   TIMED UP AND GO:  Was the test performed? No . Telephonic visit  Cognitive Function:    06/06/2019   11:15 AM 05/30/2018   10:58 AM 05/14/2017    8:30 AM  MMSE - Mini Mental State Exam  Orientation to time '5 5 5  '$ Orientation to Place '5 5 5  '$ Registration '3 3 3  '$ Attention/ Calculation 5 0 0  Recall '3 3 3  '$ Language- name 2 objects  0 0  Language- repeat '1 1 1  '$ Language- follow 3 step command  3 3  Language- read & follow direction  0 0  Write a sentence  0 0  Copy design  0 0  Total score  20 20        01/09/2022   11:42 AM 01/05/2021   10:14 AM  6CIT Screen  What Year? 0 points 0 points  What month? 0 points 0 points  What time? 0 points 0 points  Count back from 20 0 points 2 points  Months in reverse 0 points 4 points  Repeat phrase 0 points 0 points  Total Score 0 points 6 points    Immunizations Immunization History  Administered Date(s) Administered   Fluad Quad(high Dose 65+) 12/21/2018, 06/11/2020   Influenza,inj,Quad PF,6+ Mos 05/13/2016, 05/14/2017, 05/30/2018   PFIZER(Purple Top)SARS-COV-2 Vaccination 06/14/2019, 07/12/2019, 03/11/2020   Pneumococcal Conjugate-13 05/14/2017   Pneumococcal Polysaccharide-23 05/30/2018   Zoster Recombinat (Shingrix) 06/02/2018, 12/21/2018    TDAP status: Due, Education has been provided regarding the importance of this vaccine. Advised may receive this vaccine at local pharmacy or Health Dept. Aware to provide a copy of the vaccination record if obtained from local pharmacy or Health Dept. Verbalized acceptance and understanding.  Flu  Vaccine status: Due, Education has been provided regarding the importance of this vaccine. Advised may receive this vaccine at local pharmacy or Health Dept. Aware to provide a copy of the vaccination record if obtained from local pharmacy or Health Dept. Verbalized acceptance and understanding.  Pneumococcal vaccine status: Up to date  Covid-19 vaccine status: Information provided on how to obtain vaccines.   Qualifies for Shingles Vaccine? Yes   Zostavax completed Yes   Shingrix Completed?: Yes  Screening Tests Health Maintenance  Topic Date Due   TETANUS/TDAP  Never done   COVID-19 Vaccine (4 - Pfizer risk series) 05/06/2020   INFLUENZA VACCINE  11/18/2021   Diabetic kidney evaluation - Urine ACR  12/10/2021   FOOT EXAM  12/10/2021   HEMOGLOBIN A1C  05/07/2022   Diabetic kidney evaluation - GFR measurement  06/12/2022   DEXA SCAN  07/05/2022   MAMMOGRAM  11/25/2022   OPHTHALMOLOGY EXAM  12/05/2022   Pneumonia Vaccine 74+ Years old  Completed   Hepatitis C Screening  Completed   Zoster Vaccines- Shingrix  Completed   HPV VACCINES  Aged Out   Fecal DNA (Cologuard)  Discontinued    Health Maintenance  Health Maintenance Due  Topic Date Due   TETANUS/TDAP  Never done   COVID-19 Vaccine (4 - Pfizer risk series) 05/06/2020   INFLUENZA VACCINE  11/18/2021   Diabetic kidney evaluation - Urine ACR  12/10/2021   FOOT EXAM  12/10/2021    Colorectal Cancer Screening: No longer required  Mammogram status: Completed 11/24/2021. Repeat every year  Bone Density status: Completed 07/04/2020. Results reflect: Bone density results: OSTEOPENIA. Repeat every 2 years.  Lung Cancer Screening: (Low Dose CT Chest recommended if Age 37-80 years, 30 pack-year currently smoking OR have quit w/in 15years.) does not qualify  Additional Screening:  Hepatitis C Screening: does qualify; Completed 12/04/2021  Vision Screening: Recommended annual ophthalmology exams for early detection of glaucoma  and other disorders of the eye. Is the patient up to date with their annual eye exam?  Yes  Who is the provider or what is the name of the office in which the patient attends annual eye exams? [facility] If pt is not established with a provider, would they like to be referred to a provider to establish care? No .   Dental Screening: Recommended annual dental exams for proper oral hygiene  Community Resource Referral / Chronic Care Management: CRR required this visit?  No   CCM required this visit?  No      Plan:     I have personally reviewed and noted the following in the patient's chart:   Medical and social history Use of alcohol, tobacco or illicit drugs  Current medications and supplements including opioid prescriptions. Patient is not currently taking opioid prescriptions. Functional ability and status Nutritional status Physical activity Advanced directives List of other physicians Hospitalizations, surgeries, and ER visits in previous 12 months Vitals Screenings to include cognitive, depression, and falls Referrals and appointments  In addition, I have reviewed and discussed with patient certain preventive protocols, quality metrics, and best practice recommendations. A written personalized care plan for preventive services as well as general preventive health recommendations were provided to patient.     Sandrea Hammond, LPN   [id number]   Nurse Notes: Needs rx for breast prosthesis - separate note sent

## 2022-01-09 NOTE — Telephone Encounter (Signed)
I have pended the DME order once you  sign I can reach out to patient and see where she would like to have it sent.

## 2022-01-09 NOTE — Patient Instructions (Addendum)
Monica Villanueva , Thank you for taking time to come for your Medicare Wellness Visit. I appreciate your ongoing commitment to your health goals. Please review the following plan we discussed and let me know if I can assist you in the future.   These are the goals we discussed:  Goals      Patient Stated     01/09/22 - I will continue to exercise everyday for about 30 minutes; continue to travel        This is a list of the screening recommended for you and due dates:  Health Maintenance  Topic Date Due   Tetanus Vaccine  Never done   COVID-19 Vaccine (4 - Pfizer risk series) 05/06/2020   Flu Shot  11/18/2021   Yearly kidney health urinalysis for diabetes  12/10/2021   Complete foot exam   12/10/2021   Hemoglobin A1C  05/07/2022   Yearly kidney function blood test for diabetes  06/12/2022   DEXA scan (bone density measurement)  07/05/2022   Mammogram  11/25/2022   Eye exam for diabetics  12/05/2022   Pneumonia Vaccine  Completed   Hepatitis C Screening: USPSTF Recommendation to screen - Ages 43-79 yo.  Completed   Zoster (Shingles) Vaccine  Completed   HPV Vaccine  Aged Out   Cologuard (Stool DNA test)  Discontinued    Advanced directives: Advance directive discussed with you today. Even though you declined this today, please call our office should you change your mind, and we can give you the proper paperwork for you to fill out.   Conditions/risks identified: Continue working on exercising - monitor sugar - see end of summary for Diabetic diet  Next appointment: Follow up in one year for your annual wellness visit    Preventive Care 65 Years and Older, Female Preventive care refers to lifestyle choices and visits with your health care provider that can promote health and wellness. What does preventive care include? A yearly physical exam. This is also called an annual well check. Dental exams once or twice a year. Routine eye exams. Ask your health care provider how often you  should have your eyes checked. Personal lifestyle choices, including: Daily care of your teeth and gums. Regular physical activity. Eating a healthy diet. Avoiding tobacco and drug use. Limiting alcohol use. Practicing safe sex. Taking low-dose aspirin every day. Taking vitamin and mineral supplements as recommended by your health care provider. What happens during an annual well check? The services and screenings done by your health care provider during your annual well check will depend on your age, overall health, lifestyle risk factors, and family history of disease. Counseling  Your health care provider may ask you questions about your: Alcohol use. Tobacco use. Drug use. Emotional well-being. Home and relationship well-being. Sexual activity. Eating habits. History of falls. Memory and ability to understand (cognition). Work and work Statistician. Reproductive health. Screening  You may have the following tests or measurements: Height, weight, and BMI. Blood pressure. Lipid and cholesterol levels. These may be checked every 5 years, or more frequently if you are over 63 years old. Skin check. Lung cancer screening. You may have this screening every year starting at age 56 if you have a 30-pack-year history of smoking and currently smoke or have quit within the past 15 years. Fecal occult blood test (FOBT) of the stool. You may have this test every year starting at age 55. Flexible sigmoidoscopy or colonoscopy. You may have a sigmoidoscopy every 5 years or  a colonoscopy every 10 years starting at age 65. Hepatitis C blood test. Hepatitis B blood test. Sexually transmitted disease (STD) testing. Diabetes screening. This is done by checking your blood sugar (glucose) after you have not eaten for a while (fasting). You may have this done every 1-3 years. Bone density scan. This is done to screen for osteoporosis. You may have this done starting at age 9. Mammogram. This may  be done every 1-2 years. Talk to your health care provider about how often you should have regular mammograms. Talk with your health care provider about your test results, treatment options, and if necessary, the need for more tests. Vaccines  Your health care provider may recommend certain vaccines, such as: Influenza vaccine. This is recommended every year. Tetanus, diphtheria, and acellular pertussis (Tdap, Td) vaccine. You may need a Td booster every 10 years. Zoster vaccine. You may need this after age 56. Pneumococcal 13-valent conjugate (PCV13) vaccine. One dose is recommended after age 53. Pneumococcal polysaccharide (PPSV23) vaccine. One dose is recommended after age 72. Talk to your health care provider about which screenings and vaccines you need and how often you need them. This information is not intended to replace advice given to you by your health care provider. Make sure you discuss any questions you have with your health care provider. Document Released: 05/03/2015 Document Revised: 12/25/2015 Document Reviewed: 02/05/2015 Elsevier Interactive Patient Education  2017 [place] Prevention in the Home Falls can cause injuries. They can happen to people of all ages. There are many things you can do to make your home safe and to help prevent falls. What can I do on the outside of my home? Regularly fix the edges of walkways and driveways and fix any cracks. Remove anything that might make you trip as you walk through a door, such as a raised step or threshold. Trim any bushes or trees on the path to your home. Use bright outdoor lighting. Clear any walking paths of anything that might make someone trip, such as rocks or tools. Regularly check to see if handrails are loose or broken. Make sure that both sides of any steps have handrails. Any raised decks and porches should have guardrails on the edges. Have any leaves, snow, or ice cleared regularly. Use sand or salt  on walking paths during winter. Clean up any spills in your garage right away. This includes oil or grease spills. What can I do in the bathroom? Use night lights. Install grab bars by the toilet and in the tub and shower. Do not use towel bars as grab bars. Use non-skid mats or decals in the tub or shower. If you need to sit down in the shower, use a plastic, non-slip stool. Keep the floor dry. Clean up any water that spills on the floor as soon as it happens. Remove soap buildup in the tub or shower regularly. Attach bath mats securely with double-sided non-slip rug tape. Do not have throw rugs and other things on the floor that can make you trip. What can I do in the bedroom? Use night lights. Make sure that you have a light by your bed that is easy to reach. Do not use any sheets or blankets that are too big for your bed. They should not hang down onto the floor. Have a firm chair that has side arms. You can use this for support while you get dressed. Do not have throw rugs and other things on the floor that  can make you trip. What can I do in the kitchen? Clean up any spills right away. Avoid walking on wet floors. Keep items that you use a lot in easy-to-reach places. If you need to reach something above you, use a strong step stool that has a grab bar. Keep electrical cords out of the way. Do not use floor polish or wax that makes floors slippery. If you must use wax, use non-skid floor wax. Do not have throw rugs and other things on the floor that can make you trip. What can I do with my stairs? Do not leave any items on the stairs. Make sure that there are handrails on both sides of the stairs and use them. Fix handrails that are broken or loose. Make sure that handrails are as long as the stairways. Check any carpeting to make sure that it is firmly attached to the stairs. Fix any carpet that is loose or worn. Avoid having throw rugs at the top or bottom of the stairs. If you  do have throw rugs, attach them to the floor with carpet tape. Make sure that you have a light switch at the top of the stairs and the bottom of the stairs. If you do not have them, ask someone to add them for you. What else can I do to help prevent falls? Wear shoes that: Do not have high heels. Have rubber bottoms. Are comfortable and fit you well. Are closed at the toe. Do not wear sandals. If you use a stepladder: Make sure that it is fully opened. Do not climb a closed stepladder. Make sure that both sides of the stepladder are locked into place. Ask someone to hold it for you, if possible. Clearly mark and make sure that you can see: Any grab bars or handrails. First and last steps. Where the edge of each step is. Use tools that help you move around (mobility aids) if they are needed. These include: Canes. Walkers. Scooters. Crutches. Turn on the lights when you go into a dark area. Replace any light bulbs as soon as they burn out. Set up your furniture so you have a clear path. Avoid moving your furniture around. If any of your floors are uneven, fix them. If there are any pets around you, be aware of where they are. Review your medicines with your doctor. Some medicines can make you feel dizzy. This can increase your chance of falling. Ask your doctor what other things that you can do to help prevent falls. This information is not intended to replace advice given to you by your health care provider. Make sure you discuss any questions you have with your health care provider. Document Released: 01/31/2009 Document Revised: 09/12/2015 Document Reviewed: 05/11/2014 Elsevier Interactive Patient Education  2017 [place].   Diabetes Mellitus and Nutrition, Adult When you have diabetes, or diabetes mellitus, it is very important to have healthy eating habits because your blood sugar (glucose) levels are greatly affected by what you eat and drink. Eating healthy foods in the  right amounts, at about the same times every day, can help you: Manage your blood glucose. Lower your risk of heart disease. Improve your blood pressure. Reach or maintain a healthy weight. What can affect my meal plan? Every person with diabetes is different, and each person has different needs for a meal plan. Your health care provider may recommend that you work with a dietitian to make a meal plan that is best for you. Your meal  plan may vary depending on factors such as: The calories you need. The medicines you take. Your weight. Your blood glucose, blood pressure, and cholesterol levels. Your activity level. Other health conditions you have, such as heart or kidney disease. How do carbohydrates affect me? Carbohydrates, also called carbs, affect your blood glucose level more than any other type of food. Eating carbs raises the amount of glucose in your blood. It is important to know how many carbs you can safely have in each meal. This is different for every person. Your dietitian can help you calculate how many carbs you should have at each meal and for each snack. How does alcohol affect me? Alcohol can cause a decrease in blood glucose (hypoglycemia), especially if you use insulin or take certain diabetes medicines by mouth. Hypoglycemia can be a life-threatening condition. Symptoms of hypoglycemia, such as sleepiness, dizziness, and confusion, are similar to symptoms of having too much alcohol. Do not drink alcohol if: Your health care provider tells you not to drink. You are pregnant, may be pregnant, or are planning to become pregnant. If you drink alcohol: Limit how much you have to: 0-1 drink a day for women. 0-2 drinks a day for men. Know how much alcohol is in your drink. In the [place] one drink equals one 12 oz bottle of beer (355 mL), one 5 oz glass of wine (148 mL), or one 1 oz glass of hard liquor (44 mL). Keep yourself hydrated with water, diet soda, or unsweetened iced  tea. Keep in mind that regular soda, juice, and other mixers may contain a lot of sugar and must be counted as carbs. What are tips for following this plan?  Reading food labels Start by checking the serving size on the Nutrition Facts label of packaged foods and drinks. The number of calories and the amount of carbs, fats, and other nutrients listed on the label are based on one serving of the item. Many items contain more than one serving per package. Check the total grams (g) of carbs in one serving. Check the number of grams of saturated fats and trans fats in one serving. Choose foods that have a low amount or none of these fats. Check the number of milligrams (mg) of salt (sodium) in one serving. Most people should limit total sodium intake to less than 2,300 mg per day. Always check the nutrition information of foods labeled as "low-fat" or "nonfat." These foods may be higher in added sugar or refined carbs and should be avoided. Talk to your dietitian to identify your daily goals for nutrients listed on the label. Shopping Avoid buying canned, pre-made, or processed foods. These foods tend to be high in fat, sodium, and added sugar. Shop around the outside edge of the grocery store. This is where you will most often find fresh fruits and vegetables, bulk grains, fresh meats, and fresh dairy products. Cooking Use low-heat cooking methods, such as baking, instead of high-heat cooking methods, such as deep frying. Cook using healthy oils, such as olive, canola, or sunflower oil. Avoid cooking with butter, cream, or high-fat meats. Meal planning Eat meals and snacks regularly, preferably at the same times every day. Avoid going long periods of time without eating. Eat foods that are high in fiber, such as fresh fruits, vegetables, beans, and whole grains. Eat 4-6 oz (112-168 g) of lean protein each day, such as lean meat, chicken, fish, eggs, or tofu. One ounce (oz) (28 g) of lean protein is  equal to: 1 oz (28 g) of meat, chicken, or fish. 1 egg.  cup (62 g) of tofu. Eat some foods each day that contain healthy fats, such as avocado, nuts, seeds, and fish. What foods should I eat? Fruits Berries. Apples. Oranges. Peaches. Apricots. Plums. Grapes. Mangoes. Papayas. Pomegranates. Kiwi. Cherries. Vegetables Leafy greens, including lettuce, spinach, kale, chard, collard greens, mustard greens, and cabbage. Beets. Cauliflower. Broccoli. Carrots. Green beans. Tomatoes. Peppers. Onions. Cucumbers. Brussels sprouts. Grains Whole grains, such as whole-wheat or whole-grain bread, crackers, tortillas, cereal, and pasta. Unsweetened oatmeal. Quinoa. Devan or wild rice. Meats and other proteins Seafood. Poultry without skin. Lean cuts of poultry and beef. Tofu. Nuts. Seeds. Dairy Low-fat or fat-free dairy products such as milk, yogurt, and cheese. The items listed above may not be a complete list of foods and beverages you can eat and drink. Contact a dietitian for more information. What foods should I avoid? Fruits Fruits canned with syrup. Vegetables Canned vegetables. Frozen vegetables with butter or cream sauce. Grains Refined white flour and flour products such as bread, pasta, snack foods, and cereals. Avoid all processed foods. Meats and other proteins Fatty cuts of meat. Poultry with skin. Breaded or fried meats. Processed meat. Avoid saturated fats. Dairy Full-fat yogurt, cheese, or milk. Beverages Sweetened drinks, such as soda or iced tea. The items listed above may not be a complete list of foods and beverages you should avoid. Contact a dietitian for more information. Questions to ask a health care provider Do I need to meet with a certified diabetes care and education specialist? Do I need to meet with a dietitian? What number can I call if I have questions? When are the best times to check my blood glucose? Where to find more information: American Diabetes  Association: diabetes.org Academy of Nutrition and Dietetics: eatright.[employer] of Diabetes and Digestive and Kidney Diseases: [employer] Association of Diabetes Care & Education Specialists: diabeteseducator.org Summary It is important to have healthy eating habits because your blood sugar (glucose) levels are greatly affected by what you eat and drink. It is important to use alcohol carefully. A healthy meal plan will help you manage your blood glucose and lower your risk of heart disease. Your health care provider may recommend that you work with a dietitian to make a meal plan that is best for you. This information is not intended to replace advice given to you by your health care provider. Make sure you discuss any questions you have with your health care provider. Document Revised: 11/08/2019 Document Reviewed: 11/08/2019 Elsevier Patient Education  [place].

## 2022-01-09 NOTE — Addendum Note (Signed)
Addended by: Francella Solian on: 01/09/2022 02:45 PM   Modules accepted: Orders

## 2022-01-09 NOTE — Addendum Note (Signed)
Addended by: Pleas Koch on: 01/09/2022 03:18 PM   Modules accepted: Orders

## 2022-01-09 NOTE — Telephone Encounter (Signed)
Order signed.

## 2022-01-12 NOTE — Telephone Encounter (Signed)
Patient notified and will be coming to pick up

## 2022-01-22 DIAGNOSIS — C50111 Malignant neoplasm of central portion of right female breast: Secondary | ICD-10-CM | POA: Diagnosis not present

## 2022-01-22 DIAGNOSIS — Z9011 Acquired absence of right breast and nipple: Secondary | ICD-10-CM | POA: Diagnosis not present

## 2022-01-22 DIAGNOSIS — Z4431 Encounter for fitting and adjustment of external right breast prosthesis: Secondary | ICD-10-CM | POA: Diagnosis not present

## 2022-06-16 ENCOUNTER — Encounter: Payer: Self-pay | Admitting: Primary Care

## 2022-06-16 ENCOUNTER — Ambulatory Visit (INDEPENDENT_AMBULATORY_CARE_PROVIDER_SITE_OTHER): Payer: Medicare HMO | Admitting: Primary Care

## 2022-06-16 VITALS — BP 134/82 | HR 61 | Temp 97.3°F | Ht 61.0 in | Wt 202.0 lb

## 2022-06-16 DIAGNOSIS — I10 Essential (primary) hypertension: Secondary | ICD-10-CM

## 2022-06-16 DIAGNOSIS — Z1211 Encounter for screening for malignant neoplasm of colon: Secondary | ICD-10-CM | POA: Diagnosis not present

## 2022-06-16 DIAGNOSIS — Z23 Encounter for immunization: Secondary | ICD-10-CM | POA: Diagnosis not present

## 2022-06-16 DIAGNOSIS — E785 Hyperlipidemia, unspecified: Secondary | ICD-10-CM

## 2022-06-16 DIAGNOSIS — Z853 Personal history of malignant neoplasm of breast: Secondary | ICD-10-CM | POA: Diagnosis not present

## 2022-06-16 DIAGNOSIS — E1165 Type 2 diabetes mellitus with hyperglycemia: Secondary | ICD-10-CM

## 2022-06-16 DIAGNOSIS — E2839 Other primary ovarian failure: Secondary | ICD-10-CM | POA: Diagnosis not present

## 2022-06-16 DIAGNOSIS — Z0000 Encounter for general adult medical examination without abnormal findings: Secondary | ICD-10-CM | POA: Diagnosis not present

## 2022-06-16 LAB — LIPID PANEL
Cholesterol: 216 mg/dL — ABNORMAL HIGH (ref 0–200)
HDL: 55.7 mg/dL (ref >39.00)
LDL Cholesterol: 149 mg/dL — ABNORMAL HIGH (ref 0–99)
NonHDL: 160.75
Total CHOL/HDL Ratio: 4
Triglycerides: 61 mg/dL (ref 0.0–149.0)
VLDL: 12.2 mg/dL (ref 0.0–40.0)

## 2022-06-16 LAB — COMPREHENSIVE METABOLIC PANEL
ALT: 11 U/L (ref 0–35)
AST: 18 U/L (ref 0–37)
Albumin: 4 g/dL (ref 3.5–5.2)
Alkaline Phosphatase: 69 U/L (ref 39–117)
BUN: 8 mg/dL (ref 6–23)
CO2: 30 mEq/L (ref 19–32)
Calcium: 9.7 mg/dL (ref 8.4–10.5)
Chloride: 104 mEq/L (ref 96–112)
Creatinine, Ser: 0.69 mg/dL (ref 0.40–1.20)
GFR: 84.17 mL/min (ref >60.00)
Glucose, Bld: 119 mg/dL — ABNORMAL HIGH (ref 70–99)
Potassium: 4.4 mEq/L (ref 3.5–5.1)
Sodium: 140 mEq/L (ref 135–145)
Total Bilirubin: 0.7 mg/dL (ref 0.2–1.2)
Total Protein: 7.4 g/dL (ref 6.0–8.3)

## 2022-06-16 LAB — MICROALBUMIN / CREATININE URINE RATIO
Creatinine,U: 125.7 mg/dL
Microalb Creat Ratio: 0.9 mg/g (ref 0.0–30.0)
Microalb, Ur: 1.2 mg/dL (ref 0.0–1.9)

## 2022-06-16 LAB — CBC
HCT: 38.7 % (ref 36.0–46.0)
Hemoglobin: 12.6 g/dL (ref 12.0–15.0)
MCHC: 32.5 g/dL (ref 30.0–36.0)
MCV: 95.7 fl (ref 78.0–100.0)
Platelets: 200 10*3/uL (ref 150.0–400.0)
RBC: 4.04 Mil/uL (ref 3.87–5.11)
RDW: 13 % (ref 11.5–15.5)
WBC: 6.5 10*3/uL (ref 4.0–10.5)

## 2022-06-16 LAB — HEMOGLOBIN A1C: Hgb A1c MFr Bld: 5.4 % (ref 4.6–6.5)

## 2022-06-16 NOTE — Assessment & Plan Note (Addendum)
Off of meds, continue off meds for now. Foot exam completed today.  Urine Microalbumin pending.  Commended her on her diet and regular exercise.   Repeat hemoglobin A1C pending.   Follow up in 6 months.  I evaluated patient, was consulted regarding treatment, and agree with assessment and plan per Tinnie Gens, RN, DNP student.   Allie Bossier, NP-C

## 2022-06-16 NOTE — Progress Notes (Signed)
Complete physical exam  Patient: Monica Villanueva   DOB: May 30, 1945   77 y.o. Female  MRN: [id number]  Subjective:    Chief Complaint  Patient presents with   Annual Exam    Not Fasting     Monica Villanueva is a 77 y.o. female who presents today for complete physical and follow up of chronic conditions.  Immunizations: -Tetanus: Due -Influenza: Due -Shingles: Completed Shingrix series -Pneumonia: Completed in 2019, 2020  Diet: [place]. Avoids fried foods and snacks. Eats a lot of protein. Avoids sugary beverages. Exercise: regular exercise. Walks and stretches daily.   Eye exam: Completes annually  Dental exam: Completes semi-annually   Mammogram: Completed in 2023  Colonoscopy: Completed cologuard in 2021, negative Dexa: Completed in 2022  Diabetes: Current medications include: None. She is checking her blood glucose once  daily and is getting readings of 110-115. Last A1C: 5.8 in July, 2023. Last Eye Exam: UTD Last Foot Exam: Due Pneumonia Vaccination: 2019, 2020. Urine Microalbumin: Due Statin: Atorvastatin   Hypertension: Currently not on any medications. She checks her blood pressure at home. Systolic readings are usually [id number] and dystolic readings are usually 80-84s.   Most recent fall risk assessment:    06/16/2022    8:00 AM  [place] in the past year? 0  Number falls in past yr: 0  Injury with Fall? 0  Risk for fall due to : No Fall Risks  Follow up Falls evaluation completed     Most recent depression screenings:    06/16/2022    8:01 AM 01/09/2022   11:39 AM  PHQ 2/9 Scores  PHQ - 2 Score 0 0     Patient Active Problem List   Diagnosis Date Noted   Preventative health care 06/01/2018   Trigger finger 06/01/2018   Hyperlipidemia 11/12/2017   Type 2 diabetes mellitus with hyperglycemia ([place]) 01/21/2017   Essential hypertension 01/21/2017   History of breast cancer 08/11/2016   Medicare annual wellness visit, subsequent 05/13/2016    Morbid obesity with BMI of 40.0-44.9, adult ([place]) 05/13/2016   Past Medical History:  Diagnosis Date   Breast cancer ([place]) 2018   right breast   Cancer ([place])    GERD (gastroesophageal reflux disease)    OCC   Keloid of skin    right axilla   Malignant neoplasm of upper-outer quadrant of right breast in female, estrogen receptor positive ([place]) 08/27/2016   Shingles 01/23/2019   Past Surgical History:  Procedure Laterality Date   ABDOMINAL HYSTERECTOMY  1996   BREAST BIOPSY Right 07/15/2016   stereo bx of calcs LOQ-positive   BREAST BIOPSY Right 07/15/2016   [place] bx of mass-positve   BREAST CYST ASPIRATION Left 07/15/2016   COLONOSCOPY     MASTECTOMY Right 2018   SENTINEL NODE BIOPSY Right 08/11/2016   Procedure: SENTINEL NODE BIOPSY;  Surgeon: Leonie Green, MD;  Location: [facility] ORS;  Service: General;  Laterality: Right;   SIMPLE MASTECTOMY WITH AXILLARY SENTINEL NODE BIOPSY Right 08/11/2016   Procedure: SIMPLE MASTECTOMY;  Surgeon: Leonie Green, MD;  Location: [facility] ORS;  Service: General;  Laterality: Right;   Social History   Tobacco Use   Smoking status: Never   Smokeless tobacco: Never  Vaping Use   Vaping Use: Never used  Substance Use Topics   Alcohol use: Yes    Comment: rare use   Drug use: No   Family History  Problem Relation Age of Onset   Hypertension  Mother    Hypertension Sister    Breast cancer Sister    Breast cancer Sister    No Known Allergies    Patient Care Team: Pleas Koch, NP as PCP - General (Internal Medicine) Leandrew Koyanagi, MD as Referring Physician (Ophthalmology)   Outpatient Medications Prior to Visit  Medication Sig   Accu-Chek FastClix Lancets MISC USE TO CHECK BLOOD GLUCOSE UP TO 4 TIMES DAILY   Alcohol Swabs (DROPSAFE ALCOHOL PREP) 70 % PADS Apply topically.   atorvastatin (LIPITOR) 20 MG tablet Take 1 tablet (20 mg total) by mouth daily. for cholesterol.   calcium carbonate (TUMS - DOSED IN MG ELEMENTAL  CALCIUM) 500 MG chewable tablet Chew 1 tablet by mouth as needed for indigestion or heartburn.   Calcium-Magnesium-Vitamin D (CALCIUM 1200+D3 PO) Take 2 tablets by mouth daily. Calcium 1200 units / Vit D 800 mg   COLLAGEN PO Take 1 capsule by mouth daily.    glucose blood (ACCU-CHEK GUIDE) test strip USE TO CHECK BLOOD GLUCOSE UP TO 4 TIMES DAILY   Krill Oil (OMEGA-3) 500 MG CAPS Take 1 capsule by mouth daily.   OVER THE COUNTER MEDICATION Take 2 capsules by mouth daily at 12 noon. Cholesterol care   Multiple Vitamins-Minerals (HAIR SKIN AND NAILS FORMULA) TABS Take 1 tablet by mouth daily.  (Patient not taking: Reported on 06/16/2022)   No facility-administered medications prior to visit.    Review of Systems  Constitutional:  Negative for chills and fever.  Respiratory:  Negative for cough.   Cardiovascular:  Negative for chest pain.  Gastrointestinal:  Negative for constipation and diarrhea.  Genitourinary:  Negative for dysuria and urgency.  Musculoskeletal:  Negative for myalgias.  Neurological:  Negative for headaches.  Psychiatric/Behavioral:  The patient is not nervous/anxious.           Objective:     BP 134/82   Pulse 61   Temp (!) 97.3 F (36.3 C) (Temporal)   Ht '5\' 1"'$  (1.549 m)   Wt 202 lb (91.6 kg)   SpO2 98%   BMI 38.17 kg/m  BP Readings from Last 3 Encounters:  06/16/22 134/82  01/09/22 137/81  08/05/21 (!) 142/88   Wt Readings from Last 3 Encounters:  06/16/22 202 lb (91.6 kg)  01/09/22 204 lb (92.5 kg)  08/05/21 208 lb (94.3 kg)      Physical Exam Vitals and nursing note reviewed.  Constitutional:      Appearance: Normal appearance.  HENT:     Right Ear: Tympanic membrane, ear canal and external ear normal.     Left Ear: Tympanic membrane, ear canal and external ear normal.  Eyes:     Extraocular Movements: Extraocular movements intact.  Cardiovascular:     Rate and Rhythm: Normal rate and regular rhythm.     Pulses: Normal pulses.      Heart sounds: Normal heart sounds.  Pulmonary:     Effort: Pulmonary effort is normal.     Breath sounds: Normal breath sounds.  Abdominal:     General: Bowel sounds are normal.  Musculoskeletal:        General: Normal range of motion.  Skin:    General: Skin is warm and dry.  Neurological:     Mental Status: She is alert and oriented to person, place, and time.  Psychiatric:        Mood and Affect: Mood normal.        Behavior: Behavior normal.      No  results found for any visits on 06/16/22.      Assessment & Plan:    Routine Health Maintenance and Physical Exam  Immunization History  Administered Date(s) Administered   Fluad Quad(high Dose 65+) 12/21/2018, 06/11/2020   Influenza,inj,Quad PF,6+ Mos 05/13/2016, 05/14/2017, 05/30/2018   PFIZER(Purple Top)SARS-COV-2 Vaccination 06/14/2019, 07/12/2019, 03/11/2020   Pneumococcal Conjugate-13 05/14/2017   Pneumococcal Polysaccharide-23 05/30/2018   Zoster Recombinat (Shingrix) 06/02/2018, 12/21/2018    Health Maintenance  Topic Date Due   INFLUENZA VACCINE  11/18/2021   Diabetic kidney evaluation - Urine ACR  12/10/2021   FOOT EXAM  12/10/2021   HEMOGLOBIN A1C  05/07/2022   Diabetic kidney evaluation - eGFR measurement  06/12/2022   COVID-19 Vaccine (4 - 2023-24 season) 07/02/2022 (Originally 12/19/2021)   DEXA SCAN  07/05/2022   MAMMOGRAM  11/25/2022   OPHTHALMOLOGY EXAM  12/05/2022   Medicare Annual Wellness (AWV)  01/10/2023   Pneumonia Vaccine 29+ Years old  Completed   Hepatitis C Screening  Completed   Zoster Vaccines- Shingrix  Completed   HPV VACCINES  Aged Out   DTaP/Tdap/Td  Discontinued   Fecal DNA (Cologuard)  Discontinued    Discussed health benefits of physical activity, and encouraged her to engage in regular exercise appropriate for her age and condition.  Problem List Items Addressed This Visit       Cardiovascular and Mediastinum   Essential hypertension    Controlled.   Continue off meds  for now.   Continue checking blood pressure at home.    I evaluated patient, was consulted regarding treatment, and agree with assessment and plan per Tinnie Gens, RN, DNP student.   Allie Bossier, NP-C       Relevant Orders   Comprehensive metabolic panel   CBC     Endocrine   Type 2 diabetes mellitus with hyperglycemia (HCC)    Off of meds, continue off meds for now. Foot exam completed today.  Urine Microalbumin pending.  Commended her on her diet and regular exercise.   Repeat hemoglobin A1C pending.   Follow up in 6 months.  I evaluated patient, was consulted regarding treatment, and agree with assessment and plan per Tinnie Gens, RN, DNP student.   Allie Bossier, NP-C       Relevant Orders   Microalbumin/Creatinine Ratio, Urine   Hemoglobin A1c   CBC     Other   History of breast cancer    Following with oncology. Off of meds.   Mammogram UTD.  Bone density scan due and ordered.   I evaluated patient, was consulted regarding treatment, and agree with assessment and plan per Tinnie Gens, RN, DNP student.   Allie Bossier, NP-C       Hyperlipidemia    Repeat Lipid panel pending.   Continue Atorvastatin 20 mg daily.   I evaluated patient, was consulted regarding treatment, and agree with assessment and plan per Tinnie Gens, RN, DNP student.   Allie Bossier, NP-C       Relevant Orders   Lipid panel   Preventative health care - Primary    Influenza vaccine updated today. Other vaccines UTD.  Mammogram up-to-date Bone density - referral placed Colon cancer screening- cologuard order placed.   Encouraged healthy diet and regular exercise, commended on her diet and exercise.   Exam stable today.   Labs pending and reviewed.  I evaluated patient, was consulted regarding treatment, and agree with assessment and plan per Tinnie Gens, RN, DNP student.   Allie Bossier, NP-C  Relevant Orders   Cologuard   Other Visit Diagnoses     Estrogen  deficiency       Relevant Orders   DG Bone Density   Screening for colon cancer       Relevant Orders   Cologuard      Return in about 6 months (around 12/15/2022) for diabetes.     Tinnie Gens, BSN-RN, DNP STUDENT

## 2022-06-16 NOTE — Patient Instructions (Signed)
Stop by the lab prior to leaving today. I will notify you of your results once received.   Referrals have been placed for mammogram, bone density and cologuard.   It is important that you improve your diet. Please limit carbohydrates in the form of white bread, rice, pasta, sweets, fast food, fried food, sugary drinks, etc. Increase your consumption of fresh fruits and vegetables, whole grains, lean protein.  Ensure you are consuming 64 ounces of water daily.  It was a pleasure to see you today!  Follow up in 6 months.

## 2022-06-16 NOTE — Assessment & Plan Note (Addendum)
Controlled.   Continue off meds for now.   Continue checking blood pressure at home.    I evaluated patient, was consulted regarding treatment, and agree with assessment and plan per Tinnie Gens, RN, DNP student.   Allie Bossier, NP-C

## 2022-06-16 NOTE — Assessment & Plan Note (Addendum)
Influenza vaccine updated today. Other vaccines UTD.  Mammogram up-to-date Bone density - referral placed Colon cancer screening- cologuard order placed.   Encouraged healthy diet and regular exercise, commended on her diet and exercise.   Exam stable today.   Labs pending and reviewed.  I evaluated patient, was consulted regarding treatment, and agree with assessment and plan per Tinnie Gens, RN, DNP student.   Allie Bossier, NP-C

## 2022-06-16 NOTE — Assessment & Plan Note (Addendum)
Following with oncology. Off of meds.   Mammogram UTD.  Bone density scan due and ordered.   I evaluated patient, was consulted regarding treatment, and agree with assessment and plan per Tinnie Gens, RN, DNP student.   Allie Bossier, NP-C

## 2022-06-16 NOTE — Progress Notes (Signed)
Subjective:    Patient ID: Monica Villanueva, female    DOB: [birth year], 77 y.o.   MRN: [id number]  HPI  Monica Villanueva is a very pleasant 77 y.o. female who presents today for complete physical and follow up of chronic conditions.  Immunizations: -Influenza: Completed this season -Shingles: Completed Shingrix series -Pneumonia: Completed Prevnar 13 in 2019 and Pneumovax 23 in 2020  Diet: [place]. Has been avoiding sugary drinks and fried foods. Working on portion control.  Exercise: Regular exercise.  Eye exam: Completes annually  Dental exam: Completes semi-annually   Mammogram: Completed in August 2023  Colonoscopy: Completed Cologuard in 2021, due Dexa: Completed in March 2022  BP Readings from Last 3 Encounters:  06/16/22 134/82  01/09/22 137/81  08/05/21 (!) 142/88       Review of Systems  Constitutional:  Negative for unexpected weight change.  HENT:  Negative for rhinorrhea.   Respiratory:  Negative for cough and shortness of breath.   Cardiovascular:  Negative for chest pain.  Gastrointestinal:  Negative for constipation and diarrhea.  Genitourinary:  Negative for difficulty urinating.  Musculoskeletal:  Negative for arthralgias.  Skin:  Negative for rash.  Allergic/Immunologic: Negative for environmental allergies.  Neurological:  Negative for dizziness and headaches.  Psychiatric/Behavioral:  The patient is not nervous/anxious.          Past Medical History:  Diagnosis Date   Breast cancer ([place]) 2018   right breast   Cancer ([place])    GERD (gastroesophageal reflux disease)    OCC   Keloid of skin    right axilla   Malignant neoplasm of upper-outer quadrant of right breast in female, estrogen receptor positive ([place]) 08/27/2016   Shingles 01/23/2019    Social History   Socioeconomic History   Marital status: Widowed    Spouse name: Not on file   Number of children: 2   Years of education: Not on file   Highest education level: Not on file   Occupational History   Occupation: retired  Tobacco Use   Smoking status: Never   Smokeless tobacco: Never  Vaping Use   Vaping Use: Never used  Substance and Sexual Activity   Alcohol use: Yes    Comment: rare use   Drug use: No   Sexual activity: Not Currently  Other Topics Concern   Not on file  Social History Narrative   Widowed.   2 children, 4 grandchildren.   Retired. Previously worked at [employer].   Enjoys writing, playing on the key boards.   Daughter and son in law live with her   Social Determinants of Health   Financial Resource Strain: Low Risk  (01/09/2022)   Overall Financial Resource Strain (CARDIA)    Difficulty of Paying Living Expenses: Not hard at all  Food Insecurity: No Food Insecurity (01/09/2022)   Hunger Vital Sign    Worried About Running Out of Food in the Last Year: Never true    Ran Out of Food in the Last Year: Never true  Transportation Needs: No Transportation Needs (01/09/2022)   PRAPARE - Hydrologist (Medical): No    Lack of Transportation (Non-Medical): No  Physical Activity: Sufficiently Active (01/09/2022)   Exercise Vital Sign    Days of Exercise per Week: 7 days    Minutes of Exercise per Session: 40 min  Stress: No Stress Concern Present (01/09/2022)   [place]  Feeling of Stress : Not at all  Social Connections: Socially Isolated (01/09/2022)   Social Connection and Isolation Panel [NHANES]    Frequency of Communication with Friends and Family: More than three times a week    Frequency of Social Gatherings with Friends and Family: More than three times a week    Attends Religious Services: Never    Marine scientist or Organizations: No    Attends Archivist Meetings: Never    Marital Status: Widowed  Intimate Partner Violence: Not At Risk (01/09/2022)   Humiliation, Afraid, Rape, and Kick  questionnaire    Fear of Current or Ex-Partner: No    Emotionally Abused: No    Physically Abused: No    Sexually Abused: No    Past Surgical History:  Procedure Laterality Date   ABDOMINAL HYSTERECTOMY  1996   BREAST BIOPSY Right 07/15/2016   stereo bx of calcs LOQ-positive   BREAST BIOPSY Right 07/15/2016   [place] bx of mass-positve   BREAST CYST ASPIRATION Left 07/15/2016   COLONOSCOPY     MASTECTOMY Right 2018   SENTINEL NODE BIOPSY Right 08/11/2016   Procedure: SENTINEL NODE BIOPSY;  Surgeon: Leonie Green, MD;  Location: [facility] ORS;  Service: General;  Laterality: Right;   SIMPLE MASTECTOMY WITH AXILLARY SENTINEL NODE BIOPSY Right 08/11/2016   Procedure: SIMPLE MASTECTOMY;  Surgeon: Leonie Green, MD;  Location: [facility] ORS;  Service: General;  Laterality: Right;    Family History  Problem Relation Age of Onset   Hypertension Mother    Hypertension Sister    Breast cancer Sister    Breast cancer Sister     No Known Allergies  Current Outpatient Medications on File Prior to Visit  Medication Sig Dispense Refill   Accu-Chek FastClix Lancets MISC USE TO CHECK BLOOD GLUCOSE UP TO 4 TIMES DAILY 300 each 2   Alcohol Swabs (DROPSAFE ALCOHOL PREP) 70 % PADS Apply topically.     atorvastatin (LIPITOR) 20 MG tablet Take 1 tablet (20 mg total) by mouth daily. for cholesterol. 90 tablet 1   calcium carbonate (TUMS - DOSED IN MG ELEMENTAL CALCIUM) 500 MG chewable tablet Chew 1 tablet by mouth as needed for indigestion or heartburn.     Calcium-Magnesium-Vitamin D (CALCIUM 1200+D3 PO) Take 2 tablets by mouth daily. Calcium 1200 units / Vit D 800 mg     COLLAGEN PO Take 1 capsule by mouth daily.      glucose blood (ACCU-CHEK GUIDE) test strip USE TO CHECK BLOOD GLUCOSE UP TO 4 TIMES DAILY 350 strip 1   Krill Oil (OMEGA-3) 500 MG CAPS Take 1 capsule by mouth daily.     OVER THE COUNTER MEDICATION Take 2 capsules by mouth daily at 12 noon. Cholesterol care     Multiple  Vitamins-Minerals (HAIR SKIN AND NAILS FORMULA) TABS Take 1 tablet by mouth daily.  (Patient not taking: Reported on 06/16/2022)     No current facility-administered medications on file prior to visit.    BP 134/82   Pulse 61   Temp (!) 97.3 F (36.3 C) (Temporal)   Ht '5\' 1"'$  (1.549 m)   Wt 202 lb (91.6 kg)   SpO2 98%   BMI 38.17 kg/m  Objective:   Physical Exam HENT:     Right Ear: Tympanic membrane and ear canal normal.     Left Ear: Tympanic membrane and ear canal normal.     Nose: Nose normal.  Eyes:     Conjunctiva/sclera: Conjunctivae  normal.     Pupils: Pupils are equal, round, and reactive to light.  Neck:     Thyroid: No thyromegaly.  Cardiovascular:     Rate and Rhythm: Normal rate and regular rhythm.     Heart sounds: No murmur heard. Pulmonary:     Effort: Pulmonary effort is normal.     Breath sounds: Normal breath sounds. No rales.  Abdominal:     General: Bowel sounds are normal.     Palpations: Abdomen is soft.     Tenderness: There is no abdominal tenderness.  Musculoskeletal:        General: Normal range of motion.     Cervical back: Neck supple.  Lymphadenopathy:     Cervical: No cervical adenopathy.  Skin:    General: Skin is warm and dry.     Findings: No rash.  Neurological:     Mental Status: She is alert and oriented to person, place, and time.     Cranial Nerves: No cranial nerve deficit.     Deep Tendon Reflexes: Reflexes are normal and symmetric.  Psychiatric:        Mood and Affect: Mood normal.           Assessment & Plan:  Preventative health care Assessment & Plan: Influenza vaccine updated today. Other vaccines UTD.  Mammogram up-to-date Bone density - referral placed Colon cancer screening- cologuard order placed.   Encouraged healthy diet and regular exercise, commended on her diet and exercise.   Exam stable today.   Labs pending and reviewed.  I evaluated patient, was consulted regarding treatment, and agree with  assessment and plan per Tinnie Gens, RN, DNP student.   Allie Bossier, NP-C   Orders: -     Cologuard  Essential hypertension Assessment & Plan: Controlled.   Continue off meds for now.   Continue checking blood pressure at home.    I evaluated patient, was consulted regarding treatment, and agree with assessment and plan per Tinnie Gens, RN, DNP student.   Allie Bossier, NP-C   Orders: -     Comprehensive metabolic panel -     CBC  Hyperlipidemia, unspecified hyperlipidemia type Assessment & Plan: Repeat Lipid panel pending.   Continue Atorvastatin 20 mg daily.   I evaluated patient, was consulted regarding treatment, and agree with assessment and plan per Tinnie Gens, RN, DNP student.   Allie Bossier, NP-C   Orders: -     Lipid panel  History of breast cancer Assessment & Plan: Following with oncology. Off of meds.   Mammogram UTD.  Bone density scan due and ordered.   I evaluated patient, was consulted regarding treatment, and agree with assessment and plan per Tinnie Gens, RN, DNP student.   Allie Bossier, NP-C    Estrogen deficiency -     DG Bone Density; Future  Screening for colon cancer -     Cologuard  Type 2 diabetes mellitus with hyperglycemia, without long-term current use of insulin ([place]) Assessment & Plan: Off of meds, continue off meds for now. Foot exam completed today.  Urine Microalbumin pending.  Commended her on her diet and regular exercise.   Repeat hemoglobin A1C pending.   Follow up in 6 months.  I evaluated patient, was consulted regarding treatment, and agree with assessment and plan per Tinnie Gens, RN, DNP student.   Allie Bossier, NP-C   Orders: -     Microalbumin / creatinine urine ratio -     Hemoglobin A1c -  CBC        Pleas Koch, NP

## 2022-06-16 NOTE — Assessment & Plan Note (Addendum)
Repeat Lipid panel pending.   Continue Atorvastatin 20 mg daily.   I evaluated patient, was consulted regarding treatment, and agree with assessment and plan per Tinnie Gens, RN, DNP student.   Allie Bossier, NP-C

## 2022-06-17 ENCOUNTER — Other Ambulatory Visit: Payer: Self-pay | Admitting: Primary Care

## 2022-06-17 DIAGNOSIS — E785 Hyperlipidemia, unspecified: Secondary | ICD-10-CM

## 2022-06-17 MED ORDER — ATORVASTATIN CALCIUM 20 MG PO TABS: 20.0000 mg | ORAL_TABLET | Freq: Every day | ORAL | 3 refills | Status: DC

## 2022-06-21 DIAGNOSIS — Z1211 Encounter for screening for malignant neoplasm of colon: Secondary | ICD-10-CM | POA: Diagnosis not present

## 2022-06-30 LAB — COLOGUARD: COLOGUARD: NEGATIVE

## 2022-07-23 ENCOUNTER — Telehealth: Payer: Self-pay | Admitting: Primary Care

## 2022-07-23 NOTE — Telephone Encounter (Signed)
Contacted Monica Villanueva to schedule their annual wellness visit. Appointment made for 01/11/2023.  [place] Direct Dial: [phone]

## 2022-08-17 ENCOUNTER — Other Ambulatory Visit (INDEPENDENT_AMBULATORY_CARE_PROVIDER_SITE_OTHER): Payer: Medicare HMO

## 2022-08-17 DIAGNOSIS — E785 Hyperlipidemia, unspecified: Secondary | ICD-10-CM

## 2022-08-18 ENCOUNTER — Telehealth: Payer: Self-pay | Admitting: Primary Care

## 2022-08-18 LAB — LIPID PANEL
Cholesterol: 147 mg/dL (ref 0–200)
HDL: 52.6 mg/dL (ref >39.00)
LDL Cholesterol: 84 mg/dL (ref 0–99)
NonHDL: 94.02
Total CHOL/HDL Ratio: 3
Triglycerides: 49 mg/dL (ref 0.0–149.0)
VLDL: 9.8 mg/dL (ref 0.0–40.0)

## 2022-08-18 NOTE — Telephone Encounter (Signed)
See result message for documentation

## 2022-08-18 NOTE — Telephone Encounter (Signed)
Patient called in returning call she received. Relayed message to patient in regards to her cholesterol. Thank you!

## 2022-08-20 ENCOUNTER — Ambulatory Visit
Admission: RE | Admit: 2022-08-20 | Discharge: 2022-08-20 | Disposition: A | Discharge status: Home / Self Care | Payer: Medicare HMO | Source: Ambulatory Visit | Attending: Primary Care | Admitting: Primary Care

## 2022-08-20 DIAGNOSIS — E2839 Other primary ovarian failure: Secondary | ICD-10-CM | POA: Insufficient documentation

## 2022-08-20 DIAGNOSIS — Z780 Asymptomatic menopausal state: Secondary | ICD-10-CM | POA: Diagnosis not present

## 2022-08-20 DIAGNOSIS — M85851 Other specified disorders of bone density and structure, right thigh: Secondary | ICD-10-CM | POA: Diagnosis not present

## 2022-12-03 ENCOUNTER — Encounter (INDEPENDENT_AMBULATORY_CARE_PROVIDER_SITE_OTHER): Payer: Self-pay

## 2022-12-17 DIAGNOSIS — H18513 Endothelial corneal dystrophy, bilateral: Secondary | ICD-10-CM | POA: Diagnosis not present

## 2022-12-17 DIAGNOSIS — H2513 Age-related nuclear cataract, bilateral: Secondary | ICD-10-CM | POA: Diagnosis not present

## 2022-12-17 DIAGNOSIS — E119 Type 2 diabetes mellitus without complications: Secondary | ICD-10-CM | POA: Diagnosis not present

## 2022-12-17 DIAGNOSIS — H538 Other visual disturbances: Secondary | ICD-10-CM | POA: Diagnosis not present

## 2022-12-17 LAB — HM DIABETES EYE EXAM

## 2023-01-07 ENCOUNTER — Other Ambulatory Visit: Payer: Self-pay | Admitting: Primary Care

## 2023-01-07 DIAGNOSIS — Z1231 Encounter for screening mammogram for malignant neoplasm of breast: Secondary | ICD-10-CM

## 2023-01-11 ENCOUNTER — Ambulatory Visit: Payer: Medicare HMO

## 2023-01-11 VITALS — Ht 61.0 in | Wt 202.0 lb

## 2023-01-11 DIAGNOSIS — Z0000 Encounter for general adult medical examination without abnormal findings: Secondary | ICD-10-CM

## 2023-01-11 NOTE — Progress Notes (Addendum)
Subjective:   Monica Villanueva is a 77 y.o. female who presents for Medicare Annual (Subsequent) preventive examination.  Visit Complete: Virtual  I connected with  Gailen Shelter on 01/11/23 by a audio enabled telemedicine application and verified that I am speaking with the correct person using two identifiers.  Patient Location: Home  Provider Location: Home Office  I discussed the limitations of evaluation and management by telemedicine. The patient expressed understanding and agreed to proceed.  Patient Medicare AWV questionnaire was completed by the patient on 01/11/23; I have confirmed that all information answered by patient is correct and no changes since this date.  Vital Signs: Because this visit was a virtual/telehealth visit, some criteria may be missing or patient reported. Any vitals not documented were not able to be obtained and vitals that have been documented are patient reported.   Cardiac Risk Factors include: advanced age (>51men, >24 women);diabetes mellitus;hypertension     Objective:    Today's Vitals   01/11/23 1355  Weight: 202 lb (91.6 kg)  Height: 5\' 1"  (1.549 m)   Body mass index is 38.17 kg/m.     01/11/2023    1:58 PM 01/09/2022   11:40 AM 12/10/2021    1:38 PM 05/30/2021    1:04 PM 11/25/2020    2:38 PM 06/06/2019   11:12 AM 05/09/2019    9:03 AM  Advanced Directives  Does Patient Have a Medical Advance Directive? No No No No No No No  Would patient like information on creating a medical advance directive? Yes (MAU/Ambulatory/Procedural Areas - Information given) No - Patient declined No - Patient declined   No - Patient declined No - Patient declined    Current Medications (verified) Outpatient Encounter Medications as of 01/11/2023  Medication Sig   Accu-Chek FastClix Lancets MISC USE TO CHECK BLOOD GLUCOSE UP TO 4 TIMES DAILY   Alcohol Swabs (DROPSAFE ALCOHOL PREP) 70 % PADS Apply topically.   atorvastatin (LIPITOR) 20 MG tablet Take 1 tablet  (20 mg total) by mouth daily. for cholesterol.   calcium carbonate (TUMS - DOSED IN MG ELEMENTAL CALCIUM) 500 MG chewable tablet Chew 1 tablet by mouth as needed for indigestion or heartburn.   Calcium-Magnesium-Vitamin D (CALCIUM 1200+D3 PO) Take 2 tablets by mouth daily. Calcium 1200 units / Vit D 800 mg   COLLAGEN PO Take 1 capsule by mouth daily.    glucose blood (ACCU-CHEK GUIDE) test strip USE TO CHECK BLOOD GLUCOSE UP TO 4 TIMES DAILY   Krill Oil (OMEGA-3) 500 MG CAPS Take 1 capsule by mouth daily.   Multiple Vitamins-Minerals (HAIR SKIN AND NAILS FORMULA) TABS Take 1 tablet by mouth daily.   [DISCONTINUED] OVER THE COUNTER MEDICATION Take 2 capsules by mouth daily at 12 noon. Cholesterol care   No facility-administered encounter medications on file as of 01/11/2023.    Allergies (verified) Patient has no known allergies.   History: Past Medical History:  Diagnosis Date   Breast cancer (HCC) 2018   right breast   Cancer (HCC)    GERD (gastroesophageal reflux disease)    OCC   Keloid of skin    right axilla   Malignant neoplasm of upper-outer quadrant of right breast in female, estrogen receptor positive (HCC) 08/27/2016   Shingles 01/23/2019   Past Surgical History:  Procedure Laterality Date   ABDOMINAL HYSTERECTOMY  1996   BREAST BIOPSY Right 07/15/2016   stereo bx of calcs LOQ-positive   BREAST BIOPSY Right 07/15/2016   [place] bx of mass-positve  BREAST CYST ASPIRATION Left 07/15/2016   COLONOSCOPY     MASTECTOMY Right 2018   SENTINEL NODE BIOPSY Right 08/11/2016   Procedure: SENTINEL NODE BIOPSY;  Surgeon: Nadeen Landau, MD;  Location: [facility] ORS;  Service: General;  Laterality: Right;   SIMPLE MASTECTOMY WITH AXILLARY SENTINEL NODE BIOPSY Right 08/11/2016   Procedure: SIMPLE MASTECTOMY;  Surgeon: Nadeen Landau, MD;  Location: [facility] ORS;  Service: General;  Laterality: Right;   Family History  Problem Relation Age of Onset   Hypertension Mother     Hypertension Sister    Breast cancer Sister    Breast cancer Sister    Social History   Socioeconomic History   Marital status: Widowed    Spouse name: Not on file   Number of children: 2   Years of education: Not on file   Highest education level: Not on file  Occupational History   Occupation: retired  Tobacco Use   Smoking status: Never   Smokeless tobacco: Never  Vaping Use   Vaping status: Never Used  Substance and Sexual Activity   Alcohol use: Yes    Comment: rare use   Drug use: No   Sexual activity: Not Currently  Other Topics Concern   Not on file  Social History Narrative   Widowed.   2 children, 4 grandchildren.   Retired. Previously worked at [employer].   Enjoys writing, playing on the key boards.   Daughter and son in law live with her   Social Determinants of Health   Financial Resource Strain: Low Risk  (01/11/2023)   Overall Financial Resource Strain (CARDIA)    Difficulty of Paying Living Expenses: Not hard at all  Food Insecurity: No Food Insecurity (01/11/2023)   Hunger Vital Sign    Worried About Running Out of Food in the Last Year: Never true    Ran Out of Food in the Last Year: Never true  Transportation Needs: No Transportation Needs (01/11/2023)   PRAPARE - Administrator, Civil Service (Medical): No    Lack of Transportation (Non-Medical): No  Physical Activity: Insufficiently Active (01/11/2023)   Exercise Vital Sign    Days of Exercise per Week: 3 days    Minutes of Exercise per Session: 30 min  Stress: No Stress Concern Present (01/11/2023)   [employer] of Occupational Health - Occupational Stress Questionnaire    Feeling of Stress : Only a little  Social Connections: Moderately Isolated (01/11/2023)   Social Connection and Isolation Panel [NHANES]    Frequency of Communication with Friends and Family: Three times a week    Frequency of Social Gatherings with Friends and Family: Once a week     Attends Religious Services: Never    Database administrator or Organizations: Yes    Attends Banker Meetings: 1 to 4 times per year    Marital Status: Widowed    Tobacco Counseling Counseling given: Not Answered   Clinical Intake:  Pre-visit preparation completed: Yes  Pain : No/denies pain     Diabetes: No  How often do you need to have someone help you when you read instructions, pamphlets, or other written materials from your doctor or pharmacy?: 1 - Never  Interpreter Needed?: No  Information entered by :: Kandis Fantasia LPN   Activities of Daily Living    01/11/2023   10:47 AM  In your present state of health, do you have any difficulty performing the following activities:  Hearing? 0  Vision? 0  Difficulty concentrating or making decisions? 0  Walking or climbing stairs? 0  Dressing or bathing? 0  Doing errands, shopping? 0  Preparing Food and eating ? N  Using the Toilet? N  In the past six months, have you accidently leaked urine? N  Do you have problems with loss of bowel control? N  Managing your Medications? N  Managing your Finances? N  Housekeeping or managing your Housekeeping? N    Patient Care Team: Doreene Nest, NP as PCP - General (Internal Medicine) Lockie Mola, MD as Referring Physician (Ophthalmology)  Indicate any recent Medical Services you may have received from other than [facility] providers in the past year (date may be approximate).     Assessment:   This is a routine wellness examination for Sabin.  Hearing/Vision screen Hearing Screening - Comments:: Denies hearing difficulties   Vision Screening - Comments:: Wears rx glasses - up to date with routine eye exams   Goals Addressed   None   Depression Screen    01/11/2023    1:57 PM 06/16/2022    8:01 AM 01/09/2022   11:39 AM 06/12/2021    8:45 AM 01/05/2021   10:12 AM 01/05/2021   10:06 AM 06/11/2020    7:33 AM  PHQ 2/9 Scores  PHQ - 2 Score 0 0 0  0 0 0 0  PHQ- 9 Score    0   2    Fall Risk    01/11/2023    1:58 PM 01/11/2023   10:47 AM 06/16/2022    8:00 AM 01/09/2022   11:34 AM 06/12/2021    8:46 AM  Fall Risk   Falls in the past year? 0 0 0 0 1  Number falls in past yr: 0  0 0 0  Injury with Fall? 0  0 0 0  Risk for fall due to : No Fall Risks  No Fall Risks No Fall Risks   Follow up Falls prevention discussed;Education provided;Falls evaluation completed  Falls evaluation completed Falls prevention discussed Falls evaluation completed    MEDICARE RISK AT HOME: Medicare Risk at Home Any stairs in or around the home?: Yes If so, are there any without handrails?: No Home free of loose throw rugs in walkways, pet beds, electrical cords, etc?: No Adequate lighting in your home to reduce risk of falls?: Yes Life alert?: No Use of a cane, walker or w/c?: No Grab bars in the bathroom?: Yes Shower chair or bench in shower?: No Elevated toilet seat or a handicapped toilet?: No  TIMED UP AND GO:  Was the test performed?  No    Cognitive Function:    06/06/2019   11:15 AM 05/30/2018   10:58 AM 05/14/2017    8:30 AM  MMSE - Mini Mental State Exam  Orientation to time 5 5 5   Orientation to Place 5 5 5   Registration 3 3 3   Attention/ Calculation 5 0 0  Recall 3 3 3   Language- name 2 objects  0 0  Language- repeat 1 1 1   Language- follow 3 step command  3 3  Language- read & follow direction  0 0  Write a sentence  0 0  Copy design  0 0  Total score  20 20        01/11/2023    1:58 PM 01/09/2022   11:42 AM 01/05/2021   10:14 AM  6CIT Screen  What Year? 0 points 0 points 0 points  What month? 0 points 0 points 0 points  What time? 0 points 0 points 0 points  Count back from 20 0 points 0 points 2 points  Months in reverse 0 points 0 points 4 points  Repeat phrase 0 points 0 points 0 points  Total Score 0 points 0 points 6 points    Immunizations Immunization History  Administered Date(s) Administered   Fluad  Quad(high Dose 65+) 12/21/2018, 06/11/2020, 06/16/2022   Influenza,inj,Quad PF,6+ Mos 05/13/2016, 05/14/2017, 05/30/2018   PFIZER(Purple Top)SARS-COV-2 Vaccination 06/14/2019, 07/12/2019, 03/11/2020   Pneumococcal Conjugate-13 05/14/2017   Pneumococcal Polysaccharide-23 05/30/2018   Zoster Recombinant(Shingrix) 06/02/2018, 12/21/2018    TDAP status: Up to date  Flu Vaccine status: Due, Education has been provided regarding the importance of this vaccine. Advised may receive this vaccine at local pharmacy or Health Dept. Aware to provide a copy of the vaccination record if obtained from local pharmacy or Health Dept. Verbalized acceptance and understanding.  Pneumococcal vaccine status: Up to date  Covid-19 vaccine status: Information provided on how to obtain vaccines.   Qualifies for Shingles Vaccine? Yes   Zostavax completed No   Shingrix Completed?: Yes  Screening Tests Health Maintenance  Topic Date Due   FOOT EXAM  12/10/2021   INFLUENZA VACCINE  11/19/2022   MAMMOGRAM  11/25/2022   OPHTHALMOLOGY EXAM  12/05/2022   COVID-19 Vaccine (4 - 2023-24 season) 12/20/2022   HEMOGLOBIN A1C  12/15/2022   Diabetic kidney evaluation - eGFR measurement  06/17/2023   Diabetic kidney evaluation - Urine ACR  06/17/2023   Medicare Annual Wellness (AWV)  01/11/2024   DEXA SCAN  08/19/2024   Pneumonia Vaccine 8+ Years old  Completed   Hepatitis C Screening  Completed   Zoster Vaccines- Shingrix  Completed   HPV VACCINES  Aged Out   DTaP/Tdap/Td  Discontinued   Fecal DNA (Cologuard)  Discontinued    Health Maintenance  Health Maintenance Due  Topic Date Due   FOOT EXAM  12/10/2021   INFLUENZA VACCINE  11/19/2022   MAMMOGRAM  11/25/2022   OPHTHALMOLOGY EXAM  12/05/2022   COVID-19 Vaccine (4 - 2023-24 season) 12/20/2022   HEMOGLOBIN A1C  12/15/2022    Colorectal cancer screening: No longer required.   Mammogram status: Ordered and scheduled for 01/12/23. Pt provided with contact  info and advised to call to schedule appt.   Bone Density status: Completed 08/20/22. Results reflect: Bone density results: OSTEOPENIA. Repeat every 2 years.  Lung Cancer Screening: (Low Dose CT Chest recommended if Age 26-80 years, 20 pack-year currently smoking OR have quit w/in 15years.) does not qualify.   Lung Cancer Screening Referral: n/a  Additional Screening:  Hepatitis C Screening: does qualify; Completed 05/13/16  Vision Screening: Recommended annual ophthalmology exams for early detection of glaucoma and other disorders of the eye. Is the patient up to date with their annual eye exam?  Yes  Who is the provider or what is the name of the office in which the patient attends annual eye exams? Dr. Inez Pilgrim If pt is not established with a provider, would they like to be referred to a provider to establish care? No .   Dental Screening: Recommended annual dental exams for proper oral hygiene  Diabetic Foot Exam: Diabetic Foot Exam: Overdue, Pt has been advised about the importance in completing this exam. Pt is scheduled for diabetic foot exam on at next office visit.  Community Resource Referral / Chronic Care Management: CRR required this visit?  No   CCM required  this visit?  No     Plan:     I have personally reviewed and noted the following in the patient's chart:   Medical and social history Use of alcohol, tobacco or illicit drugs  Current medications and supplements including opioid prescriptions. Patient is not currently taking opioid prescriptions. Functional ability and status Nutritional status Physical activity Advanced directives List of other physicians Hospitalizations, surgeries, and ER visits in previous 12 months Vitals Screenings to include cognitive, depression, and falls Referrals and appointments  In addition, I have reviewed and discussed with patient certain preventive protocols, quality metrics, and best practice recommendations. A written  personalized care plan for preventive services as well as general preventive health recommendations were provided to patient.     Kandis Fantasia [place], [place]   06/03/863   After Visit Summary: (MyChart) Due to this being a telephonic visit, the after visit summary with patients personalized plan was offered to patient via MyChart   Nurse Notes: No concerns at this time

## 2023-01-11 NOTE — Patient Instructions (Signed)
Monica Villanueva , Thank you for taking time to come for your Medicare Wellness Visit. I appreciate your ongoing commitment to your health goals. Please review the following plan we discussed and let me know if I can assist you in the future.   Referrals/Orders/Follow-Ups/Clinician Recommendations: Aim for 30 minutes of exercise or brisk walking, 6-8 glasses of water, and 5 servings of fruits and vegetables each day.  This is a list of the screening recommended for you and due dates:  Health Maintenance  Topic Date Due   Complete foot exam   12/10/2021   Flu Shot  11/19/2022   Mammogram  11/25/2022   Eye exam for diabetics  12/05/2022   COVID-19 Vaccine (4 - 2023-24 season) 12/20/2022   Hemoglobin A1C  12/15/2022   Yearly kidney function blood test for diabetes  06/17/2023   Yearly kidney health urinalysis for diabetes  06/17/2023   Medicare Annual Wellness Visit  01/11/2024   DEXA scan (bone density measurement)  08/19/2024   Pneumonia Vaccine  Completed   Hepatitis C Screening  Completed   Zoster (Shingles) Vaccine  Completed   HPV Vaccine  Aged Out   DTaP/Tdap/Td vaccine  Discontinued   Cologuard (Stool DNA test)  Discontinued    Advanced directives: (ACP Link)Information on Advanced Care Planning can be found at [facility] Advance Health Care Directives Advance Health Care Directives ([email or web address]   Next Medicare Annual Wellness Visit scheduled for next year: Yes

## 2023-01-12 ENCOUNTER — Ambulatory Visit
Admission: RE | Admit: 2023-01-12 | Discharge: 2023-01-12 | Disposition: A | Discharge status: Home / Self Care | Payer: Medicare HMO | Source: Ambulatory Visit | Attending: Primary Care | Admitting: Primary Care

## 2023-01-12 DIAGNOSIS — Z1231 Encounter for screening mammogram for malignant neoplasm of breast: Secondary | ICD-10-CM | POA: Insufficient documentation

## 2023-04-17 ENCOUNTER — Other Ambulatory Visit: Payer: Self-pay | Admitting: Primary Care

## 2023-04-17 DIAGNOSIS — E785 Hyperlipidemia, unspecified: Secondary | ICD-10-CM

## 2023-04-18 NOTE — Telephone Encounter (Signed)
Patient is due for CPE/follow up in early March 2025, this will be required prior to any further refills.  Please schedule, thank you!

## 2023-04-19 NOTE — Telephone Encounter (Signed)
Patient has been scheduled

## 2023-06-22 ENCOUNTER — Encounter: Payer: Self-pay | Admitting: Primary Care

## 2023-06-22 ENCOUNTER — Ambulatory Visit (INDEPENDENT_AMBULATORY_CARE_PROVIDER_SITE_OTHER): Payer: Medicare HMO | Admitting: Primary Care

## 2023-06-22 VITALS — BP 130/76 | HR 65 | Temp 97.8°F | Ht 61.0 in | Wt 213.0 lb

## 2023-06-22 DIAGNOSIS — Z0000 Encounter for general adult medical examination without abnormal findings: Secondary | ICD-10-CM

## 2023-06-22 DIAGNOSIS — E785 Hyperlipidemia, unspecified: Secondary | ICD-10-CM

## 2023-06-22 DIAGNOSIS — Z853 Personal history of malignant neoplasm of breast: Secondary | ICD-10-CM

## 2023-06-22 DIAGNOSIS — I10 Essential (primary) hypertension: Secondary | ICD-10-CM | POA: Diagnosis not present

## 2023-06-22 DIAGNOSIS — E1165 Type 2 diabetes mellitus with hyperglycemia: Secondary | ICD-10-CM | POA: Diagnosis not present

## 2023-06-22 LAB — COMPREHENSIVE METABOLIC PANEL
ALT: 13 U/L (ref 0–35)
AST: 18 U/L (ref 0–37)
Albumin: 4.1 g/dL (ref 3.5–5.2)
Alkaline Phosphatase: 64 U/L (ref 39–117)
BUN: 15 mg/dL (ref 6–23)
CO2: 30 meq/L (ref 19–32)
Calcium: 10 mg/dL (ref 8.4–10.5)
Chloride: 101 meq/L (ref 96–112)
Creatinine, Ser: 0.71 mg/dL (ref 0.40–1.20)
GFR: 81.88 mL/min (ref >60.00)
Glucose, Bld: 165 mg/dL — ABNORMAL HIGH (ref 70–99)
Potassium: 4.1 meq/L (ref 3.5–5.1)
Sodium: 138 meq/L (ref 135–145)
Total Bilirubin: 0.4 mg/dL (ref 0.2–1.2)
Total Protein: 7.8 g/dL (ref 6.0–8.3)

## 2023-06-22 LAB — LIPID PANEL
Cholesterol: 197 mg/dL (ref 0–200)
HDL: 53.5 mg/dL (ref >39.00)
LDL Cholesterol: 125 mg/dL — ABNORMAL HIGH (ref 0–99)
NonHDL: 143.83
Total CHOL/HDL Ratio: 4
Triglycerides: 93 mg/dL (ref 0.0–149.0)
VLDL: 18.6 mg/dL (ref 0.0–40.0)

## 2023-06-22 LAB — HEMOGLOBIN A1C: Hgb A1c MFr Bld: 7.2 % — ABNORMAL HIGH (ref 4.6–6.5)

## 2023-06-22 LAB — MICROALBUMIN / CREATININE URINE RATIO
Creatinine,U: 23.5 mg/dL
Microalb Creat Ratio: 29.8 mg/g (ref 0.0–30.0)
Microalb, Ur: <0.7 mg/dL (ref 0.0–1.9)

## 2023-06-22 LAB — URIC ACID: Uric Acid, Serum: 8.8 mg/dL — ABNORMAL HIGH (ref 2.4–7.0)

## 2023-06-22 NOTE — Assessment & Plan Note (Signed)
 Repeat lipid panel pending. Continue atorvastatin 20 mg daily.

## 2023-06-22 NOTE — Patient Instructions (Addendum)
 Stop by the lab prior to leaving today. I will notify you of your results once received.   It was a pleasure to see you today!

## 2023-06-22 NOTE — Assessment & Plan Note (Signed)
 Immunizations UTD. Bone density scan UTD Mammogram UTD Colonoscopy UTD, no further screening. Negative cologuard in 2024  Discussed the importance of a healthy diet and regular exercise in order for weight loss, and to reduce the risk of further co-morbidity.  Exam stable. Labs pending.  Follow up in 1 year for repeat physical.

## 2023-06-22 NOTE — Progress Notes (Signed)
 Subjective:    Patient ID: Monica Villanueva, female    DOB: 05/06/1945, 78 y.o.   MRN: [id number]  HPI  Monica Villanueva is a very pleasant 78 y.o. female who presents today for complete physical and follow up of chronic conditions.  Immunizations: -Tetanus: Completed in 2024 -Influenza: Completed this season  -Shingles: Completed Shingrix series  -Pneumonia: Completed Prevnar 20 2024  Diet: Fair diet.  Exercise: No regular exercise.  Eye exam: Completes annually  Dental exam: Completes semi-annually    Mammogram: Completed in September 2024 Bone Density Scan: Completed in May 2024  Colonoscopy: Completed cologuard in 2024, negative.   BP Readings from Last 3 Encounters:  06/22/23 130/76  06/16/22 134/82  01/09/22 137/81         Review of Systems  Constitutional:  Negative for unexpected weight change.  HENT:  Negative for rhinorrhea.   Respiratory:  Negative for cough and shortness of breath.   Cardiovascular:  Negative for chest pain.  Gastrointestinal:  Negative for constipation and diarrhea.  Genitourinary:  Negative for difficulty urinating.  Musculoskeletal:  Positive for arthralgias. Negative for myalgias.       Left great toe pain  Skin:  Negative for rash.  Allergic/Immunologic: Negative for environmental allergies.  Neurological:  Negative for dizziness, numbness and headaches.         Past Medical History:  Diagnosis Date   Breast cancer (HCC) 2018   right breast   Cancer (HCC)    GERD (gastroesophageal reflux disease)    OCC   Keloid of skin    right axilla   Malignant neoplasm of upper-outer quadrant of right breast in female, estrogen receptor positive (HCC) 08/27/2016   Shingles 01/23/2019    Social History   Socioeconomic History   Marital status: Widowed    Spouse name: Not on file   Number of children: 2   Years of education: Not on file   Highest education level: GED or equivalent  Occupational History   Occupation: retired   Tobacco Use   Smoking status: Never   Smokeless tobacco: Never  Vaping Use   Vaping status: Never Used  Substance and Sexual Activity   Alcohol use: Yes    Comment: rare use   Drug use: No   Sexual activity: Not Currently  Other Topics Concern   Not on file  Social History Narrative   Widowed.   2 children, 4 grandchildren.   Retired. Previously worked at [employer].   Enjoys writing, playing on the key boards.   Daughter and son in law live with her   Social Drivers of Health   Financial Resource Strain: Low Risk  (06/18/2023)   Overall Financial Resource Strain (CARDIA)    Difficulty of Paying Living Expenses: Not hard at all  Food Insecurity: No Food Insecurity (06/18/2023)   Hunger Vital Sign    Worried About Running Out of Food in the Last Year: Never true    Ran Out of Food in the Last Year: Never true  Transportation Needs: No Transportation Needs (06/18/2023)   PRAPARE - Administrator, Civil Service (Medical): No    Lack of Transportation (Non-Medical): No  Physical Activity: Insufficiently Active (06/18/2023)   Exercise Vital Sign    Days of Exercise per Week: 3 days    Minutes of Exercise per Session: 30 min  Stress: No Stress Concern Present (06/18/2023)   [employer] of Occupational Health - Occupational Stress Questionnaire    Feeling  of Stress : Not at all  Social Connections: Moderately Integrated (06/18/2023)   Social Connection and Isolation Panel [NHANES]    Frequency of Communication with Friends and Family: More than three times a week    Frequency of Social Gatherings with Friends and Family: Twice a week    Attends Religious Services: 1 to 4 times per year    Active Member of [employer] or Organizations: No    Attends Banker Meetings: 1 to 4 times per year    Marital Status: Widowed  Intimate Partner Violence: Not At Risk (01/09/2022)   Humiliation, Afraid, Rape, and Kick questionnaire    Fear of Current or  Ex-Partner: No    Emotionally Abused: No    Physically Abused: No    Sexually Abused: No    Past Surgical History:  Procedure Laterality Date   ABDOMINAL HYSTERECTOMY  1996   BREAST BIOPSY Right 07/15/2016   stereo bx of calcs LOQ-positive   BREAST BIOPSY Right 07/15/2016   [place] bx of mass-positve   BREAST CYST ASPIRATION Left 07/15/2016   COLONOSCOPY     MASTECTOMY Right 2018   SENTINEL NODE BIOPSY Right 08/11/2016   Procedure: SENTINEL NODE BIOPSY;  Surgeon: Nadeen Landau, MD;  Location: [facility];  Service: General;  Laterality: Right;   SIMPLE MASTECTOMY WITH AXILLARY SENTINEL NODE BIOPSY Right 08/11/2016   Procedure: SIMPLE MASTECTOMY;  Surgeon: Nadeen Landau, MD;  Location: [facility];  Service: General;  Laterality: Right;    Family History  Problem Relation Age of Onset   Hypertension Mother    Hypertension Sister    Breast cancer Sister    Breast cancer Sister     No Known Allergies  Current Outpatient Medications on File Prior to Visit  Medication Sig Dispense Refill   Accu-Chek FastClix Lancets MISC USE TO CHECK BLOOD GLUCOSE UP TO 4 TIMES DAILY 300 each 2   Alcohol Swabs (DROPSAFE ALCOHOL PREP) 70 % PADS Apply topically.     atorvastatin (LIPITOR) 20 MG tablet Take 1 tablet (20 mg total) by mouth daily. for cholesterol. 90 tablet 3   calcium carbonate (TUMS - DOSED IN MG ELEMENTAL CALCIUM) 500 MG chewable tablet Chew 1 tablet by mouth as needed for indigestion or heartburn.     Calcium-Magnesium-Vitamin D (CALCIUM 1200+D3 PO) Take 2 tablets by mouth daily. Calcium 1200 units / Vit D 800 mg     COLLAGEN PO Take 1 capsule by mouth daily.      glucose blood (ACCU-CHEK GUIDE) test strip USE TO CHECK BLOOD GLUCOSE UP TO 4 TIMES DAILY 350 strip 1   Krill Oil (OMEGA-3) 500 MG CAPS Take 1 capsule by mouth daily.     Multiple Vitamins-Minerals (HAIR SKIN AND NAILS FORMULA) TABS Take 1 tablet by mouth daily. (Patient not taking: Reported on 06/22/2023)     No current  facility-administered medications on file prior to visit.    BP 130/76   Pulse 65   Temp 97.8 F (36.6 C) (Temporal)   Ht 5\' 1"  (1.549 m)   Wt 213 lb (96.6 kg)   SpO2 98%   BMI 40.25 kg/m  Objective:   Physical Exam HENT:     Right Ear: Tympanic membrane and ear canal normal.     Left Ear: Tympanic membrane and ear canal normal.  Eyes:     Pupils: Pupils are equal, round, and reactive to light.  Cardiovascular:     Rate and Rhythm: Normal rate and regular rhythm.  Pulmonary:     Effort: Pulmonary effort is normal.     Breath sounds: Normal breath sounds.  Abdominal:     General: Bowel sounds are normal.     Palpations: Abdomen is soft.     Tenderness: There is no abdominal tenderness.  Musculoskeletal:        General: Normal range of motion.     Cervical back: Neck supple.  Skin:    General: Skin is warm and dry.  Neurological:     Mental Status: She is alert and oriented to person, place, and time.     Cranial Nerves: No cranial nerve deficit.     Deep Tendon Reflexes:     Reflex Scores:      Patellar reflexes are 2+ on the right side and 2+ on the left side. Psychiatric:        Mood and Affect: Mood normal.           Assessment & Plan:  Preventative health care Assessment & Plan: Immunizations UTD. Bone density scan UTD Mammogram UTD Colonoscopy UTD, no further screening. Negative cologuard in 2024  Discussed the importance of a healthy diet and regular exercise in order for weight loss, and to reduce the risk of further co-morbidity.  Exam stable. Labs pending.  Follow up in 1 year for repeat physical.    Essential hypertension Assessment & Plan: Controlled.  Continue off medication.  Continue to monitor.   Orders: -     Uric acid -     Comprehensive metabolic panel  Type 2 diabetes mellitus with hyperglycemia, without long-term current use of insulin (HCC) Assessment & Plan: Repeat A1C pending. Remain off treatment.   Urine  microalbumin due and pending. Pneumonia vaccine UTD   Orders: -     Microalbumin / creatinine urine ratio -     Uric acid -     Hemoglobin A1c  History of breast cancer Assessment & Plan: Mammogram UTD. Continue annual screenings.   Hyperlipidemia, unspecified hyperlipidemia type Assessment & Plan: Repeat lipid panel pending.  Continue atorvastatin 20 mg daily  Orders: -     Lipid panel        Doreene Nest, NP

## 2023-06-22 NOTE — Assessment & Plan Note (Signed)
 Repeat A1C pending. Remain off treatment.   Urine microalbumin due and pending. Pneumonia vaccine UTD

## 2023-06-22 NOTE — Assessment & Plan Note (Signed)
Mammogram UTD.  Continue annual screenings.  

## 2023-06-22 NOTE — Assessment & Plan Note (Signed)
Controlled.   Continue off medication. Continue to monitor.

## 2023-06-23 ENCOUNTER — Other Ambulatory Visit: Payer: Self-pay | Admitting: Primary Care

## 2023-06-23 DIAGNOSIS — M1A9XX0 Chronic gout, unspecified, without tophus (tophi): Secondary | ICD-10-CM

## 2023-06-23 DIAGNOSIS — E785 Hyperlipidemia, unspecified: Secondary | ICD-10-CM

## 2023-06-23 MED ORDER — ATORVASTATIN CALCIUM 20 MG PO TABS
20.0000 mg | ORAL_TABLET | Freq: Every day | ORAL | 3 refills | Status: AC
Start: 2023-06-23 — End: ?

## 2023-06-23 MED ORDER — PREDNISONE 20 MG PO TABS
ORAL_TABLET | ORAL | 0 refills | Status: DC
Start: 2023-06-23 — End: 2023-09-23

## 2023-09-23 ENCOUNTER — Encounter: Payer: Self-pay | Admitting: Primary Care

## 2023-09-23 ENCOUNTER — Ambulatory Visit: Payer: Self-pay | Admitting: Primary Care

## 2023-09-23 ENCOUNTER — Ambulatory Visit (INDEPENDENT_AMBULATORY_CARE_PROVIDER_SITE_OTHER): Admitting: Primary Care

## 2023-09-23 VITALS — BP 128/76 | HR 69 | Temp 97.0°F | Ht 61.0 in | Wt 207.0 lb

## 2023-09-23 DIAGNOSIS — E1165 Type 2 diabetes mellitus with hyperglycemia: Secondary | ICD-10-CM | POA: Diagnosis not present

## 2023-09-23 LAB — POCT GLYCOSYLATED HEMOGLOBIN (HGB A1C): Hemoglobin A1C: 6.9 % — AB (ref 4.0–5.6)

## 2023-09-23 NOTE — Progress Notes (Signed)
 Subjective:    Patient ID: Monica Villanueva, female    DOB: 02-Feb-1946, 78 y.o.   MRN: [id number]  HPI  Monica Villanueva is a very pleasant 78 y.o. female with a history of type 2 diabetes, hypertension, hyperlipidemia who presents today for follow-up of diabetes.  Current medications include: None  She is checking her blood glucose 1-2 times daily and is getting readings of:  AM fasting: 150s Before dinner: 110s  Last A1C: 7.2 in March 2025, 6.9 today  Last Eye Exam: UTD Last Foot Exam: Due Pneumonia Vaccination: 2020 Urine Microalbumin: UTD Statin: atorvastatin    Dietary changes since last visit: Increased water intake. Increased intake of protein, making healthier choices.    Exercise: daily   BP Readings from Last 3 Encounters:  09/23/23 128/76  06/22/23 130/76  06/16/22 134/82   Wt Readings from Last 3 Encounters:  09/23/23 207 lb (93.9 kg)  06/22/23 213 lb (96.6 kg)  01/11/23 202 lb (91.6 kg)       Review of Systems  Respiratory:  Negative for shortness of breath.   Cardiovascular:  Negative for chest pain.  Endocrine: Negative for polydipsia, polyphagia and polyuria.  Neurological:  Negative for dizziness and numbness.         Past Medical History:  Diagnosis Date   Breast cancer (HCC) 2018   right breast   Cancer (HCC)    GERD (gastroesophageal reflux disease)    OCC   Keloid of skin    right axilla   Malignant neoplasm of upper-outer quadrant of right breast in female, estrogen receptor positive (HCC) 08/27/2016   Shingles 01/23/2019    Social History   Socioeconomic History   Marital status: Widowed    Spouse name: Not on file   Number of children: 2   Years of education: Not on file   Highest education level: GED or equivalent  Occupational History   Occupation: retired  Tobacco Use   Smoking status: Never   Smokeless tobacco: Never  Vaping Use   Vaping status: Never Used  Substance and Sexual Activity   Alcohol use: Yes    Comment:  rare use   Drug use: No   Sexual activity: Not Currently  Other Topics Concern   Not on file  Social History Narrative   Widowed.   2 children, 4 grandchildren.   Retired. Previously worked at [employer].   Enjoys writing, playing on the key boards.   Daughter and son in law live with her   Social Drivers of Health   Financial Resource Strain: Low Risk  (06/18/2023)   Overall Financial Resource Strain (CARDIA)    Difficulty of Paying Living Expenses: Not hard at all  Food Insecurity: No Food Insecurity (06/18/2023)   Hunger Vital Sign    Worried About Running Out of Food in the Last Year: Never true    Ran Out of Food in the Last Year: Never true  Transportation Needs: No Transportation Needs (06/18/2023)   PRAPARE - Administrator, Civil Service (Medical): No    Lack of Transportation (Non-Medical): No  Physical Activity: Insufficiently Active (06/18/2023)   Exercise Vital Sign    Days of Exercise per Week: 3 days    Minutes of Exercise per Session: 30 min  Stress: No Stress Concern Present (06/18/2023)   [employer] of Occupational Health - Occupational Stress Questionnaire    Feeling of Stress : Not at all  Social Connections: Moderately Integrated (06/18/2023)   Social  Connection and Isolation Panel [NHANES]    Frequency of Communication with Friends and Family: More than three times a week    Frequency of Social Gatherings with Friends and Family: Twice a week    Attends Religious Services: 1 to 4 times per year    Active Member of [employer] or Organizations: No    Attends Banker Meetings: 1 to 4 times per year    Marital Status: Widowed  Intimate Partner Violence: Not At Risk (01/09/2022)   Humiliation, Afraid, Rape, and Kick questionnaire    Fear of Current or Ex-Partner: No    Emotionally Abused: No    Physically Abused: No    Sexually Abused: No    Past Surgical History:  Procedure Laterality Date   ABDOMINAL  HYSTERECTOMY  1996   BREAST BIOPSY Right 07/15/2016   stereo bx of calcs LOQ-positive   BREAST BIOPSY Right 07/15/2016   us  bx of mass-positve   BREAST CYST ASPIRATION Left 07/15/2016   COLONOSCOPY     MASTECTOMY Right 2018   SENTINEL NODE BIOPSY Right 08/11/2016   Procedure: SENTINEL NODE BIOPSY;  Surgeon: Benancio Bracket, MD;  Location: [facility];  Service: General;  Laterality: Right;   SIMPLE MASTECTOMY WITH AXILLARY SENTINEL NODE BIOPSY Right 08/11/2016   Procedure: SIMPLE MASTECTOMY;  Surgeon: Benancio Bracket, MD;  Location: [facility];  Service: General;  Laterality: Right;    Family History  Problem Relation Age of Onset   Hypertension Mother    Hypertension Sister    Breast cancer Sister    Breast cancer Sister     No Known Allergies  Current Outpatient Medications on File Prior to Visit  Medication Sig Dispense Refill   Accu-Chek FastClix Lancets MISC USE TO CHECK BLOOD GLUCOSE UP TO 4 TIMES DAILY 300 each 2   Alcohol Swabs (DROPSAFE ALCOHOL PREP) 70 % PADS Apply topically.     atorvastatin  (LIPITOR) 20 MG tablet Take 1 tablet (20 mg total) by mouth daily. for cholesterol. 90 tablet 3   calcium  carbonate (TUMS - DOSED IN MG ELEMENTAL CALCIUM ) 500 MG chewable tablet Chew 1 tablet by mouth as needed for indigestion or heartburn.     Calcium -Magnesium -Vitamin D  (CALCIUM  1200+D3 PO) Take 2 tablets by mouth daily. Calcium  1200 units / Vit D 800 mg     COLLAGEN PO Take 1 capsule by mouth daily.      glucose blood (ACCU-CHEK GUIDE) test strip USE TO CHECK BLOOD GLUCOSE UP TO 4 TIMES DAILY 350 strip 1   Krill Oil (OMEGA-3) 500 MG CAPS Take 1 capsule by mouth daily.     Multiple Vitamins-Minerals (HAIR SKIN AND NAILS FORMULA) TABS Take 1 tablet by mouth daily.     No current facility-administered medications on file prior to visit.    BP 128/76   Pulse 69   Temp (!) 97 F (36.1 C) (Temporal)   Ht 5\' 1"  (1.549 m)   Wt 207 lb (93.9 kg)   SpO2 97%   BMI 39.11 kg/m   Objective:   Physical Exam Cardiovascular:     Rate and Rhythm: Normal rate and regular rhythm.  Pulmonary:     Effort: Pulmonary effort is normal.     Breath sounds: Normal breath sounds.  Musculoskeletal:     Cervical back: Neck supple.  Skin:    General: Skin is warm and dry.  Neurological:     Mental Status: She is alert and oriented to person, place, and time.  Psychiatric:  Mood and Affect: Mood normal.           Assessment & Plan:  Type 2 diabetes mellitus with hyperglycemia, without long-term current use of insulin (HCC) Assessment & Plan: Improved with A1C of 6.9 today!  Commended her on weight loss and dietary changes, encouraged to continue.  Remain off medications. Foot exam today.  Follow-up in 6 months  Orders: -     POCT glycosylated hemoglobin (Hb A1C)        Gabriel John, NP

## 2023-09-23 NOTE — Patient Instructions (Signed)
Please schedule a follow up visit for 6 months for a diabetes check.  It was a pleasure to see you today!

## 2023-09-23 NOTE — Assessment & Plan Note (Signed)
 Improved with A1C of 6.9 today!  Commended her on weight loss and dietary changes, encouraged to continue.  Remain off medications. Foot exam today.  Follow-up in 6 months

## 2023-12-24 DIAGNOSIS — H2513 Age-related nuclear cataract, bilateral: Secondary | ICD-10-CM | POA: Diagnosis not present

## 2023-12-24 DIAGNOSIS — E119 Type 2 diabetes mellitus without complications: Secondary | ICD-10-CM | POA: Diagnosis not present

## 2023-12-24 DIAGNOSIS — H18513 Endothelial corneal dystrophy, bilateral: Secondary | ICD-10-CM | POA: Diagnosis not present

## 2023-12-24 DIAGNOSIS — Z0100 Encounter for examination of eyes and vision without abnormal findings: Secondary | ICD-10-CM | POA: Diagnosis not present

## 2023-12-24 LAB — HM DIABETES EYE EXAM

## 2023-12-28 ENCOUNTER — Encounter: Payer: Self-pay | Admitting: Primary Care

## 2024-01-14 ENCOUNTER — Ambulatory Visit: Payer: Medicare HMO

## 2024-01-14 VITALS — Ht 61.0 in | Wt 207.0 lb

## 2024-01-14 DIAGNOSIS — Z1231 Encounter for screening mammogram for malignant neoplasm of breast: Secondary | ICD-10-CM

## 2024-01-14 DIAGNOSIS — Z0000 Encounter for general adult medical examination without abnormal findings: Secondary | ICD-10-CM

## 2024-01-14 NOTE — Patient Instructions (Signed)
 Monica Villanueva,  Thank you for taking the time for your Medicare Wellness Visit. I appreciate your continued commitment to your health goals. Please review the care plan we discussed, and feel free to reach out if I can assist you further.  Medicare recommends these wellness visits once per year to help you and your care team stay ahead of potential health issues. These visits are designed to focus on prevention, allowing your provider to concentrate on managing your acute and chronic conditions during your regular appointments.  Please note that Annual Wellness Visits do not include a physical exam. Some assessments may be limited, especially if the visit was conducted virtually. If needed, we may recommend a separate in-person follow-up with your provider.  Ongoing Care Seeing your primary care provider every 3 to 6 months helps us  monitor your health and provide consistent, personalized care.   Referrals If a referral was made during today's visit and you haven't received any updates within two weeks, please contact the referred provider directly to check on the status.  Recommended Screenings:  Health Maintenance  Topic Date Due   Flu Shot  11/19/2023   COVID-19 Vaccine (5 - 2025-26 season) 12/20/2023   Breast Cancer Screening  01/12/2024   Hemoglobin A1C  03/24/2024   Yearly kidney function blood test for diabetes  06/21/2024   Yearly kidney health urinalysis for diabetes  06/21/2024   DEXA scan (bone density measurement)  08/19/2024   Complete foot exam   09/22/2024   Eye exam for diabetics  12/23/2024   Medicare Annual Wellness Visit  01/13/2025   Pneumococcal Vaccine for age over 7  Completed   Hepatitis C Screening  Completed   Zoster (Shingles) Vaccine  Completed   HPV Vaccine  Aged Out   Meningitis B Vaccine  Aged Out   DTaP/Tdap/Td vaccine  Discontinued   Cologuard (Stool DNA test)  Discontinued       01/11/2023    1:58 PM  Advanced Directives  Does Patient Have a  Medical Advance Directive? No  Would patient like information on creating a medical advance directive? Yes (MAU/Ambulatory/Procedural Areas - Information given)   Advance Care Planning is important because it: Ensures you receive medical care that aligns with your values, goals, and preferences. Provides guidance to your family and loved ones, reducing the emotional burden of decision-making during critical moments.  Vision: Annual vision screenings are recommended for early detection of glaucoma, cataracts, and diabetic retinopathy. These exams can also reveal signs of chronic conditions such as diabetes and high blood pressure.  Dental: Annual dental screenings help detect early signs of oral cancer, gum disease, and other conditions linked to overall health, including heart disease and diabetes.  Please see the attached documents for additional preventive care recommendations.

## 2024-01-14 NOTE — Progress Notes (Signed)
 Please attest and cosign this visit due to patients primary care provider not being in the office at the time the visit was completed.    Subjective:   Monica Villanueva is a 78 y.o. who presents for a Medicare Wellness preventive visit.  As a reminder, Annual Wellness Visits don't include a physical exam, and some assessments may be limited, especially if this visit is performed virtually. We may recommend an in-person follow-up visit with your provider if needed.  Visit Complete: Virtual I connected with  Meade Daring on 01/14/24 by a audio enabled telemedicine application and verified that I am speaking with the correct person using two identifiers.  Patient Location: Home  Provider Location: Home Office  I discussed the limitations of evaluation and management by telemedicine. The patient expressed understanding and agreed to proceed.  Vital Signs: Because this visit was a virtual/telehealth visit, some criteria may be missing or patient reported. Any vitals not documented were not able to be obtained and vitals that have been documented are patient reported.  VideoError- Librarian, academic were attempted between this provider and patient, however failed, due to patient having technical difficulties OR patient did not have access to video capability.  We continued and completed visit with audio only.   Persons Participating in Visit: Patient.  AWV Questionnaire: Yes: Patient Medicare AWV questionnaire was completed by the patient on 01/10/24; I have confirmed that all information answered by patient is correct and no changes since this date.  Cardiac Risk Factors include: advanced age (>51men, >1 women);diabetes mellitus;dyslipidemia;hypertension;obesity (BMI >30kg/m2)     Objective:    Today's Vitals   01/14/24 1018  Weight: 207 lb (93.9 kg)  Height: 5' 1 (1.549 m)   Body mass index is 39.11 kg/m.     01/14/2024   10:25 AM 01/11/2023    1:58 PM  01/09/2022   11:40 AM 12/10/2021    1:38 PM 05/30/2021    1:04 PM 11/25/2020    2:38 PM 06/06/2019   11:12 AM  Advanced Directives  Does Patient Have a Medical Advance Directive? No No No No No No No  Would patient like information on creating a medical advance directive?  Yes (MAU/Ambulatory/Procedural Areas - Information given) No - Patient declined No - Patient declined   No - Patient declined    Current Medications (verified) Outpatient Encounter Medications as of 01/14/2024  Medication Sig   Accu-Chek FastClix Lancets MISC USE TO CHECK BLOOD GLUCOSE UP TO 4 TIMES DAILY   Alcohol Swabs (DROPSAFE ALCOHOL PREP) 70 % PADS Apply topically.   atorvastatin  (LIPITOR) 20 MG tablet Take 1 tablet (20 mg total) by mouth daily. for cholesterol.   calcium  carbonate (TUMS - DOSED IN MG ELEMENTAL CALCIUM ) 500 MG chewable tablet Chew 1 tablet by mouth as needed for indigestion or heartburn.   Calcium -Magnesium -Vitamin D  (CALCIUM  1200+D3 PO) Take 2 tablets by mouth daily. Calcium  1200 units / Vit D 800 mg   COLLAGEN PO Take 1 capsule by mouth daily.    glucose blood (ACCU-CHEK GUIDE) test strip USE TO CHECK BLOOD GLUCOSE UP TO 4 TIMES DAILY   Krill Oil (OMEGA-3) 500 MG CAPS Take 1 capsule by mouth daily.   Multiple Vitamins-Minerals (HAIR SKIN AND NAILS FORMULA) TABS Take 1 tablet by mouth daily.   No facility-administered encounter medications on file as of 01/14/2024.    Allergies (verified) Patient has no known allergies.   History: Past Medical History:  Diagnosis Date   Breast cancer (HCC)  2018   right breast   Cancer (HCC)    GERD (gastroesophageal reflux disease)    OCC   Keloid of skin    right axilla   Malignant neoplasm of upper-outer quadrant of right breast in female, estrogen receptor positive (HCC) 08/27/2016   Shingles 01/23/2019   Past Surgical History:  Procedure Laterality Date   ABDOMINAL HYSTERECTOMY  1996   BREAST BIOPSY Right 07/15/2016   stereo bx of calcs LOQ-positive    BREAST BIOPSY Right 07/15/2016   us  bx of mass-positve   BREAST CYST ASPIRATION Left 07/15/2016   COLONOSCOPY     MASTECTOMY Right 2018   SENTINEL NODE BIOPSY Right 08/11/2016   Procedure: SENTINEL NODE BIOPSY;  Surgeon: Larinda Unknown Sharps, MD;  Location: [facility];  Service: General;  Laterality: Right;   SIMPLE MASTECTOMY WITH AXILLARY SENTINEL NODE BIOPSY Right 08/11/2016   Procedure: SIMPLE MASTECTOMY;  Surgeon: Larinda Unknown Sharps, MD;  Location: [facility];  Service: General;  Laterality: Right;   Family History  Problem Relation Age of Onset   Hypertension Mother    Hypertension Sister    Breast cancer Sister    Breast cancer Sister    Social History   Socioeconomic History   Marital status: Widowed    Spouse name: Not on file   Number of children: 2   Years of education: Not on file   Highest education level: GED or equivalent  Occupational History   Occupation: retired  Tobacco Use   Smoking status: Never   Smokeless tobacco: Never  Vaping Use   Vaping status: Never Used  Substance and Sexual Activity   Alcohol use: Yes    Comment: rare use   Drug use: No   Sexual activity: Not Currently  Other Topics Concern   Not on file  Social History Narrative   Widowed.   2 children, 4 grandchildren.   Retired. Previously worked at [employer].   Enjoys writing, playing on the key boards.   Daughter and son in law live with her   Social Drivers of Health   Financial Resource Strain: Low Risk  (01/10/2024)   Overall Financial Resource Strain (CARDIA)    Difficulty of Paying Living Expenses: Not hard at all  Food Insecurity: No Food Insecurity (01/10/2024)   Hunger Vital Sign    Worried About Running Out of Food in the Last Year: Never true    Ran Out of Food in the Last Year: Never true  Transportation Needs: No Transportation Needs (01/10/2024)   PRAPARE - Administrator, Civil Service (Medical): No    Lack of Transportation  (Non-Medical): No  Physical Activity: Sufficiently Active (01/10/2024)   Exercise Vital Sign    Days of Exercise per Week: 5 days    Minutes of Exercise per Session: 30 min  Stress: No Stress Concern Present (01/10/2024)   [employer] of Occupational Health - Occupational Stress Questionnaire    Feeling of Stress: Not at all  Social Connections: Moderately Isolated (01/10/2024)   Social Connection and Isolation Panel    Frequency of Communication with Friends and Family: More than three times a week    Frequency of Social Gatherings with Friends and Family: Twice a week    Attends Religious Services: 1 to 4 times per year    Active Member of [employer] or Organizations: No    Attends Banker Meetings: Not on file    Marital Status: Widowed    Tobacco Counseling  Counseling given: Not Answered  Clinical Intake:  Pre-visit preparation completed: Yes  Pain : No/denies pain   BMI - recorded: 39.11 Nutritional Status: BMI > 30  Obese Nutritional Risks: None Diabetes: Yes CBG done?: No Did pt. bring in CBG monitor from home?: No  Lab Results  Component Value Date   HGBA1C 6.9 (A) 09/23/2023   HGBA1C 7.2 (H) 06/22/2023   HGBA1C 5.4 06/16/2022     How often do you need to have someone help you when you read instructions, pamphlets, or other written materials from your doctor or pharmacy?: 1 - Never  Interpreter Needed?: No  Comments: lives with daughter and son in law w/pt Information entered by :: B.Fraida Veldman,LPN   Activities of Daily Living     01/10/2024    2:39 PM  In your present state of health, do you have any difficulty performing the following activities:  Hearing? 0  Vision? 0  Difficulty concentrating or making decisions? 0  Walking or climbing stairs? 0  Dressing or bathing? 0  Doing errands, shopping? 0  Preparing Food and eating ? N  Using the Toilet? N  In the past six months, have you accidently leaked urine? N  Do you have problems with  loss of bowel control? N  Managing your Medications? N  Managing your Finances? N  Housekeeping or managing your Housekeeping? N    Patient Care Team: Gretta Comer POUR, NP as PCP - General (Internal Medicine) Mittie Gaskin, MD as Referring Physician (Ophthalmology)  I have updated your Care Teams any recent Medical Services you may have received from other providers in the past year.     Assessment:   This is a routine wellness examination for [employer].  Hearing/Vision screen Hearing Screening - Comments:: Patient denies any hearing difficulties.   Vision Screening - Comments:: Pt says their vision is good with glasses Dr  Mittie   Goals Addressed             This Visit's Progress    Patient Stated   On track    01/09/22 - I will continue to exercise everyday for about 30 minutes; continue to travel     Patient Stated       Stay active and healthy       Depression Screen     01/14/2024   10:23 AM 09/23/2023   10:31 AM 06/22/2023   10:57 AM 01/11/2023    1:57 PM 06/16/2022    8:01 AM 01/09/2022   11:39 AM 06/12/2021    8:45 AM  PHQ 2/9 Scores  PHQ - 2 Score 0 0 0 0 0 0 0  PHQ- 9 Score       0    Fall Risk     01/10/2024    2:39 PM 09/23/2023   10:31 AM 06/22/2023   10:57 AM 01/11/2023    1:58 PM 01/11/2023   10:47 AM  Fall Risk   Falls in the past year? 0 0 0 0 0  Number falls in past yr:  0 0 0   Injury with Fall?  0 0 0   Risk for fall due to : No Fall Risks No Fall Risks No Fall Risks No Fall Risks   Follow up Education provided Falls evaluation completed Falls evaluation completed Falls prevention discussed;Education provided;Falls evaluation completed     MEDICARE RISK AT HOME:  Medicare Risk at Home Any stairs in or around the home?: (Patient-Rptd) Yes If so, are there any without handrails?: (  Patient-Rptd) No Home free of loose throw rugs in walkways, pet beds, electrical cords, etc?: (Patient-Rptd) Yes Adequate lighting in your home to reduce  risk of falls?: (Patient-Rptd) Yes Life alert?: (Patient-Rptd) No Use of a cane, walker or w/c?: (Patient-Rptd) No Grab bars in the bathroom?: (Patient-Rptd) Yes Shower chair or bench in shower?: (Patient-Rptd) No Elevated toilet seat or a handicapped toilet?: (Patient-Rptd) No  TIMED UP AND GO:  Was the test performed?  No  Cognitive Function: 6CIT completed    06/06/2019   11:15 AM 05/30/2018   10:58 AM 05/14/2017    8:30 AM  MMSE - Mini Mental State Exam  Orientation to time 5 5 5    Orientation to Place 5 5 5    Registration 3 3 3    Attention/ Calculation 5 0 0   Recall 3 3 3    Language- name 2 objects  0 0   Language- repeat 1 1 1   Language- follow 3 step command  3 3   Language- read & follow direction  0 0   Write a sentence  0 0   Copy design  0 0   Total score  20 20      Data saved with a previous flowsheet row definition        01/14/2024   10:28 AM 01/11/2023    1:58 PM 01/09/2022   11:42 AM 01/05/2021   10:14 AM  6CIT Screen  What Year? 0 points 0 points 0 points 0 points  What month? 0 points 0 points 0 points 0 points  What time? 0 points 0 points 0 points 0 points  Count back from 20 0 points 0 points 0 points 2 points  Months in reverse 0 points 0 points 0 points 4 points  Repeat phrase 0 points 0 points 0 points 0 points  Total Score 0 points 0 points 0 points 6 points    Immunizations Immunization History  Administered Date(s) Administered   Fluad Quad(high Dose 65+) 12/21/2018, 06/11/2020, 06/16/2022   INFLUENZA, HIGH DOSE SEASONAL PF 03/31/2023   Influenza,inj,Quad PF,6+ Mos 05/13/2016, 05/14/2017, 05/30/2018   PFIZER(Purple Top)SARS-COV-2 Vaccination 06/14/2019, 07/12/2019, 03/11/2020, 08/16/2020   PNEUMOCOCCAL CONJUGATE-20 03/31/2023   Pneumococcal Conjugate-13 05/14/2017   Pneumococcal Polysaccharide-23 05/30/2018   Tdap 06/24/2022   Zoster Recombinant(Shingrix ) 06/02/2018, 12/21/2018    Screening Tests Health Maintenance  Topic Date  Due   Influenza Vaccine  11/19/2023   COVID-19 Vaccine (5 - 2025-26 season) 12/20/2023   Mammogram  01/12/2024   HEMOGLOBIN A1C  03/24/2024   Diabetic kidney evaluation - eGFR measurement  06/21/2024   Diabetic kidney evaluation - Urine ACR  06/21/2024   DEXA SCAN  08/19/2024   FOOT EXAM  09/22/2024   OPHTHALMOLOGY EXAM  12/23/2024   Medicare Annual Wellness (AWV)  01/13/2025   Pneumococcal Vaccine: 50+ Years  Completed   Hepatitis C Screening  Completed   Zoster Vaccines- Shingrix   Completed   HPV VACCINES  Aged Out   Meningococcal B Vaccine  Aged Out   DTaP/Tdap/Td  Discontinued   Fecal DNA (Cologuard)  Discontinued    Health Maintenance Items Addressed: None due at this time. Pt will receive vaccines at their pharmcy when decided to obtain   Additional Screening:  Vision Screening: Recommended annual ophthalmology exams for early detection of glaucoma and other disorders of the eye. Is the patient up to date with their annual eye exam?  Yes  Who is the provider or what is the name of the office in which  the patient attends annual eye exams? Dr Mittie  Dental Screening: Recommended annual dental exams for proper oral hygiene  Community Resource Referral / Chronic Care Management: CRR required this visit?  No   CCM required this visit?  No   Plan:    I have personally reviewed and noted the following in the patient's chart:   Medical and social history Use of alcohol, tobacco or illicit drugs  Current medications and supplements including opioid prescriptions. Patient is not currently taking opioid prescriptions. Functional ability and status Nutritional status Physical activity Advanced directives List of other physicians Hospitalizations, surgeries, and ER visits in previous 12 months Vitals Screenings to include cognitive, depression, and falls Referrals and appointments  In addition, I have reviewed and discussed with patient certain preventive  protocols, quality metrics, and best practice recommendations. A written personalized care plan for preventive services as well as general preventive health recommendations were provided to patient.   Erminio LITTIE Saris, LPN   [id number]   After Visit Summary: (MyChart) Due to this being a telephonic visit, the after visit summary with patients personalized plan was offered to patient via MyChart   Notes: Nothing significant to report at this time.

## 2024-03-24 ENCOUNTER — Ambulatory Visit: Payer: Self-pay | Admitting: Primary Care

## 2024-03-24 ENCOUNTER — Ambulatory Visit: Admitting: Primary Care

## 2024-03-24 ENCOUNTER — Encounter: Payer: Self-pay | Admitting: Primary Care

## 2024-03-24 VITALS — BP 122/82 | HR 77 | Temp 97.2°F | Ht 61.0 in | Wt 209.0 lb

## 2024-03-24 DIAGNOSIS — Z23 Encounter for immunization: Secondary | ICD-10-CM

## 2024-03-24 DIAGNOSIS — E1165 Type 2 diabetes mellitus with hyperglycemia: Secondary | ICD-10-CM

## 2024-03-24 LAB — POCT GLYCOSYLATED HEMOGLOBIN (HGB A1C): Hemoglobin A1C: 6.7 % — AB (ref 4.0–5.6)

## 2024-03-24 NOTE — Progress Notes (Signed)
 Subjective:    Patient ID: Monica Villanueva, female    DOB: December 21, 1945, 78 y.o.   MRN: [id number]  Monica Villanueva is a very pleasant 78 y.o. female with a history of hypertension, type 2 diabetes, breast cancer, hyperlipidemia who presents today for follow-up of diabetes.  1) Type 2 Diabetes:  Current medications include: None  She is checking her blood glucose 1 times daily and is getting readings of:  AM fasting: 120-150  Last A1C: 6.9 in March 2025, 6.7 today  Last Eye Exam: UTD Last Foot Exam: UTD Pneumonia Vaccination: 2024 Urine Microalbumin: UTD Statin: atorvastatin    Dietary changes since last visit: She has increased intake of fish and overall high protein. She is limiting bread.    Exercise: Walking      Review of Systems  Eyes:  Negative for visual disturbance.  Respiratory:  Negative for shortness of breath.   Cardiovascular:  Negative for chest pain.  Neurological:  Negative for numbness.         Past Medical History:  Diagnosis Date   Breast cancer (HCC) 2018   right breast   Cancer (HCC)    GERD (gastroesophageal reflux disease)    OCC   Keloid of skin    right axilla   Malignant neoplasm of upper-outer quadrant of right breast in female, estrogen receptor positive (HCC) 08/27/2016   Shingles 01/23/2019    Social History   Socioeconomic History   Marital status: Widowed    Spouse name: Not on file   Number of children: 2   Years of education: Not on file   Highest education level: GED or equivalent  Occupational History   Occupation: retired  Tobacco Use   Smoking status: Never   Smokeless tobacco: Never  Vaping Use   Vaping status: Never Used  Substance and Sexual Activity   Alcohol use: Yes    Comment: rare use   Drug use: No   Sexual activity: Not Currently  Other Topics Concern   Not on file  Social History Narrative   Widowed.   2 children, 4 grandchildren.   Retired. Previously worked at [employer].    Enjoys writing, playing on the key boards.   Daughter and son in law live with her   Social Drivers of Health   Financial Resource Strain: Low Risk  (03/20/2024)   Overall Financial Resource Strain (CARDIA)    Difficulty of Paying Living Expenses: Not hard at all  Food Insecurity: No Food Insecurity (03/20/2024)   Hunger Vital Sign    Worried About Running Out of Food in the Last Year: Never true    Ran Out of Food in the Last Year: Never true  Transportation Needs: No Transportation Needs (03/20/2024)   PRAPARE - Administrator, Civil Service (Medical): No    Lack of Transportation (Non-Medical): No  Physical Activity: Insufficiently Active (03/20/2024)   Exercise Vital Sign    Days of Exercise per Week: 3 days    Minutes of Exercise per Session: 30 min  Stress: No Stress Concern Present (03/20/2024)   [employer] of Occupational Health - Occupational Stress Questionnaire    Feeling of Stress: Not at all  Social Connections: Moderately Isolated (03/20/2024)   Social Connection and Isolation Panel    Frequency of Communication with Friends and Family: Twice a week    Frequency of Social Gatherings with Friends and Family: Once a week    Attends Religious Services: 1 to 4 times per  year    Active Member of Clubs or Organizations: No    Attends Banker Meetings: Not on file    Marital Status: Widowed  Intimate Partner Violence: Not At Risk (01/14/2024)   Humiliation, Afraid, Rape, and Kick questionnaire    Fear of Current or Ex-Partner: No    Emotionally Abused: No    Physically Abused: No    Sexually Abused: No    Past Surgical History:  Procedure Laterality Date   ABDOMINAL HYSTERECTOMY  1996   BREAST BIOPSY Right 07/15/2016   stereo bx of calcs LOQ-positive   BREAST BIOPSY Right 07/15/2016   us  bx of mass-positve   BREAST CYST ASPIRATION Left 07/15/2016   COLONOSCOPY     MASTECTOMY Right 2018   SENTINEL NODE BIOPSY Right 08/11/2016   Procedure:  SENTINEL NODE BIOPSY;  Surgeon: Larinda Unknown Sharps, MD;  Location: [facility];  Service: General;  Laterality: Right;   SIMPLE MASTECTOMY WITH AXILLARY SENTINEL NODE BIOPSY Right 08/11/2016   Procedure: SIMPLE MASTECTOMY;  Surgeon: Larinda Unknown Sharps, MD;  Location: [facility];  Service: General;  Laterality: Right;    Family History  Problem Relation Age of Onset   Hypertension Mother    Hypertension Sister    Breast cancer Sister    Breast cancer Sister     No Known Allergies  Current Outpatient Medications on File Prior to Visit  Medication Sig Dispense Refill   Accu-Chek FastClix Lancets MISC USE TO CHECK BLOOD GLUCOSE UP TO 4 TIMES DAILY 300 each 2   Alcohol Swabs (DROPSAFE ALCOHOL PREP) 70 % PADS Apply topically.     atorvastatin  (LIPITOR) 20 MG tablet Take 1 tablet (20 mg total) by mouth daily. for cholesterol. 90 tablet 3   calcium  carbonate (TUMS - DOSED IN MG ELEMENTAL CALCIUM ) 500 MG chewable tablet Chew 1 tablet by mouth as needed for indigestion or heartburn.     Calcium -Magnesium -Vitamin D  (CALCIUM  1200+D3 PO) Take 2 tablets by mouth daily. Calcium  1200 units / Vit D 800 mg     COLLAGEN PO Take 1 capsule by mouth daily.      glucose blood (ACCU-CHEK GUIDE) test strip USE TO CHECK BLOOD GLUCOSE UP TO 4 TIMES DAILY 350 strip 1   Krill Oil (OMEGA-3) 500 MG CAPS Take 1 capsule by mouth daily.     Multiple Vitamins-Minerals (HAIR SKIN AND NAILS FORMULA) TABS Take 1 tablet by mouth daily.     No current facility-administered medications on file prior to visit.    BP 122/82   Pulse 77   Temp (!) 97.2 F (36.2 C) (Temporal)   Ht 5' 1 (1.549 m)   Wt 209 lb (94.8 kg)   SpO2 96%   BMI 39.49 kg/m  Objective:   Physical Exam Cardiovascular:     Rate and Rhythm: Normal rate and regular rhythm.  Pulmonary:     Effort: Pulmonary effort is normal.     Breath sounds: Normal breath sounds.  Musculoskeletal:     Cervical back: Neck supple.  Skin:    General: Skin is warm and  dry.  Neurological:     Mental Status: She is alert and oriented to person, place, and time.  Psychiatric:        Mood and Affect: Mood normal.     Physical Exam        Assessment & Plan:  Type 2 diabetes mellitus with hyperglycemia, without long-term current use of insulin (HCC) Assessment & Plan: Improved with A1C of 6.7  today.  Continue off treatment. Commended her on lifestyle changes  Will plan to see her back in 3 months for her CPE  Orders: -     POCT glycosylated hemoglobin (Hb A1C)  Encounter for immunization -     Flu vaccine HIGH DOSE PF(Fluzone Trivalent)    Assessment and Plan Assessment & Plan         Comer MARLA Gaskins, NP

## 2024-03-24 NOTE — Assessment & Plan Note (Signed)
 Improved with A1C of 6.7 today.  Continue off treatment. Commended her on lifestyle changes  Will plan to see her back in 3 months for her CPE

## 2024-03-24 NOTE — Patient Instructions (Signed)
 Keep up the good work!  We will see you in 3 months for your physical.   It was a pleasure to see you today!

## 2024-04-12 ENCOUNTER — Other Ambulatory Visit: Payer: Self-pay | Admitting: Primary Care

## 2024-04-12 DIAGNOSIS — E785 Hyperlipidemia, unspecified: Secondary | ICD-10-CM

## 2024-06-23 ENCOUNTER — Encounter: Admitting: Primary Care

## 2025-01-15 ENCOUNTER — Ambulatory Visit
